# Patient Record
Sex: Female | Born: 1960 | Race: White | Hispanic: No | Smoking: Former smoker
Health system: Southern US, Community
[De-identification: ages and names within clinical notes are randomized; demographics above are authoritative.]

## PROBLEM LIST (undated history)

## (undated) DIAGNOSIS — J449 Chronic obstructive pulmonary disease, unspecified: Secondary | ICD-10-CM

## (undated) DIAGNOSIS — K219 Gastro-esophageal reflux disease without esophagitis: Secondary | ICD-10-CM

## (undated) DIAGNOSIS — M810 Age-related osteoporosis without current pathological fracture: Secondary | ICD-10-CM

## (undated) DIAGNOSIS — M1990 Unspecified osteoarthritis, unspecified site: Secondary | ICD-10-CM

## (undated) DIAGNOSIS — G894 Chronic pain syndrome: Secondary | ICD-10-CM

## (undated) DIAGNOSIS — J302 Other seasonal allergic rhinitis: Secondary | ICD-10-CM

## (undated) DIAGNOSIS — K5090 Crohn's disease, unspecified, without complications: Secondary | ICD-10-CM

## (undated) DIAGNOSIS — D649 Anemia, unspecified: Secondary | ICD-10-CM

## (undated) DIAGNOSIS — K859 Acute pancreatitis without necrosis or infection, unspecified: Secondary | ICD-10-CM

## (undated) DIAGNOSIS — F419 Anxiety disorder, unspecified: Secondary | ICD-10-CM

## (undated) DIAGNOSIS — K625 Hemorrhage of anus and rectum: Secondary | ICD-10-CM

## (undated) DIAGNOSIS — R197 Diarrhea, unspecified: Secondary | ICD-10-CM

## (undated) DIAGNOSIS — Z5189 Encounter for other specified aftercare: Secondary | ICD-10-CM

## (undated) DIAGNOSIS — R1030 Lower abdominal pain, unspecified: Secondary | ICD-10-CM

## (undated) DIAGNOSIS — R1110 Vomiting, unspecified: Secondary | ICD-10-CM

## (undated) DIAGNOSIS — R630 Anorexia: Secondary | ICD-10-CM

## (undated) HISTORY — DX: Anorexia: R63.0

## (undated) HISTORY — DX: Age-related osteoporosis without current pathological fracture: M81.0

## (undated) HISTORY — DX: Chronic pain syndrome: G89.4

## (undated) HISTORY — PX: ABDOMINAL HYSTERECTOMY: SHX81

## (undated) HISTORY — DX: Unspecified osteoarthritis, unspecified site: M19.90

## (undated) HISTORY — PX: SMALL INTESTINE SURGERY: SHX150

## (undated) HISTORY — PX: OTHER SURGICAL HISTORY: SHX169

## (undated) HISTORY — DX: Diarrhea, unspecified: R19.7

## (undated) HISTORY — PX: TUBAL LIGATION: SHX77

## (undated) HISTORY — PX: CHOLECYSTECTOMY: SHX55

## (undated) HISTORY — DX: Anemia, unspecified: D64.9

## (undated) HISTORY — DX: Hemorrhage of anus and rectum: K62.5

## (undated) HISTORY — DX: Encounter for other specified aftercare: Z51.89

## (undated) HISTORY — DX: Gastro-esophageal reflux disease without esophagitis: K21.9

## (undated) HISTORY — PX: HERNIA REPAIR: SHX51

## (undated) HISTORY — DX: Lower abdominal pain, unspecified: R10.30

## (undated) HISTORY — DX: Crohn's disease, unspecified, without complications: K50.90

## (undated) HISTORY — DX: Anxiety disorder, unspecified: F41.9

## (undated) HISTORY — DX: Vomiting, unspecified: R11.10

---

## 1993-04-19 HISTORY — PX: COLECTOMY: SHX59

## 2001-01-25 ENCOUNTER — Emergency Department (HOSPITAL_COMMUNITY): Admission: EM | Admit: 2001-01-25 | Discharge: 2001-01-26 | Payer: Self-pay | Admitting: *Deleted

## 2001-01-26 ENCOUNTER — Encounter: Payer: Self-pay | Admitting: *Deleted

## 2001-02-06 ENCOUNTER — Ambulatory Visit (HOSPITAL_BASED_OUTPATIENT_CLINIC_OR_DEPARTMENT_OTHER): Admission: RE | Admit: 2001-02-06 | Discharge: 2001-02-06 | Payer: Self-pay | Admitting: Otolaryngology

## 2001-04-07 ENCOUNTER — Encounter: Payer: Self-pay | Admitting: Emergency Medicine

## 2001-04-07 ENCOUNTER — Inpatient Hospital Stay (HOSPITAL_COMMUNITY): Admission: EM | Admit: 2001-04-07 | Discharge: 2001-04-14 | Payer: Self-pay | Admitting: Emergency Medicine

## 2001-04-09 ENCOUNTER — Encounter: Payer: Self-pay | Admitting: General Surgery

## 2001-04-10 ENCOUNTER — Encounter: Payer: Self-pay | Admitting: General Surgery

## 2001-04-14 ENCOUNTER — Encounter: Payer: Self-pay | Admitting: General Surgery

## 2001-11-23 ENCOUNTER — Inpatient Hospital Stay (HOSPITAL_COMMUNITY): Admission: AD | Admit: 2001-11-23 | Discharge: 2001-11-30 | Payer: Self-pay | Admitting: General Surgery

## 2001-11-24 ENCOUNTER — Encounter: Payer: Self-pay | Admitting: General Surgery

## 2001-11-27 ENCOUNTER — Encounter: Payer: Self-pay | Admitting: General Surgery

## 2002-08-21 ENCOUNTER — Encounter: Payer: Self-pay | Admitting: General Surgery

## 2002-08-21 ENCOUNTER — Encounter: Payer: Self-pay | Admitting: Emergency Medicine

## 2002-08-21 ENCOUNTER — Inpatient Hospital Stay (HOSPITAL_COMMUNITY): Admission: EM | Admit: 2002-08-21 | Discharge: 2002-08-27 | Payer: Self-pay | Admitting: Emergency Medicine

## 2002-10-02 ENCOUNTER — Inpatient Hospital Stay (HOSPITAL_COMMUNITY): Admission: EM | Admit: 2002-10-02 | Discharge: 2002-10-09 | Payer: Self-pay | Admitting: Emergency Medicine

## 2002-10-04 ENCOUNTER — Encounter: Payer: Self-pay | Admitting: General Surgery

## 2002-10-08 ENCOUNTER — Encounter: Payer: Self-pay | Admitting: General Surgery

## 2003-01-15 ENCOUNTER — Encounter: Payer: Self-pay | Admitting: General Surgery

## 2003-01-15 ENCOUNTER — Ambulatory Visit (HOSPITAL_COMMUNITY): Admission: RE | Admit: 2003-01-15 | Discharge: 2003-01-15 | Payer: Self-pay | Admitting: General Surgery

## 2004-03-15 ENCOUNTER — Emergency Department (HOSPITAL_COMMUNITY): Admission: EM | Admit: 2004-03-15 | Discharge: 2004-03-15 | Payer: Self-pay | Admitting: Emergency Medicine

## 2004-04-18 ENCOUNTER — Emergency Department (HOSPITAL_COMMUNITY): Admission: EM | Admit: 2004-04-18 | Discharge: 2004-04-18 | Payer: Self-pay | Admitting: Emergency Medicine

## 2004-05-13 ENCOUNTER — Emergency Department (HOSPITAL_COMMUNITY): Admission: EM | Admit: 2004-05-13 | Discharge: 2004-05-13 | Payer: Self-pay | Admitting: Emergency Medicine

## 2004-05-14 ENCOUNTER — Inpatient Hospital Stay (HOSPITAL_COMMUNITY): Admission: EM | Admit: 2004-05-14 | Discharge: 2004-05-21 | Payer: Self-pay | Admitting: Emergency Medicine

## 2004-05-19 ENCOUNTER — Ambulatory Visit: Payer: Self-pay | Admitting: *Deleted

## 2005-11-29 ENCOUNTER — Ambulatory Visit: Payer: Self-pay | Admitting: Family Medicine

## 2005-11-29 LAB — CONVERTED CEMR LAB
TSH: 1.869 microintl units/mL
WBC, blood: 8.3 10*3/uL

## 2005-12-08 ENCOUNTER — Observation Stay (HOSPITAL_COMMUNITY): Admission: RE | Admit: 2005-12-08 | Discharge: 2005-12-10 | Payer: Self-pay | Admitting: General Surgery

## 2005-12-13 ENCOUNTER — Ambulatory Visit: Payer: Self-pay | Admitting: Family Medicine

## 2005-12-14 ENCOUNTER — Ambulatory Visit (HOSPITAL_COMMUNITY): Admission: RE | Admit: 2005-12-14 | Discharge: 2005-12-14 | Payer: Self-pay | Admitting: Family Medicine

## 2005-12-15 ENCOUNTER — Ambulatory Visit (HOSPITAL_COMMUNITY): Admission: RE | Admit: 2005-12-15 | Discharge: 2005-12-15 | Payer: Self-pay | Admitting: Family Medicine

## 2005-12-16 ENCOUNTER — Encounter (HOSPITAL_COMMUNITY): Admission: RE | Admit: 2005-12-16 | Discharge: 2006-01-14 | Payer: Self-pay | Admitting: Family Medicine

## 2005-12-17 ENCOUNTER — Encounter (INDEPENDENT_AMBULATORY_CARE_PROVIDER_SITE_OTHER): Payer: Self-pay | Admitting: Family Medicine

## 2005-12-17 LAB — CONVERTED CEMR LAB: RBC count: 4.31 10*6/uL

## 2005-12-28 ENCOUNTER — Ambulatory Visit: Payer: Self-pay | Admitting: Family Medicine

## 2005-12-30 ENCOUNTER — Ambulatory Visit: Payer: Self-pay | Admitting: Internal Medicine

## 2006-01-03 ENCOUNTER — Ambulatory Visit (HOSPITAL_COMMUNITY): Admission: RE | Admit: 2006-01-03 | Discharge: 2006-01-03 | Payer: Self-pay | Admitting: Internal Medicine

## 2006-01-03 ENCOUNTER — Encounter (INDEPENDENT_AMBULATORY_CARE_PROVIDER_SITE_OTHER): Payer: Self-pay | Admitting: Family Medicine

## 2006-01-03 ENCOUNTER — Ambulatory Visit (HOSPITAL_COMMUNITY): Admission: RE | Admit: 2006-01-03 | Discharge: 2006-01-03 | Payer: Self-pay | Admitting: Family Medicine

## 2006-01-09 ENCOUNTER — Emergency Department (HOSPITAL_COMMUNITY): Admission: EM | Admit: 2006-01-09 | Discharge: 2006-01-09 | Payer: Self-pay | Admitting: Emergency Medicine

## 2006-01-13 ENCOUNTER — Ambulatory Visit: Payer: Self-pay | Admitting: Family Medicine

## 2006-01-17 HISTORY — PX: COLONOSCOPY: SHX174

## 2006-01-21 ENCOUNTER — Ambulatory Visit (HOSPITAL_COMMUNITY): Admission: RE | Admit: 2006-01-21 | Discharge: 2006-01-21 | Payer: Self-pay | Admitting: Internal Medicine

## 2006-01-21 ENCOUNTER — Encounter (INDEPENDENT_AMBULATORY_CARE_PROVIDER_SITE_OTHER): Payer: Self-pay | Admitting: Specialist

## 2006-01-21 ENCOUNTER — Ambulatory Visit: Payer: Self-pay | Admitting: Internal Medicine

## 2006-02-10 ENCOUNTER — Encounter (INDEPENDENT_AMBULATORY_CARE_PROVIDER_SITE_OTHER): Payer: Self-pay | Admitting: Family Medicine

## 2006-02-15 ENCOUNTER — Ambulatory Visit: Payer: Self-pay | Admitting: Internal Medicine

## 2006-02-20 ENCOUNTER — Emergency Department (HOSPITAL_COMMUNITY): Admission: EM | Admit: 2006-02-20 | Discharge: 2006-02-20 | Payer: Self-pay | Admitting: Emergency Medicine

## 2006-03-24 ENCOUNTER — Ambulatory Visit: Payer: Self-pay | Admitting: Internal Medicine

## 2006-03-24 ENCOUNTER — Encounter: Payer: Self-pay | Admitting: Family Medicine

## 2006-03-24 DIAGNOSIS — K449 Diaphragmatic hernia without obstruction or gangrene: Secondary | ICD-10-CM | POA: Insufficient documentation

## 2006-03-24 DIAGNOSIS — M949 Disorder of cartilage, unspecified: Secondary | ICD-10-CM

## 2006-03-24 DIAGNOSIS — M899 Disorder of bone, unspecified: Secondary | ICD-10-CM | POA: Insufficient documentation

## 2006-03-24 DIAGNOSIS — K219 Gastro-esophageal reflux disease without esophagitis: Secondary | ICD-10-CM | POA: Insufficient documentation

## 2006-03-24 DIAGNOSIS — Z8719 Personal history of other diseases of the digestive system: Secondary | ICD-10-CM | POA: Insufficient documentation

## 2006-03-24 DIAGNOSIS — G5600 Carpal tunnel syndrome, unspecified upper limb: Secondary | ICD-10-CM | POA: Insufficient documentation

## 2006-03-24 DIAGNOSIS — K5090 Crohn's disease, unspecified, without complications: Secondary | ICD-10-CM | POA: Insufficient documentation

## 2006-03-24 DIAGNOSIS — M545 Low back pain, unspecified: Secondary | ICD-10-CM | POA: Insufficient documentation

## 2006-03-24 DIAGNOSIS — K589 Irritable bowel syndrome without diarrhea: Secondary | ICD-10-CM | POA: Insufficient documentation

## 2006-03-24 DIAGNOSIS — M129 Arthropathy, unspecified: Secondary | ICD-10-CM | POA: Insufficient documentation

## 2006-03-24 DIAGNOSIS — F17200 Nicotine dependence, unspecified, uncomplicated: Secondary | ICD-10-CM | POA: Insufficient documentation

## 2006-03-31 ENCOUNTER — Encounter (INDEPENDENT_AMBULATORY_CARE_PROVIDER_SITE_OTHER): Payer: Self-pay | Admitting: Family Medicine

## 2006-05-04 ENCOUNTER — Ambulatory Visit: Payer: Self-pay | Admitting: Internal Medicine

## 2006-05-25 ENCOUNTER — Ambulatory Visit: Payer: Self-pay | Admitting: Internal Medicine

## 2006-08-25 ENCOUNTER — Encounter
Admission: RE | Admit: 2006-08-25 | Discharge: 2006-11-23 | Payer: Self-pay | Admitting: Physical Medicine & Rehabilitation

## 2006-08-25 ENCOUNTER — Ambulatory Visit: Payer: Self-pay | Admitting: Physical Medicine & Rehabilitation

## 2006-08-29 ENCOUNTER — Ambulatory Visit (HOSPITAL_COMMUNITY)
Admission: RE | Admit: 2006-08-29 | Discharge: 2006-08-29 | Payer: Self-pay | Admitting: Physical Medicine & Rehabilitation

## 2006-09-26 ENCOUNTER — Ambulatory Visit: Payer: Self-pay | Admitting: Physical Medicine & Rehabilitation

## 2006-11-02 ENCOUNTER — Encounter
Admission: RE | Admit: 2006-11-02 | Discharge: 2007-01-31 | Payer: Self-pay | Admitting: Physical Medicine & Rehabilitation

## 2006-11-03 ENCOUNTER — Ambulatory Visit: Payer: Self-pay | Admitting: Physical Medicine & Rehabilitation

## 2007-01-02 ENCOUNTER — Ambulatory Visit: Payer: Self-pay | Admitting: Physical Medicine & Rehabilitation

## 2007-02-02 ENCOUNTER — Ambulatory Visit: Payer: Self-pay | Admitting: Physical Medicine & Rehabilitation

## 2007-02-02 ENCOUNTER — Encounter
Admission: RE | Admit: 2007-02-02 | Discharge: 2007-05-03 | Payer: Self-pay | Admitting: Physical Medicine & Rehabilitation

## 2007-02-17 ENCOUNTER — Ambulatory Visit: Payer: Self-pay | Admitting: Physical Medicine & Rehabilitation

## 2007-04-11 ENCOUNTER — Ambulatory Visit: Payer: Self-pay | Admitting: Physical Medicine & Rehabilitation

## 2007-04-11 ENCOUNTER — Encounter
Admission: RE | Admit: 2007-04-11 | Discharge: 2007-06-29 | Payer: Self-pay | Admitting: Physical Medicine & Rehabilitation

## 2007-05-19 ENCOUNTER — Encounter
Admission: RE | Admit: 2007-05-19 | Discharge: 2007-05-25 | Payer: Self-pay | Admitting: Physical Medicine & Rehabilitation

## 2007-05-19 ENCOUNTER — Ambulatory Visit: Payer: Self-pay | Admitting: Physical Medicine & Rehabilitation

## 2007-05-24 ENCOUNTER — Ambulatory Visit: Payer: Self-pay | Admitting: Physical Medicine & Rehabilitation

## 2007-08-12 ENCOUNTER — Emergency Department (HOSPITAL_COMMUNITY): Admission: EM | Admit: 2007-08-12 | Discharge: 2007-08-12 | Payer: Self-pay | Admitting: Emergency Medicine

## 2008-05-08 ENCOUNTER — Ambulatory Visit (HOSPITAL_COMMUNITY): Admission: RE | Admit: 2008-05-08 | Discharge: 2008-05-08 | Payer: Self-pay | Admitting: Pulmonary Disease

## 2009-06-13 ENCOUNTER — Ambulatory Visit (HOSPITAL_COMMUNITY): Admission: RE | Admit: 2009-06-13 | Discharge: 2009-06-13 | Payer: Self-pay | Admitting: Internal Medicine

## 2010-01-08 LAB — BASIC METABOLIC PANEL: Creatinine: 0.7 mg/dL (ref 0.5–1.1)

## 2010-01-08 LAB — CBC AND DIFFERENTIAL
HCT: 38 % (ref 36–46)
Hemoglobin: 13.3 g/dL (ref 12.0–16.0)
Platelets: 202 10*3/uL (ref 150–399)
WBC: 5.5 10^3/mL

## 2010-05-09 ENCOUNTER — Encounter: Payer: Self-pay | Admitting: Pulmonary Disease

## 2010-05-09 ENCOUNTER — Encounter: Payer: Self-pay | Admitting: General Surgery

## 2010-05-10 ENCOUNTER — Encounter: Payer: Self-pay | Admitting: Pulmonary Disease

## 2010-05-19 NOTE — Letter (Signed)
Summary: Imaging Reports  Imaging Reports   Imported By: Wyatt Mage LPN 00/92/3300 76:22:63  _____________________________________________________________________  External Attachment:    Type:   Image     Comment:   External Document

## 2010-05-19 NOTE — Letter (Signed)
Summary: Misc  Misc   Imported By: Wyatt Mage LPN 44/46/1901 22:24:11  _____________________________________________________________________  External Attachment:    Type:   Image     Comment:   External Document

## 2010-05-19 NOTE — Letter (Signed)
Summary: Flowsheets  Flowsheets   Imported By: Wyatt Mage LPN 96/22/2979 89:21:19  _____________________________________________________________________  External Attachment:    Type:   Image     Comment:   External Document

## 2010-05-19 NOTE — Letter (Signed)
Summary: Medical History Form, Medication List  Medical History Form, Medication List   Imported By: Wyatt Mage LPN 57/84/6962 95:28:41  _____________________________________________________________________  External Attachment:    Type:   Image     Comment:   External Document

## 2010-05-19 NOTE — Letter (Signed)
Summary: Lab Reports  Lab Reports   Imported By: Wyatt Mage LPN 19/59/7471 85:50:15  _____________________________________________________________________  External Attachment:    Type:   Image     Comment:   External Document

## 2010-05-19 NOTE — Letter (Signed)
Summary: New Patient Screening Form  New Patient Screening Form   Imported By: Wyatt Mage LPN 85/63/1497 02:63:78  _____________________________________________________________________  External Attachment:    Type:   Image     Comment:   External Document

## 2010-05-19 NOTE — Letter (Signed)
Summary: Vital Signs  Vital Signs   Imported By: Wyatt Mage LPN 73/22/0254 27:06:23  _____________________________________________________________________  External Attachment:    Type:   Image     Comment:   External Document

## 2010-05-19 NOTE — Letter (Signed)
Summary: [facility] Office Visits  [facility] Office Visits   Imported By: Wyatt Mage LPN 91/05/8900 28:40:69  _____________________________________________________________________  External Attachment:    Type:   Image     Comment:   External Document

## 2010-09-01 NOTE — Assessment & Plan Note (Signed)
Ms. [name] returns today.  She was originally scheduled for right lumbar  radiofrequency procedure.  She underwent a left lumbar radiofrequency  procedure April 17, 2007.  She has had some relief of her left-sided  low back pain.  Her right-sided pain is really not so bad.   She rates her pain a 6/10 on average and with activity around 6/10.   INTERVAL HISTORY:  Has had dental infection.  She has had antibiotics  and has been off them for weeks.  Since she has been off the antibiotics  her dental pain has increased again.  She can walk 5-10 minutes at a  time, she can climb steps, she drives.   REVIEW OF SYSTEMS:  Positive for numbness, tingling, trouble walking,  depression, anxiety, nausea, vomiting, diarrhea.  She sees Dr. Luan Pulling,  Dr. Laural Golden for her medical issues.  She smokes 5-7 cigarettes per day.   EXAMINATION:  GENERAL:  No acute distress.  Mood and affect appropriate.  Gait is normal.  No evidence of toe drag or knee instability.  Her blood  pressure is 114/74, pulse 51, respirations 16, O2 sat 100% on room air.   IMPRESSION:  Improvement in lumbar axial pain.  She did not get quite as  much relief as she did with medial branch blocks and if she had a repeat  procedure would likely need to do two burns per level.   Certainly, she does not need a repeat procedure at this point but may  need repeat in another 6-12 months.  I do not think she needs a right-  sided procedure done today and, in fact, given her dental issues do not  think she should have an injection today.  She will follow up with Dr.  Naaman Plummer for her general pain management.      Charlett Blake, M.D.  Electronically Signed     AEK/MedQ  D:  05/25/2007 11:54:55  T:  05/26/2007 10:50:57  Job #:  [id number]

## 2010-09-01 NOTE — Procedures (Signed)
[name], [name]                 ACCOUNT NO.:  [id number]   MEDICAL RECORD NO.:  [id number]          PATIENT TYPE:  REC   LOCATION:  [facility]                          FACILITY:  [facility]   PHYSICIAN:  Charlett Blake, M.D.DATE OF BIRTH:  Oct 24, 1960   DATE OF PROCEDURE:  04/17/2007  DATE OF DISCHARGE:                               OPERATIVE REPORT   PROCEDURE:  Left L5 dorsal ramus injection and radiofrequency neurotomy,  left L4; medial branch block and radiofrequency neurotomy, left L3;  medial branch block and radiofrequency neurotomy under fluoroscopic  guidance.   INDICATIONS:  Lumbar facet mediated pain as demonstrated by positive  results with medial branch blocks at corresponding levels on two  different occasions, giving 50% or better relief of pain lasting days.  Her pain is only partially responsive to medication management including  narcotic analgesic medications and interferes with household duties as  well as walking and bending and prolonged sitting.   Informed consent was obtained after describing the risks and benefits of  the procedure to the patient.  These include bleeding, bruising,  infection, as well as neuritis.  She elects to proceed and has given  written consent.   The patient placed prone on fluoroscopy table.  Betadine prep, sterile  drape.  25-gauge 1-1/2-inch needle was used to anesthetize the skin and  subcu tissue with 1% lidocaine x2 mL at each site.  Then a 20-gauge 10-  cm RF needle with 10 mm curved active tip was inserted under  fluoroscopic guidance targeting left S1 SAP sacral ala junction.  Bone  contact made, confirmed with lateral imaging.  Sensory stem at 50 Hz  followed by motor stem at 2 Hz demonstrated appropriate needle location  followed by injection of a solution containing 1 mL of 4 mg/mL  dexamethasone and 2 mL of 1% MPF lidocaine followed by RF lesioning at  70 degrees x 70 seconds, and then the left L5 SAP transverse process  junction targeted, bone contact made, confirmed with lateral imaging.  Sensory stem at 50 Hz followed by motor stem at 2 Hz demonstrated  appropriate needle location followed by injection of 1 mL of the  dexamethasone lidocaine solution and RF lesioning 70 degrees x 70  seconds, and then the left L4 SAP transverse process junction targeted,  bone contact made, confirmed with lateral imaging.  Sensory stem at 50  Hz followed by motor stem at 2 Hz demonstrated proper needle location  followed by injection of 1 mL of the dexamethasone lidocaine solution  and RF lesioning at 70 degrees Celsius.   The patient tolerated procedure well.  Pre and post-injection vitals  stable.  Postprocedure instructions given.  Return in 1 month to do the  right side.      Charlett Blake, M.D.  Electronically Signed     AEK/MEDQ  D:  04/17/2007 11:10:08  T:  04/17/2007 11:50:11  Job:  [id number]   cc:   Meredith Staggers, M.D.  Fax: [phone]

## 2010-09-01 NOTE — Assessment & Plan Note (Signed)
The patient returns today.  She was to receive a medial branch block;  however, we did one on December 12, 2006, and she has had continued good  improvement.  She was initially at an 8 out of 10 pre-injection,  immediate post injection 5 out of 10, but now is only 2 out of 10. She  has been increasing her activity and quite pleased with the overall  results.   MEDICATIONS:  Oxy-Contin 40 mg b.i.d.  She has not reduced this at all.  She states that other parts of her body hurt and that is why she cannot  reduce her medication.   Her pain with activity is about a 5 out of 10, she can walk 10 to 15  minutes at a time.  She drives, she needs some help with household  duties.   REVIEW OF SYSTEMS:  Positive for numbness, depression, abdominal pain,  poor appetite, diarrhea, nausea, and night sweats.  She sees Dr. Doristine Counter as well as Dr. Luan Pulling for her other medical issues.   PHYSICAL EXAMINATION:  GENERAL:  In no acute distress, mood and affect  appropriate.  VITAL SIGNS:  Blood pressure is 132/65, pulse 71, respirations 18, O2  SAT 97% on room air.  PAIN AND REHAB EVALUATION:  Her back has tenderness to palpation only  with extension of her spine, with forward flexion she gets down to about  her ankles.  She has normal deep tendon reflexes and normal strength,  she has decreased internal and external rotation of the right hip, the  left side is normal, she has no evidence of knee effusion or crepitus.  Her gait shows no toe drag or knee instability.   IMPRESSION:  Lumbar facet arthropathy improved with medial branch  blocks.  She has had about three weeks of relief thus far and, as I  discussed with the patient, it is difficult to say how long it is going  to last her.  I asked her to follow with Dr. Naaman Plummer next month.  I have  refilled her Oxy-Contin 40 b.i.d. and encouraged her to increase her  activity such as doing more walking, which she only does about 10 to 15  minutes at a  time at the current time.  If she has recurrence of pain,  will repeat her medial branch blocks.      Charlett Blake, M.D.  Electronically Signed     AEK/MedQ  D:  01/02/2007 14:38:16  T:  01/02/2007 20:04:57  Job #:  [id number]

## 2010-09-01 NOTE — Assessment & Plan Note (Signed)
[name] returns today for RF.  She had a bilateral L5 dorsal ramus  ejection, bilateral L4 medial branch block, bilateral L3 medial branch  block February 20, 2007, but has continued improvement of pain.  She was  9/10 pre; post is 5/10, and she is still at a 4/10 pain, despite being  very active with her grandchildren this weekend.  She has had no new  medical problems in the interval time.   REVIEW OF SYSTEMS:  Positive for numbness, tingling of the left lower  extremity.  She has Lyrica that she takes for this per Dr. Ephriam Knuckles.  She  has depression, anxiety, but negative suicidal thoughts.   She states that she does not require any refills on her medicines.  She  is currently taking OxyContin 40 mg b.i.d., Lyrica 75 t.i.d.   EXAMINATION:  Lower extremity reflexes are normal.  Lower extremity  range of motion is normal hip, knees and ankles.  Strength is normal in  hip, knees and ankles.  Her back tenderness to palpation to lumbosacral  junction, but this is mild.  She has pain mainly with extension, and has  about 50% range.  Forward flexion is 100% range without pain.  Gait is  normal.   IMPRESSION:  Lumbar facet arthropathy.  She has had a prolonged effect  from the lumbar medial branch block, and no reason to repeat  radiofrequency today.  We will see how long this lasts her.  Typically,  even with a prolonged effect, we are talking about a couple months  duration.   I will see her back in about a month.  Tentatively schedule for  radiofrequency.      Charlett Blake, M.D.  Electronically Signed     AEK/MedQ  D:  03/20/2007 09:35:00  T:  03/20/2007 10:17:32  Job #:  [id number]

## 2010-09-01 NOTE — Assessment & Plan Note (Signed)
REASON FOR VISIT:  Originally was scheduled for medial branch blocks,  left L3-4, L4-5, however patient did not bring her driver today, she  wanted more information and to know whether or not this indeed would  help not only her back pain but pain going down into her hip and thigh  area. She rates her pain in these areas currently at a 10 out of 10,  however she has been out of her OxyContin for 2 days. No significant  withdrawal however. Her pain increases with activity such as walking,  bending, and prolonged sitting. Interferes with the enjoyment of life.  Her pain is worse morning, evening, and night, better during the  daytimes. Sleep is poor. Relief from meds is good.   REVIEW OF SYSTEMS:  Positive for night sweats, and bowel problems with  alternating diarrhea and constipation, occasional abdominal pain, sees  gastroenterologist.   SOCIAL HISTORY:  Single, smokes half pack per day.   PHYSICAL EXAMINATION:  Blood pressure 130/74, pulse 114, respirations  18, O2 sats 96% room air.   Thin female appearing somewhat older than stated age, anxious but  otherwise appropriate. Her back is tender mainly with extension of the  lumbar spine and this is mainly on the left side. Her hip range of  motion is within functional limits bilaterally. Her deep tendon reflexes  are normal. Her strength is normal in the lower extremities.   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   PLAN:  1. I discussed the procedure with the patient including the use of      spine model and went over her symptoms, as well as her exacerbating      factors. She would like to proceed with medial branch blocks but we      will have to schedule her back in approximately 1 week at the least      so she can get a driver.  2. She is out of her OxyContin. Her last prescription was October 03, 2006. I have written another 1 for OxyContin CR 40 mg b.i.d. as per      Dr. Charm Barges  previous.  3. I have given her valium 10 mg to take prior to the injection. I      have also gone over the risks and benefits of the procedure, as      well as avoiding nonsteroidals, and not scheduling if she has a      current infection.   I will see her back for in injection and she will continue to follow up  with Dr. Naaman Plummer in regards to her overall pain management.      Charlett Blake, M.D.  Electronically Signed     AEK/MedQ  D:  11/03/2006 13:54:40  T:  11/04/2006 09:58:03  Job #:  [id number]   cc:   Meredith Staggers, M.D.  Fax: [phone]

## 2010-09-01 NOTE — Assessment & Plan Note (Signed)
[name] is back regarding her pain. She states that overall her pain is  worse with pain diffusely throughout the ankles, knees, hips, elbows,  hands, particularly right shoulder. She reports problem with depression,  problems with sleep, etc.  She did find that the medial branch blocks  were very beneficial although pain relief only lasted a few weeks.  The  patient rates her pain as 6 to 8/10 described as sharp, burning, dull,  stabbing, constant, aching.  The pain interferes with general activity,  relations with others and enjoyment of life on a mild to moderate level.  The pain generally worsens with activity and generally she notes it  throughout the day.  She does use OxyContin 40 mg every 12 hours for her  baseline pain control.   REVIEW OF SYSTEMS:  Does have trouble walking, bowel control problems,  abdominal pain, nausea, diarrhea, night sweats, depression.  Other  pertinent positives are above.   SOCIAL HISTORY:  The patient is single.  Still smoking 1 pack of  cigarettes every 3 days. She lives alone.   PHYSICAL EXAMINATION:  VITAL SIGNS:  Blood pressure 98/61.  Pulse 76.  Respiratory rate 18.  She is sating 99 percent on room air.  GENERAL:  The patient is pleasant, alert and oriented x3.  Affect is  bright and generally appropriate.  MUSCULOSKELETAL:  She has multiple tender areas along the ankles, knees,  hips, low back, chest, right shoulder, bilateral elbows, and wrist.  Neck is non-tender today.  Reflexes are 2+. Strength is generally intact  except for some pain inhibition.  She is able to flex at the low back to  approximately 60 to 70 degrees. Extension causes pain at 5 degrees with  positive facet maneuvers noted.  Mood was fair.  Cognition was  appropriate.   ASSESSMENT:  1. Lumbar facet arthropathy, improved after medial branch block.  2. I feel that this patient may in fact have fibromyalgia syndrome.      Her pain in multiple areas is out of proportion to her  clinical      appearance and history.   PLAN:  1. Will begin a trial of Lyrica 75 mg t.i.d. titrating up to that over      2 weeks time.  2. Will send the patient back for radio frequency ablation per Dr.      Letta Pate, as I think she will highly benefit from these.  3. Refilled her OxyContin for baseline pain control.  4. I will see her back pending injections per Dr. Maryland Pink.      Meredith Staggers, M.D.  Electronically Signed     ZTS/MedQ  D:  02/03/2007 16:26:51  T:  02/05/2007 14:01:15  Job #:  [id number]   cc:   Percell Miller L. Luan Pulling, M.D.  Fax: [phone]

## 2010-09-01 NOTE — Procedures (Signed)
[name], [name]                 ACCOUNT NO.:  [id number]   MEDICAL RECORD NO.:  [id number]          PATIENT TYPE:  REC   LOCATION:  [facility]                          FACILITY:  [facility]   PHYSICIAN:  Charlett Blake, M.D.DATE OF BIRTH:  [birth year]   DATE OF PROCEDURE:  DATE OF DISCHARGE:                               OPERATIVE REPORT   PROCEDURE:  Medial branch blocks L5, dorsal ramus injection L4, medial  branch blocks L3, medial branch block bilateral.   INDICATIONS:  Lumbar pain.  She indicates bilateral pain.   The pain is only partially responsive to medication management including  OxyContin.   The pain is also interfering with certain household duties.   Informed consent was obtained after describing the risks and benefits of  the procedure to the patient. These include bleeding, bruising,  infection, loss of bowel or bladder function, temporary or permanent  paralysis.  She elects to proceed and has given written consent. The  patient placed supine on the fluoroscopy table, Betadine prep, sterile  drape, 25-gauge inch and a half needle was used to anesthetize the skin  and subcu tissue. 1% lidocaine x3 mL and a 22 gauge 3.5 inch spinal  needle was inserted under fluoroscopic guidance first starting at left  S1 SAP to the sacral ala junction bone contact made confirmed with  lateral imaging.  Omnipaque 180 x 0.5 mL demonstrated no intravascular  uptake and 0.5 mL of dexamethasone lidocaine solution injected.   Left L5 SAP transverse process junction targeted bone contact made  confirmed with lateral imaging.  Omnipaque 180 x 0.5 mL demonstrated no  intravascular take and 0.5 mL of dexamethasone lidocaine solution was  injected then left L4 SAP transverse process junction targeted, bone  contact made and confirmed with lateral imaging. Omnipaque 180 x 0.5 mL  demonstrated no intravascular uptake and 0.5 mL of dexamethasone  lidocaine solution injected.  This same procedure  was repeated on the  right side with same injectate, same technique, same equipment.  The  patient tolerated the procedure well.  Preinjection pain level 8/10,  post injection pain level  5/10.  Will track pain over time and looking  for at least 50% reduction and would need to repeat medial branch blocks  in 3-4 weeks with positive response.      Charlett Blake, M.D.  Electronically Signed     AEK/MEDQ  D:  12/12/2006 10:08:49  T:  12/12/2006 15:47:31  Job:  [id number]

## 2010-09-01 NOTE — Assessment & Plan Note (Signed)
[name] is back regarding her back pain and left hip pain.  I had x-rays of  her back and hip done which reveal no acute abnormalities in the back or  hip.  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  There was left iliac lucency which has not changed or  possibly improved since her last study done.  We tried a few measures  including Levsin for her abdominal spasm which seems to have helped.  She was also given Lidoderm patches for her low back which were  beneficial as well.  She then did not get to therapy due to some family  complications and stressors which had consumed her time.  Her most  prominent pain today is her low back and left hip to a certain extent as  well as the left leg.  She rates her pain on average at a 7 to 8/10.  She describes it as aching and constant.  It seems to be worse when she  stands and walks and sometimes bending, too.  The pain interferes with  general activity, relationship with others, and enjoyment of life on a  moderate level.   REVIEW OF SYSTEMS:  Notable for intermittent depression and abdominal  symptoms as mentioned above. Full review is in the written Health and  History section on the chart.   SOCIAL HISTORY:  The patient lives alone and smokes 1/2 pack of  cigarettes per day.   PHYSICAL EXAMINATION:  VITAL SIGNS:  Blood pressure 99/51, pulse 62,  respiratory rate17.  She is saturating 96% on room air.  GENERAL:  The patient is pleasant, alert and oriented x3.  Affect is  bright and appropriate. Gait was generally stable.  Low back is painful  on lumbar facets, particularly at L3-L4, L4-L5 with the back maneuvers  positive at those levels.  Flexion caused some discomfort but not nearly  as much today.  She could bend to 75-80 degrees before pain set in.  Extension was achieved to approximately 10 degrees before significant  pain.  Legs were 4+-5/5 strength, normal reflexes, and intact sensation.  Cognitively, the  patient is intact.  HEART:  Regular rate and rhythm.  LUNGS:  Clear.  ABDOMEN:  Soft, nontender.   ASSESSMENT:  1. Chronic Crohn's disease with abdominal pain.  2. Lumbar spondylosis and facet arthropathy with predominantly left-      sided symptoms on exam.   PLAN:  1. We will set patient up with Dr. Letta Pate for left-sided L3-L4, L4-      L5 medial branch blocks.  2. Use of OxyContin 40 mg q. 12 h for baseline pain control.  3. Would to like to send patient to outpatient physical therapy once      again once the injections are completed to improve range of motion      and cord muscle strength in the lumbar spine.  4. Obtain injection report from Dr. Unice Bailey office.  5. Consider rheumatologic screen at some point.  6. Levsin 0.125 mg q. 6 h p.r.n. as well as dicyclomine.  7. I will see the patient back pending injections with Dr. Letta Pate.      Meredith Staggers, M.D.  Electronically Signed     ZTS/MedQ  D:  10/03/2006 14:46:15  T:  10/03/2006 20:36:58  Job #:  [id number]   cc:   Percell Miller L. Luan Pulling, M.D.  Fax: [phone]

## 2010-09-01 NOTE — Procedures (Signed)
[name], [name]                 ACCOUNT NO.:  [id number]   MEDICAL RECORD NO.:  [id number]           PATIENT TYPE:   LOCATION:                                 FACILITY:   PHYSICIAN:  Charlett Blake, M.D.DATE OF BIRTH:  02-21-1961   DATE OF PROCEDURE:  02/20/2007  DATE OF DISCHARGE:                               OPERATIVE REPORT   PROCEDURE:  Bilateral L5 dorsal ramus injection, bilateral L4 medial  branch block, bilateral L3 medial branch block under fluoroscopic  guideance.   INDICATION:  Lumbar pain rated as an 8-9/10, interfering with sleep,  interfering with ambulation.  She has had previous greater than 80%  relief with lumbar medial branch blocks done in August.  She is here for  repeat.   Informed consent was obtained after describing risks and benefits of the  procedure to the patient; these include bleeding, bruising, infection,  temporary or permanent paralysis.  She elects to proceed and has given  written consent.  The patient placed prone on fluoroscopy table.  Betadine prep, sterile drape, 25-gauge, 1-1/2-inch needle was used to  incise skin and subcu tissue.  Then 1% lidocaine 2 mL and a 22-gauge, 3-  1/2-inch spinal needle was inserted under fluoroscopic guidance first  starting at the left S1-2 sacral ala junction.  Bone contact made  confirmed by lateral imaging.  Omnipaque 180 x 0.5 mL demonstrated no  intravascular uptake and 0.5 mL of a solution containing 1 mL of 4 mg/mL  Depo-Medrol and 2 mL of 2% MPF lidocaine were injected.   Then the left L5-SAP transverse process junction targeted, bone contact  made confirmed with lateral imaging.  Omnipaque 180 x 0.5 mL  demonstrated no intravascular uptake and 0.5 mL of Depo-Medrol lidocaine  solution injected in the left L4 SAP transverse junction, targeted bone  contact made confirmed with lateral imaging.  Omnipaque 180 x 0.5 mL  demonstrated no intravascular uptake and 0.5 mL of Depo-Medrol lidocaine  solution was injected.   The same procedure was repeated on the right side with the same  equipment injecting technique, at corresponding levels.  The patient  tolerated the procedure well.  Pre-and-post injection vitals stable.  Pre-injection 9/10 and post-injection 5/10.  Will continue to monitor  pain levels at greater than 50% reduction.  Will schedule for RF first  on the right side, then on the left.      Charlett Blake, M.D.  Electronically Signed     AEK/MEDQ  D:  02/20/2007 11:21:31  T:  02/21/2007 08:31:17  Job:  [id number]   cc:   Percell Miller L. Luan Pulling, M.D.  Fax: [phone]

## 2010-09-04 NOTE — H&P (Signed)
NAMENAISHA, WISDOM                           ACCOUNT NO.:  [id number]   MEDICAL RECORD NO.:  [id number]                   PATIENT TYPE:  INP   LOCATION:  A306                                 FACILITY:  [facility]   PHYSICIAN:  Vernon Prey. Tamala Julian, M.D.                DATE OF BIRTH:  08-12-1960   DATE OF ADMISSION:  10/02/2002  DATE OF DISCHARGE:                                HISTORY & PHYSICAL   [name] admitted for treatment of severe abdominal pain with  evidence of pancreatitis.  The patient gives a history of the onset of vague  upper abdominal pain with bloating and fullness on the evening of May 12.  The pain became more intense the following day which was Sunday.  She  developed nausea with episodic vomiting.  This became even more severe and  started radiating through to her back on the 15th.  The patient states that  because of the severity of her pain and nausea that she sought attention in  the emergency room.  When seen in the emergency room the patient had  evidence of an acute abdomen with diffuse rigidity of her upper abdomen.  Vital signs were stable except for mild orthostatic changes.  Labs reveal  elevated amylase and lipase.  Patient is admitted with acute pancreatitis.  She was recently admitted to the hospital on May 4 - May 10.  At that time  she had evidence of pancreatitis with amylase increasing to 494 and lipase  of 368.  Her levels returned to normal over three days and she was  discharged home in satisfactory condition on the morning of the sixth  hospital day.  The only abnormal findings during that hospitalization was a  triglyceride of 404.  Her total cholesterol was 161 with an HCL of 45.  The  LDL could not be calculated because of the high triglyceride.  CT of the  abdomen done on that admission only showed evidence of fluid surrounding the  body and tail of the pancreas with edematous changes in the region of the  body and tail.  This is  compatible with acute pancreatitis.  Common bile  ducts were interpreted as normal without evidence of intrahepatic  dilatation.  The liver, spleen, kidneys and adrenal glands were normal.  The  patient did have some CT evidence of thickening of the terminal ileum  compatible with her known history of Crohn's disease.   PAST HISTORY:  1. The patient has a history of Crohn's disease dating back to 3.  2. She has diffuse multifocal joint inflammation related to her Crohn's     disease with acute arthritis.  3. There is a history of anemia of chronic disease.  4. Chronic anxiety with depression.   SURGERY:  1. Cholecystectomy.  2. Right colectomy with terminal ileal resection.  3. Sigmoid colectomy.  4. Distal transverse colectomy.  5. Partial colectomy with small bowel resection.  6. Bilateral tubal ligation.  7. Total abdominal hysterectomy.  8. Repair of anovaginal fistula in 1995 and 2001.   MEDICATIONS:  1. Avinza 30 mg daily.  2. Xanax 0.5 mg b.i.d.  3. Protonix 40 mg daily.  4. Prednisone 10 mg t.i.d.  5. Premarin 1.25 mg daily.  6. Multivitamin one daily.  7. Aspirin 81 mg before niacin.  8. Niacin 1 gram daily.   DIET:  She is on a low-fat, low-cholesterol diet.   SOCIAL HISTORY:  Reveals that she is disabled, married.  She smokes 1-2  packs of cigarettes per day.  She does not drink or use drugs.   FAMILY HISTORY:  Positive for atherosclerotic heart disease.   PHYSICAL EXAMINATION:  GENERAL:  She is pale, fairly-young female with  enlargement of her cheeks with the appearance of a moon facies.  She is not  in acute distress at this time.  VITAL SIGNS:  Blood pressure is 120/70, pulse 60, respirations 20,  temperature 98 degrees.  HEENT:  Unremarkable.  NECK:  Supple.  No tenderness or bruit.  No adenopathy or thyromegaly.  CHEST:  Clear to auscultation.  HEART:  Regular rate and rhythm without murmur, gallop or rub.  ABDOMEN:  Distended with moderate  epigastric, left upper quadrant and right  lower quadrant tenderness.  She has significant guarding in the epigastrium  and upper quadrant.  EXTREMITIES:  She has no acute joint inflammation.  There is no evidence of  peripheral edema.  There is no cyanosis or clubbing.  NEUROLOGIC:  No motor or sensory deficit.  Patent stance are intact.  Romberg is negative.   IMPRESSION:  1. Acute recurrent pancreatitis.  2. Hyperlipidemia.  3. Crohn's disease.  4. Chronic anemia.  5. Osteoarthritis.  6. Depression with anxiety.  7. Chronic anovaginal fistula by history.   PLAN:  The patient will be admitted.  We will repeat her lipid profile.  I  will add Lopid 600 mg twice daily and maintain her on clear liquids until  her amylase and lipase are down and then a low-fat diet.  Upper abdominal  ultrasound will be done with special attention to the common bile duct and  the pancreas.  She will receive analgesics and antiemetics on a p.r.n.  basis.                                                Vernon Prey. Tamala Julian, M.D.    LCS/MEDQ  D:  10/03/2002  T:  10/03/2002  Job:  [id number]

## 2010-09-04 NOTE — Consult Note (Signed)
[name], [name]                 ACCOUNT NO.:  [id number]   MEDICAL RECORD NO.:  [id number]           PATIENT TYPE:   LOCATION:  A303                          FACILITY:  [facility]   PHYSICIAN:  Casilda Carls, M.D.         DATE OF BIRTH:   DATE OF CONSULTATION:  05/20/2004  DATE OF DISCHARGE:                                   CONSULTATION   INDICATIONS FOR CONSULTATION:  Low TSH.   REQUESTING PHYSICIAN:  Barrie Folk. Hill, MD   HISTORY:  The patient is a 50 year old female who has been admitted because  of her flare up of Crohn's disease.  The the patient is complaining also  about heat intolerance, being tired, having loose stool and difficulty to  sleep at times.  She denies any history of hyper-or-hypothyroidism disease  in the past.  However, the patient admits that her heart rate has been  running low lately.  The patient has had blood work in the hospital which  showed her T3 uptake was 37.5 with free T4 of 0.79 and a TSH of 0.6.   PAST MEDICAL HISTORY:  1.  Crohn's disease.  2.  Pancreatitis.  3.  Gastroesophageal reflux disease.  4.  Depression and anxiety.  5.  Osteoarthritis.  6.  A history of anovaginal fistula.   SOCIAL HISTORY:  The patient is married.  She does not drink.  She does not  take any drugs.  She smokes about 2 pack of cigarettes daily for several  years.   MEDICATIONS:  1.  Prednisone 10 mg twice daily.  2.  Imodium 2.4 every 2-3 hours as needed.  3.  Valium 10 mg 3 times daily.  4.  OxyContin 20 mg twice daily.  5.  Phenergan 25 mg q.4h. as needed.  6.  Prevacid 30 mg daily.   PHYSICAL EXAMINATION:  GENERAL:  The patient does not appear to be in acute  distress.  She is not jaundiced.  VITAL SIGNS:  Her temperature is 97.8, heart rate 72, respiratory rate 16,  blood pressure 128/65.  HEENT:  Pupils equal, round, reactive to light.  EOMI.  She does not have  any exophthalmos, __________ or stare.  NECK:  Thyroid is not significantly enlarged.  No  nodules no bruits.  LUNGS:  Clear.  No wheezing no rhonchi.  HEART:  No murmurs, no gallops.  ABDOMEN:  Soft, no tenderness.  Active bowel sounds present.  Negative for  liver or spleen enlargement.  EXTREMITIES:  Negative for peripheral edema, clubbing, or cyanosis.  Reflexes are symmetrical and normal.  Peripheral pulses are present.  NEUROLOGIC:  She does not have any neurological deficit.   ASSESSMENT AND PLAN:  The patient is coming in with symptoms of  hyperthyroidism with also a low TSH; however, her free T3 is low.  Her  combination of symptoms could be related to a recent thyroiditis or  interfering of thyroid antibodies with a TSH assay.  Her symptoms could also  be possibly related to menopausal symptoms.  However, I am going to schedule  her for  a thyroid uptake and scan which could be done as an inpatient or  outpatient and the patient should have a followup with me as an outpatient.       ___________________________________________     Hillard Danker  D:  05/20/2004  T:  05/20/2004  Job:  [id number]

## 2010-09-04 NOTE — Discharge Summary (Signed)
[name], HELMES NO.:  [id number]   MEDICAL RECORD NO.:  [id number]          PATIENT TYPE:  OBV   LOCATION:  Y865                          FACILITY:  [facility]   PHYSICIAN:  Jamesetta So, M.D.  DATE OF BIRTH:  April 25, 1960   DATE OF ADMISSION:  12/08/2005  DATE OF DISCHARGE:  08/24/2007LH                                 DISCHARGE SUMMARY   HOSPITAL COURSE:  The patient is a 50 year old white female with Crohn's  disease who has had multiple abdominal surgeries in the past and presents  with an incisional hernia.  She underwent incisional herniorrhaphy with MTF  graft mesh on 12/08/2005.  She tolerated the procedure well.  Postoperative  course was remarkable for incisional pain.  She was also brought to hospital  for high dose steroid therapy.  Her diet was advanced without difficulty.  She is being discharged home on postoperative day #2 in good improving  condition.   DISCHARGE INSTRUCTIONS:  The patient is to follow up with Dr. Aviva Signs  on 12/21/2005.   DISCHARGE MEDICATIONS:  1. Dilaudid 2-4 mg p.o. q.4 h p.r.n. pain, dispense 40, no refills.  2. She is to resume all her medications as previously prescribed.   PRINCIPAL DIAGNOSIS:  1. Incisional hernia.  2. Crohn's disease.  3. Narcotic dependence.   PRINCIPAL PROCEDURE:  Incisional herniorrhaphy with mesh on 12/08/2005.      Jamesetta So, M.D.  Electronically Signed     MAJ/MEDQ  D:  12/10/2005  T:  12/10/2005  Job:  [id number]   cc:   Jac Canavan, M.D.

## 2010-09-04 NOTE — Op Note (Signed)
[name], [name]                 ACCOUNT NO.:  [id number]   MEDICAL RECORD NO.:  [id number]          PATIENT TYPE:  AMB   LOCATION:  DAY                           FACILITY:  [facility]   PHYSICIAN:  Hildred Laser, M.D.    DATE OF BIRTH:  September 19, 1960   DATE OF PROCEDURE:  01/21/2006  DATE OF DISCHARGE:  01/21/2006                                 OPERATIVE REPORT   PROCEDURE:  Colonoscopy with ileoscopy.   INDICATION:  [name] is a 50 year old Caucasian female with history of Crohn  disease who has undergone at least four bowel surgeries but was still having  abdominal pain, diarrhea and remains on prednisone.  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  She is undergoing colonoscopy to find out if she has colonic disease and  also she is undergoing diagnostic colonoscopy along with biopsies from small  bowel and also from colon if indicated.  Procedure risks were reviewed.  The  patient's informed consent was obtained.   MEDICATIONS FOR CONSCIOUS SEDATION:  Demerol 50 mg IV, Versed 4 mg IV.   FINDINGS:  Procedure performed in endoscopy suite.  The patient's vital  signs and O2 sat were monitored during the procedure and remained stable.  The patient was placed in left lateral recumbent position.  Rectal  examination performed.  She notice some discomfort on digital exam but no  stricture was noted.  Pediatric Olympus videoscope was placed in the rectum  and advanced under vision in the sigmoid colon.  Preparation was  satisfactory although she had lot of liquid stool which was suctioned out  the scope.  The ileocolonic anastomosis was located in the area of the  splenic flexure.  Scope was passed into the neoterminal ileum with multiple  ulcers and erythema through intervening mucosa but there was no stricture.  The small bowel was examined for least 15 cm.  Pictures taken followed by  biopsy for routine histology.  Colonic mucosa was  examined for the second  time and it was normal.  Rectal mucosa similarly was normal.  Scope was  retroflexed to examine anorectal junction and single rectangular ulcer was  noted just above the dentate line which was not biopsied.  Endoscope was  then withdrawn.  The patient tolerated the procedure well.   FINAL DIAGNOSES:  1. Active small bowel Crohn disease with multiple ulcers, but no evidence      of stricture.  2. Colonic disease is limited to rectal disease with single ulcer.   It is interesting to note that she is on prednisone 30 mg a day and not in  remission.   RECOMMENDATIONS:  I will start on 6MP at 50 mg daily.  She will have CBC in  2 weeks.  If CBC is normal, will increase 6MP dose to 75 mg daily and start  tapering her prednisone in about 4 weeks.   Will also arrange for a PPD as I believe she would also benefit from  Remicade infusion.      Hildred Laser, M.D.  Electronically  Signed     NR/MEDQ  D:  01/21/2006  T:  01/23/2006  Job:  [id number]   cc:   Dr. Hoy Finlay, [place]

## 2010-09-04 NOTE — H&P (Signed)
[name], [name]                           ACCOUNT NO.:  [id number]   MEDICAL RECORD NO.:  [id number]                   PATIENT TYPE:  INP   LOCATION:  A303                                 FACILITY:  [facility]   PHYSICIAN:  Vernon Prey. Tamala Julian, M.D.                DATE OF BIRTH:  [birth year]   DATE OF ADMISSION:  08/21/2002  DATE OF DISCHARGE:                                HISTORY & PHYSICAL   HISTORY OF PRESENT ILLNESS:  [name] with a history of Crohn's  disease presents with a one week history of epigastric pain radiating  through to the left upper quadrant and later extending to the flank and  back.  She had persistent nausea but no vomiting.  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  Her  appetite has been poor.  She states that the pain is different from the pain  that she has had in her right lower quadrant and suprapubic area in the  past.  Patient was seen in the emergency room and was felt to have a tender  abdomen without evidence of ileus.  Amylase and lipase were elevated with  the lipase being 368 and amylase 494.  The patient is felt to have primary  pancreatitis and she is admitted for treatment and observation.   PAST MEDICAL HISTORY:  1. She has Crohn's disease dating back to 1993 and has had multiple     operations for this.  2. Other medical problems include rectovaginal fistula.  3. Diffuse multifocal joint inflammations probably related to arthritis and     her Crohn's disease.  4. She has an anemia of chronic disease.  5. Chronic depression with anxiety.   PAST SURGICAL HISTORY:  1. Cholecystectomy.  2. Right colectomy with terminal ileal resection.  3. Sigmoid colectomy.  4. Distal transverse colectomy.  5. Partial colectomy with small bowel resection.  6. She has also had a tubal ligation.  7. Total abdominal  hysterectomy.  8. Anovaginal fistula repair in 1995 and 2001.   PRESENT MEDICATIONS:  1. Premarin 1.25 mg daily.  2. Prednisone 10 mg t.i.d.  3. Lortab 10/500 q.i.d.  4. Xanax 0.5 mg p.r.n.   SOCIAL HISTORY:  Reveals that she is disabled.  Married.  She smokes 1-2  packs of cigarettes per day.  She does not drink or use drugs.   FAMILY HISTORY:  Positive for atherosclerotic heart disease.   PHYSICAL EXAMINATION:  GENERAL:  She is a very pale, thin, young female with  slight moon facies.  VITAL SIGNS:  Blood pressure 110/70, pulse 80, respirations 20, temperature  97 degrees.  HEENT:  Unremarkable.  NECK:  Supple.  No JVD, bruit, adenopathy or thyromegaly.  CHEST:  Clear to auscultation anteriorly and posteriorly.  No rales, rubs,  rhonchi or wheezes.  HEART:  Regular, rate and rhythm without murmur, gallop or rub.  ABDOMEN:  Mildly distended.  Diffusely tender.  More prominent tenderness in  the epigastrium and the left upper quadrant but moderate to mild tenderness  in the left lower quadrant and suprapubic area.  She has hyperactive bowel  sounds.  EXTREMITIES:  There is wasting of her arms and legs.  She has good  peripheral pulses.  There is mild tenderness of her knees and ankles but  there is no inflammation of these joints.  She has no cyanosis, clubbing or  edema.  NEUROLOGIC:  Cranial nerves are intact.  Deep tendon reflexes intact.  There  is no motor, sensory, or ___________ .  Gait and stance are normal.  Romberg  is negative.   IMPRESSION:  1. Initial onset of acute pancreatitis, etiology to be determined.  2. History of Crohn's disease status post multiple intestinal resections     with short-gut syndrome.  3. Chronic anemia.  4. Depression with anxiety.  5. Chronic anovaginal fistula.  6. Osteoarthritis.   PLAN:  The patient will be admitted.  She will be treated with IV fluids.  Her diet, will be maintained on liquids.  She will receive analgesics.  Will   follow her enzymes closely.  If symptoms should worsen will repeat CT scan.                                                 Vernon Prey. Tamala Julian, M.D.    LCS/MEDQ  D:  08/21/2002  T:  08/21/2002  Job:  [id number]

## 2010-09-04 NOTE — Op Note (Signed)
   [name], [name]                           ACCOUNT NO.:  [id number]   MEDICAL RECORD NO.:  [id number]                   PATIENT TYPE:  INP   LOCATION:  A327                                 FACILITY:  [facility]   PHYSICIAN:  Vernon Prey. Tamala Julian, M.D.                DATE OF BIRTH:  06-11-1960   DATE OF PROCEDURE:  11/28/2001  DATE OF DISCHARGE:  11/30/2001                                 OPERATIVE REPORT   PREOPERATIVE DIAGNOSIS:  Chronic cholecystitis.   POSTOPERATIVE DIAGNOSIS:  Acute and chronic cholecystitis.   PROCEDURE:  Laparoscopic cholecystectomy.   SURGEON:  Vernon Prey. Tamala Julian, M.D.   DESCRIPTION OF PROCEDURE:  Under general anesthesia the patient's abdomen  was prepped and draped in a sterile field.  A limited midline supraumbilical  incision was made and extended through the fascia.  Using blunt dissection  the bowel and omentum were separated from the anterior abdominal wall under  direct vision.  The blunt port as placed and the abdomen was insufflated.  A  laparoscope was placed.  The gallbladder was visualized.   Standard incisions were made in the right upper quadrant and a 10-mm port  and two 5-mm ports were placed under videoscopic guidance.  The gallbladder  was grasped with the grasping forceps and the cystic duce was dissected.  It  was clipped with 5 clips and divided.  There were 2 cystic artery branches.  They were clipped with 3 clips and divided.  The gallbladder was then  separated from the infrahepatic space without difficulty.  It was retrieved  intact.  There was no bleeding or bile leak from the bed.   Irrigation above the local __________ was carried out with irrigating  solution.  CO2 was allowed to escape from the abdomen and the ports were  removed.  The patient tolerated the procedure well.  The larger incisions  were closed with #0 Dexon.  Skin was closed with the staples.  Patient  tolerated the procedure well.  Dressings were placed.  She was  awakened from  anesthesia  transferred to a bed and taken to the postanesthetic care unit.                                               Vernon Prey. Tamala Julian, M.D.    LCS/MEDQ  D:  01/03/2002  T:  01/03/2002  Job:  [id number]

## 2010-09-04 NOTE — Discharge Summary (Signed)
[name], [name]                           ACCOUNT NO.:  [id number]   MEDICAL RECORD NO.:  [id number]                   PATIENT TYPE:  INP   LOCATION:  A327                                 FACILITY:  [facility]   PHYSICIAN:  Vernon Prey. Tamala Julian, M.D.                DATE OF BIRTH:  October 03, 1960   DATE OF ADMISSION:  11/23/2001  DATE OF DISCHARGE:  11/30/2001                                 DISCHARGE SUMMARY   DISCHARGE DIAGNOSES:  1. Crohn's disease of residual small bowel with acute exacerbation, treated     and improved.  2. Chronic cholecystitis with adenomyosis.  3. Chronic anemia.  4. Diffuse arthritis.  5. Anovaginal fistula.  6. Depression.   SPECIAL PROCEDURES:  1. Central venous catheter insertion on November 27, 2001.  2. Laparoscopic cholecystectomy on November 28, 2001.   DISPOSITION:  The patient is discharged home in stable and satisfactory  condition.  She is scheduled to be followed up in the office on December 07, 2001.   DISCHARGE MEDICATIONS:  1. Imodium 4 mg q.6h.  2. Xanax 1 mg q.8h.  3. OxyContin 20 mg q.12h.  4. Prednisone 20 mg t.i.d. for two weeks, 20 mg b.i.d. for two weeks, 10 mg     b.i.d.  5. Flagyl 500 mg t.i.d.   SUMMARY:  The patient is a 50 year old female who presented to the office  with a history of a progressive weakness and 10-pound weight loss.  She has  pain with all meals.  She had diarrhea up to six times per day.  She had  upper and lower abdominal pain.  She has a history of Crohn's disease with  multiple exacerbations in the past.  The patient appeared in acute distress  and initially refused to be admitted, but later returned because of more  severe pain.  History reveals that she has the diagnosis of Crohn's disease  dating back to November 1993 at which time she underwent right colectomy  with terminal ileum resection and sigmoid colectomy.  She did well until  1996 when she had recurrent disease and had resection of the distal  transverse colon and had an intracolonic anastomosis.  She had another  partial colectomy and small bowel resection for worsening of the disease in  2000.  She has had known adenomyomatosis of the gallbladder and had symptoms  suggestive of biliary colic.  The patient had refused to have HIDA scan  done.  Other history is given in the admission note.   PHYSICAL EXAMINATION:  GENERAL:  She was a chronically-ill-appearing female  in moderate distress.  VITAL SIGNS:  Blood pressure 100/63, pulse 56, respirations 16, weight 98  pounds.  ABDOMEN:  Revealed moderate diffuse epigastric and mid abdominal tenderness  with moderate tenderness in the hypogastrium and the right lower quadrant  area.   HOSPITAL COURSE:  She was admitted, started on IV fluids,  IV analgesics.  She was received IV Solu-Medrol and antibiotics.  CT of the abdomen was  ordered.  CT of the abdomen showed a 15 cm segment of the small bowel  leading up to the anastomosis with surrounding inflammatory tissues,  stranding consistent with Crohn's disease.  There was no evidence of abscess  formation and no evidence of fistula formation.  HIDA scan was finally done  after much coaxing.  This showed an ejection fraction of 22%.  The patient  remained symptomatic and she was counseled for the need for cholecystectomy.  Cholecystectomy was finally done on August 12.  This showed chronic  cholecystitis with thickening of the gallbladder.  The patient felt much  better immediately after surgery.  She was able to eat without any  difficulty.  All of her nausea and vomiting resolved.  She remained stable  and was discharged home on August 14 in much improved condition.                                               Vernon Prey. Tamala Julian, M.D.    LCS/MEDQ  D:  01/03/2002  T:  01/03/2002  Job:  [id number]

## 2010-09-04 NOTE — H&P (Signed)
[name], [name]                 ACCOUNT NO.:  [id number]   MEDICAL RECORD NO.:  [id number]          PATIENT TYPE:  INP   LOCATION:  A303                          FACILITY:  [facility]   PHYSICIAN:  Vernon Prey. Tamala Julian, M.D.   DATE OF BIRTH:  [birth year]   DATE OF ADMISSION:  05/14/2004  DATE OF DISCHARGE:  LH                                HISTORY & PHYSICAL   HISTORY OF PRESENT ILLNESS:  [name] admitted with recurrent  abdominal pain, nausea and vomiting, and diarrhea.  The patient has known  Crohn's disease.  She states that she was doing well until she developed the  acute onset of severe abdominal pain on May 12, 2004.  The pain became  increasingly severe.  She tried to work but seemed to be getting worse.  She  was seen in the emergency room, where she was noted to have stable vital  signs without fever, normal white count, normal hemoglobin and hematocrit,  and normal electrolytes with normal amylase and urine.  She was treated with  Phenergan and Dilaudid with some improvement.  The nausea was relieved and  her pain got better.  She had a CT of the abdomen done, which showed some  thickening of the residual terminal ileum but no abscess formation.  No  changes in the omentum.  She was discharged on Tylox and Phenergan.  After  arriving home, she states her pain became worse.  She was still afebrile but  she had mild leukocytosis with a white count of 16,000.  Since her pain was  fully controlled, it was felt that the patient needed to be admitted.  Though BUN and creatinine were normal, her hemoglobin had increased to 18.  It was felt that she probably was having a flare of her Crohn's disease but  we also need to rule out C. difficile colitis, intraabdominal fistula, or  other type of inflammatory process.   PAST HISTORY:  1.  The patient had a history of Crohn's disease dating back to 21.  2.  She has a history of multifocal joint inflammation related to her   Crohn's disease.  3.  She has a history of anemia in the past but this is presently stable.  4.  She also has chronic depression with a major anxiety component.   SURGERY:  1.  Cholecystectomy.  2.  Right colectomy with terminal ileum resection.  3.  Sigmoid colectomy.  4.  Distal transverse colectomy.  5.  Repeat partial colectomy with small bowel resection.  6.  Tubal ligation.  7.  Total abdominal hysterectomy.  8.  Repair of anal vaginal fistula in 1995 and 2001.   SOCIAL HISTORY:  She is married and disabled.  She smokes up to two packs of  cigarettes/day.  She does not drink alcohol and there is no history of drug  use.   MEDICATIONS:  1.  Prednisone 10 mg b.i.d.  2.  Imodium 2-4 mg q.2-3 hours p.r.n.  3.  Valium 10 mg t.i.d.  4.  OxyContin 20 mg b.i.d.  5.  Phenergan  25 mg q.4h. p.r.n.  6.  Prevacid 30 mg daily.   PHYSICAL EXAMINATION:  GENERAL:  She is a small framed female in no acute  distress.  She is lying quietly in bed.  VITAL SIGNS:  Blood pressure 130/70, pulse 70, respirations 18.  Weight 120  pounds.  Temperature 97.6.  HEENT:  Unremarkable.  She has redness of her forehead, her cheeks, and her  chin.  She has some puffiness to her face related to chronic steroid  therapy.  Oropharynx unremarkable.  NECK:  Supple.  No JVD, bruit, adenopathy, or thyromegaly.  CHEST:  Clear to auscultation.  No rales, rubs, rhonchi, or wheezes.  HEART:  Regular rate and rhythm without murmurs, rubs, or gallops.  ABDOMEN:  Soft.  Mildly distended.  Moderate tenderness to palpation in both  lower quadrants, more prominent in the right lower quadrant and the left  upper quadrant.  EXTREMITIES:  No cyanosis, clubbing, or edema.  No joint warmth.  Good range  of motion of all joints.  NEUROLOGIC:  No focal, motor, sensory, or cerebellar deficits.   IMPRESSION:  1.  Crohn's disease with acute exacerbation.  2.  History of pancreatitis.  3.  Gastroesophageal reflux disease.   4.  Depression with anxiety.  5.  Osteoarthritis.  6.  Prior history of anal vaginal fistula.   PLAN:  The patient is admitted and is started on IV Solu-Medrol.  She has  also been started on Unasyn.  Cultures will be checked.  A stool for C.  difficile will be checked.  She will receive Bentyl and analgesics and  Phenergan as tolerated.      LCS/MEDQ  D:  05/17/2004  T:  05/17/2004  Job:  [id number]

## 2010-09-04 NOTE — Discharge Summary (Signed)
[name], [name]                           ACCOUNT NO.:  [id number]   MEDICAL RECORD NO.:  [id number]                   PATIENT TYPE:  INP   LOCATION:  A306                                 FACILITY:  [facility]   PHYSICIAN:  Vernon Prey. Tamala Julian, M.D.                DATE OF BIRTH:  1960/10/28   DATE OF ADMISSION:  10/02/2002  DATE OF DISCHARGE:  10/09/2002                                 DISCHARGE SUMMARY   DISCHARGE DIAGNOSES:  1. Acute pancreatitis.  2. Mild hyperlipidemia.  3. Crohn's disease.  4. Urinary tract infection.  5. Chronic anemia.  6. Osteoarthritis.  7. Depression with anxiety.  8. Chronic anovaginal fistula.   DISPOSITION:  The patient is discharged home in stable and improved  condition.   DISCHARGE MEDICATIONS:  1. Xanax 0.5 mg b.i.d.  2. Protonix 40 mg daily.  3. Prednisone 10 mg t.i.d.  4. Premarin 0.125 mg daily.  5. Multivitamin 1 daily.  6. Lopid 600 mg b.i.d.  7. Pancrease 3 capsules with meals and 2 with snacks.  8. OxyContin 10 mg b.i.d.   DISCHARGE INSTRUCTIONS:  The patient is scheduled to be seen in the office  in 2 weeks and she will see Dr. Gala Romney in follow up also.   PAST HISTORY:  [name] admitted for treatment of severe  abdominal pain.  She had evidence of pancreatitis.  She had been previously  admitted to the hospital from May 4 through May 10 with evidence of acute  pancreatitis.  Past history is positive for Crohn's disease, multifocal  joint inflammation felt to be related to acute arthritis associated with  Crohn's disease.  She has a history of anemia and depression.   She has had multiple operations in the past including cholecystectomy, right  colectomy with terminal ileal resection, sigmoid colectomy, distal  transverse colectomy, partial colectomy with small-bowel resection,  bilateral tubal ligation, total abdominal hysterectomy and repair of  anovaginal fistula in 1995 and in 2001.   PHYSICAL EXAMINATION:   GENERAL:  On examination she is a young female, moon  facies due to steroid therapy.  VITAL SIGNS:  Blood pressure 120/70, pulse 60, respirations 20, temperature  98 degrees.  ABDOMEN:  Distended with moderate epigastric, left upper quadrant, and right  lower quadrant tenderness with significant guarding in the epigastrium and  the left upper quadrant.   HOSPITAL COURSE:  Initial lab value revealed amylase of 311, lipase 309.  Prior to discharge amylase was 94, lipase was 100.  Triglycerides were 312  on initial evaluation.  They were reduced to 167 prior to discharge. She had  a subclinical UTI due to Klebsiella pneumoniae.  This was treated.  She was  seen in consultation by Dr. Gala Romney who felt that she would benefit from an  MRCP.  After her pancreatitis was stable she was transferred to [facility] for aeration.  MRCP did not show any significant evidence of distal  common bile duct obstruction.  The patient's pain was well-controlled on  analgesics by the 22nd.  She was tolerating a diet at that time. She  remained afebrile. She was, therefore, discharged home and she will have  follow up in the office and with Dr. Gala Romney.                                               Vernon Prey. Tamala Julian, M.D.    LCS/MEDQ  D:  10/23/2002  T:  10/23/2002  Job:  [id number]

## 2010-09-04 NOTE — Discharge Summary (Signed)
NAMEROJEAN, IGE                           ACCOUNT NO.:  [id number]   MEDICAL RECORD NO.:  [id number]                   PATIENT TYPE:  INP   LOCATION:  A303                                 FACILITY:  [facility]   PHYSICIAN:  Vernon Prey. Tamala Julian, M.D.                DATE OF BIRTH:  Feb 07, 1961   DATE OF ADMISSION:  08/21/2002  DATE OF DISCHARGE:  08/27/2002                                 DISCHARGE SUMMARY   DISCHARGE DIAGNOSES:  1. Acute pancreatitis.  2. Crohn's disease.  3. Chronic anemia.  4. Depression with anxiety.  5. Osteoarthritis.  6. Chronic anal vaginal fistula.   DISPOSITION:  The patient is discharged home in stable and satisfactory  condition.   DISCHARGE MEDICATIONS:  1. Niacin 1 gram daily.  2. Aspirin 30 mg before niacin.  3. Multivitamin one daily.  4. Premarin 1.25 mg daily.  5. Prednisone 10 mg t.i.d.  6. Protonix 40 mg daily.  7. Xanax 0.5 mg b.i.d.  8. __________ 30 mg daily.   The patient is scheduled to be seen in the office in one and a half to two  weeks post discharge.   SUMMARY:  [name] with a history of Crohn's disease who  presents with a one-week history of epigastric pain, radiating through to  the left upper quadrant and extending to the flank and back.  She has nausea  but no vomiting.  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  She states that her  diarrhea was more profuse than usual.  Appetite was poor.  The patient was  seen in the emergency room and was noted to have an amylase and lipase that  were significantly elevated.  It was felt that she had primary pancreatitis  and she was admitted.  Other history is given in the admission note.  The  patient was admitted, treated with IV fluids.  She was maintained on liquids  and was given IV analgesics.  Her white count remained normal.  Her amylase  and lipase gradually improved.  After her high parameter  __________ improved  her general state gradually improved.  Diet was modified until she could  tolerate it without having nausea or vomiting.  By the 9th she was feeling much better.  She denied nausea or vomiting or  diarrhea.  She was afebrile.  Abdominal exam was benign except for mild  right lower quadrant tenderness which is a chronic finding.  She was  discharged home in satisfactory condition.                                                Vernon Prey. Tamala Julian, M.D.    LCS/MEDQ  D:  11/03/2002  T:  11/04/2002  Job:  [id number]

## 2010-09-04 NOTE — Op Note (Signed)
[name], [name]                 ACCOUNT NO.:  [id number]   MEDICAL RECORD NO.:  [id number]          PATIENT TYPE:  AMB   LOCATION:  DAY                           FACILITY:  [facility]   PHYSICIAN:  Jamesetta So, M.D.  DATE OF BIRTH:  [birth year]   DATE OF PROCEDURE:  12/08/2005  DATE OF DISCHARGE:                                 OPERATIVE REPORT   PREOPERATIVE DIAGNOSIS:  Incisional hernia.   POSTOPERATIVE DIAGNOSIS:  Incisional hernia.   PROCEDURE:  Incisional herniorrhaphy with mesh.   SURGEON:  Dr. Aviva Signs.   ANESTHESIA:  General endotracheal.   INDICATIONS:  The patient is a 51 year old white female who suffers from  Crohn disease and is on chronic steroid therapy who presents with an  incisional hernia along the lower aspect of her abdomen.  She has had  multiple abdominal surgeries in the past.  Risks and benefits of the  procedure including bleeding, infection, recurrence of the hernia were fully  explained to the patient, who gave informed consent.   PROCEDURE NOTE:  The patient was placed in the supine position.  After  induction of general endotracheal anesthesia, the abdomen was prepped and  draped in the usual sterile technique with Betadine.  Surgical site  confirmation was performed.   The previous infraumbilical lower midline surgical scar was excised.  The  peritoneal cavity was entered into without difficulty.  She did have a  weakened abdominal wall with an incisional hernia along the lower 2/3 of the  incision.  Old sutures were removed.  The bowel was not adhesed to the  underside of the abdominal wall.  MTF graft was then secured to the fascial  edges using 0 Prolene interrupted sutures.  The subcutaneous layer was  reapproximated using a 2-0 Vicryl interrupted suture.  Skin was closed using  staples.  0.5% Sensorcaine was instilled in the surrounding wound.  Betadine  ointment dry sterile dressing were applied.   All tape and needle counts correct  at the end of the procedure.  The patient  was extubated in the operating room went back to recovery room awake in  stable condition.   COMPLICATIONS:  None.   SPECIMEN:  None.   BLOOD LOSS:  Minimal.      Jamesetta So, M.D.  Electronically Signed     MAJ/MEDQ  D:  12/08/2005  T:  12/08/2005  Job:  [id number]

## 2010-09-04 NOTE — H&P (Signed)
[facility]  Patient:    [name], [name] Visit Number: [id number] MRN: [id number]          Service Type: MED Location: 3A W388 [id number] Attending Physician:  Saunders Revel Dictated by:   Iona Beard, M.D. 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  The patient is admitted by Dr. Mohammed Kindle for evaluation of stomach pain, nausea, vomiting, fever, diaphoretic spell, and general malaise x 3 days.  Stomach pain is described as severe, diffuse pressure.  The pain is constant.  She vomited x 1 on April 06, 2001 (vomitus ______ ).  History is also positive for a temperature of 102 degrees Fahrenheit on April 06, 2001, and diaphoretic spell.  She denies diarrhea, melena, abdominal distention, constipation, rash, and joint pain.  Medical history is positive for Crohns disease.  Medical history is negative for diabetes, hypertension, tuberculosis, cancer, cystic fibrosis, asthma, and seizure disorder.  MEDICATIONS: 1. Azathioprine 50 mg p.o. every day. 2. Prevacid 30 mg p.o. every day. 3. Estrogen 1 tablet p.o. every day. 4. Lortab 10 mg p.o. every day. 5. Over-the-counter multivitamin.  ALLERGIES:  The patient is not allergic to any known medication.  HABITS:  Positive for cigarette smoking (one pack per day) since age 23. Former use of ethanol since age 58.  The patient denies use of street drugs.  SEXUALLY TRANSMITTED DISEASE HISTORY:  Negative for gonorrhea, syphilis, herpes, and HIV infection.  PAST MEDICAL HISTORY: 1. Crohns disease with perforation into the sigmoid colon and pelvic abscess,    treated on March 12, 1992, with diagnostic laparoscopy, sigmoid    colectomy, and right colectomy with terminal ileal resection by Dr. Mohammed Kindle at [facility]. 2.  Recurrent Crohns disease with near-complete obstruction of enterocolonic    anastomosis with perforation of terminal ileum into retroperitoneum on the    right, treated with exploratory laparotomy with extensive adhesiolysis,    resection of distal transverse colon with enterocolonic anastomosis on    July 20, 1994, by Dr. Mohammed Kindle at [facility]. 3. Partial bowel obstruction, by Dr. Everette Rank, in April 1994. 4. Recurrent rectovaginal fistula, by Dr. Mallory Shirk, at [facility] in August 2001, with repair. 5. Bilateral tubal ligation. 6. Tonsillectomy at age 71 at [facility]. 7. Hysterectomy, by Dr. Mallory Shirk.  REVIEW OF SYSTEMS:  Positive for episodic right shoulder pain, episodic left knee pain, episodic left ankle pain, occasional blood in stool, recurrent episodic abdominal pain, and episodic bloating.  Review of systems is negative for significant weight loss, bleeding gums, epistaxis, shortness of breath, wheezing, hemoptysis, chest pain, syncope, dizziness, dysuria, gross hematuria, and edema of legs.  FAMILY HISTORY:  Mother living, age 44, in good health.  Father deceased, age 110, secondary to myocardial infarction.  One brother living, age 47, in good health.  One brother deceased, age 59, secondary to motor vehicle accident. One sister living, age 51, in good health.  One son living, age 33, in good health.  One daughter living, age 89, in good health.  PHYSICAL EXAMINATION:  VITAL SIGNS:  In the emergency room temperature was 97.6, pulse 84, respirations 18, blood  pressure 90/55.  GENERAL:  Middle-aged, small-framed, medium height, alert white female who appeared not to feel well but in no apparent respiratory distress.  SKIN:  Warm and dry.  HEENT:  Head:  Normocephalic.  Ears:  Normal auricles.  External canals patent.  Tympanic membranes pearly gray.  Eyes:  Lids negative for ptosis. Sclerae white.  Pupils round, equal, and reactive  to light.  Extraocular movements intact.  Nose:  Negative for discharge.  Mouth:  Positive for missing teeth.  Remaining dentition good.  No bleeding gums.  Posterior pharynx benign.  NECK:  Negative for lymphadenopathy or thyromegaly.  LUNGS:  Clear.  HEART:  Audible S1, S2 without murmur.  Regular rate and rhythm.  BREASTS:  No skin changes.  No nodules on palpation.  Nipples erect, without discharge.  ABDOMEN:  No distention.  Positive bowel sounds.  Positive for old healed mid hypogastric surgical scar.  Soft, positive, with diffuse tenderness (greatest tenderness in midepigastrium and right lower quadrant).  No palpable mass or organomegaly.  PELVIC:  Deferred.  RECTAL:  No external lesions.  Digital exam positive for stool in the rectal vault.  Stool slightly positive.  Digital exam revealed no rectal vault masses.  EXTREMITIES:  No tibial edema.  No joint swelling, no joint redness, no joint hotness.  NEUROLOGIC:  Alert and oriented to person, place, and time.  Cranial nerves II-XII appeared intact.  LABORATORY DATA:  Serum lipase 190, serum amylase 168.  White count 5.1, hemoglobin 12.7, hematocrit 35.4, platelets 275,000.  Sodium 138, potassium 3.4, chloride 105, CO2 31, glucose 90, BUN 6, creatinine 0.6, calcium 8.5, total protein 6.7, albumin 2.8, SGOT 13, SGPT 9, alkaline phosphatase 108, total bilirubin 0.5.  IMPRESSION:  Recurrent abdominal pain secondary to exacerbation of Crohns disease.  PLAN:  As ordered, IV fluids, clear liquid diet, CT of abdomen, IV Pepcid, IV antibiotics.  Repeat CBC, erythrocyte sedimentation rate, MET-7 in 24 hours, potassium IV.  Monitor Is and Os.Dictated by:   Iona Beard, M.D.  Attending Physician:  Saunders Revel DD:  04/07/01 TD:  04/08/01 Job: [id number]

## 2010-09-04 NOTE — H&P (Signed)
[name], MANDER NO.:  [id number]   MEDICAL RECORD NO.:  [id number]           PATIENT TYPE:   LOCATION:                                 FACILITY:   PHYSICIAN:  Jamesetta So, M.D.  DATE OF BIRTH:  May 05, 1960   DATE OF ADMISSION:  DATE OF DISCHARGE:  LH                                HISTORY & PHYSICAL   CHIEF COMPLAINT:  Incisional hernia.   HISTORY OF PRESENT ILLNESS:  Patient is a 50 year old white female who is  referred for evaluation and treatment of incisional hernia.  It has been  present for approximately two years but is causing increasing pain and  discomfort.  No nausea or vomiting have been noted.  She has had multiple  abdominal surgeries in the past for Crohn's disease.   PAST MEDICAL HISTORY:  As noted above.   PAST SURGICAL HISTORY:  Bowel surgery for Crohn's disease, cholecystectomy,  herniorrhaphy.   CURRENT MEDICATIONS:  1. Prednisone 10 mg p.o. t.i.d.  2. Prevacid 20 mg p.o. q. day.  3. Levsin 1 tablet p.o. q. day.  4. Loperamide 2 mg p.o. q.4 h. p.r.n.  5. Oxycodone 10 mg p.o. q.8 h. p.r.n. pain.   ALLERGIES:  NO KNOWN DRUG ALLERGIES.   REVIEW OF SYSTEMS:  Patient smokes a half a pack of cigarettes a day.  She  denies any significant alcohol use.   PHYSICAL EXAMINATION:  GENERAL:  Patient is a well-developed, well-nourished  white female in no acute distress.  LUNGS:  Clear to auscultation with equal breath sounds bilaterally.  HEART:  Regular rate and rhythm without S3, S4 or murmurs.  ABDOMEN:  Soft and nondistended.  She is tender along the inferior aspect of  a midline incision below the umbilicus, and reducible hernia is noted.  No  hepatosplenomegaly or masses are noted.   IMPRESSION:  Incisional hernia.   PLAN:  The patient is scheduled for an incisional herniorrhaphy with mesh on  December 08, 2005.  The risks and benefits of the procedure including  bleeding, infection and recurrence of a hernia were fully  explained to the  patient, I gave informed consent.      Jamesetta So, M.D.  Electronically Signed     MAJ/MEDQ  D:  12/02/2005  T:  12/02/2005  Job:  [id number]   cc:   Jamesetta So, M.D.  Fax: [phone]   Roslynn Amble, M.D.

## 2010-09-04 NOTE — Assessment & Plan Note (Signed)
[facility]                           PRIMARY CARE OFFICE NOTE   JEANEEN, CALA                        MRN:          [id number]  DATE:05/04/2006                            DOB:          Sep 07, 1960    CHIEF COMPLAINT:  New patient to practice.   HISTORY OF PRESENT ILLNESS:  The patient is a 50 year old white female  here to establish primary care.  She was formerly followed by Dr. Tamala Julian  in [place], [place].  She has a complex medical history  including chronic Crohn's disease with rectovaginal fistulas.  She  states that she was initially diagnosed with Crohn's in 1993.  She has  had several abdominal surgeries, and has been on various regimens.  Currently on Pentasa and mercaptopurine.  She has not been on Remicade  in the past.   Due to her multiple abdominal surgeries, she has had chronic pain issues  in her abdomen and takes oxycodone 20 mg twice a day.  She has not seen  a pain management specialist in the past.  She describes also chronic  arthralgias in her shoulders, elbows and knees and also her left hip.  She requests referral to a rheumatologist.   She also has had an episode of pancreatitis, unclear etiology.  She is  not a drinker.  There is no report of hypertriglyceridemia.   CURRENT MEDICATIONS:  1. Prednisone 10 mg once a day.  2. Oxycodone 20 mg b.i.d.  3. Pentasa 500 mg two q.i.d.  4. Prevacid 30 mg once a day.  5. Multivitamin one a day.  6. Hyoscyamine 0.375 mg one a day.  7. Mercaptopurine 50 mg once a day.   ALLERGIES TO MEDICATIONS:  None known.   SOCIAL HISTORY:  She is separated.  She became disabled from [employer] work  when she developed Crohn's disease.  She has 2 children grown, ages 70  and 65.  She currently is living a roommate.   FAMILY HISTORY:  Mother is alive at age 24 with a history of alcohol  use, arthritis and mental illness.  Father deceased at age 45 secondary  to a myocardial  infarction.  No family history of inflammatory bowel  disease.  Grandmother is noted to have breast cancer.   HABITS:  She does not drink.  She has been smoking for the last 20  years.  Currently, she smokes approximately one-half pack per day.   REVIEW OF SYSTEMS:  No recent fevers or chills.  Occasional sores in the  mouth.  Denies any chest pain.  Some mild dyspnea on exertion.  Has  chronic abdominal discomfort as noted above.  No dysuria, frequency or  urgency.  She has had DEXA scanning in the past with history of chronic  steroid use.  Results unavailable, but she was told by her previous  physician that she does have osteoporosis.   PHYSICAL EXAMINATION:  VITAL SIGNS:  Height if 5 feet, 3 inches.  Weight  is 113 pounds.  Temperature is 98, pulse is 90, blood pressure is 102/71  in the left arm in  the seated position.  GENERAL:  The patient is a pleasant, thin 50 year old white female in no  apparent distress who appears somewhat older than her stated age.  HEENT:  Normocephalic and atraumatic.  Pupils equal and reactive to  light bilaterally.  Extraocular muscles were intact.  The patient was  anicteric.  Conjunctivae within normal limits.  External auditory canals  and tympanic membranes were clear bilaterally.  Hearing was grossly  normal.  Oropharyngeal exam was unremarkable.  No evidence of stomatitis  or ulcers.  NECK:  Supple.  No adenopathy, carotid bruit or thyromegaly.  CHEST:  Normal respiratory effort.  Chest was clear to auscultation  bilaterally.  No rhonchi, rales or wheezing.  CARDIOVASCULAR:  Regular rate and rhythm.  No significant murmurs, rubs  or gallops appreciated.  ABDOMEN:  Soft.  The patient has a lower abdomen scar.  The patient had  vague diffuse tenderness.  No organomegaly appreciated.  MUSCULOSKELETAL:  No clubbing, cyanosis, or edema.  SKIN:  Warm and dry.  NEUROLOGIC:  Cranial nerves II-XII grossly intact.  She was non-focal.    IMPRESSION/RECOMMENDATIONS:  1. Inflammatory bowel disease/Crohn's disease with history of      rectovaginal fistula status post laparotomy x2.  2. History of pancreatitis of unclear etiology.  3. Tobacco abuse.  4. History of cholecystectomy.  5. History of hysterectomy.  6. 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  7. 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  8. Health maintenance.   RECOMMENDATIONS:  We discussed the policy of new patients who are  currently on narcotics.  I will try to refer the patient to pain  management so we can co-manage her pain medication needs.  If the  patient is unable to establish with a pain management specialist, she  may need to establish with a new primary care physician.  She was given  a refill for her oxycodone in the ER today, 20 mg b.i.d.  This was  previously filled by her gastroenterologist.   She is to discuss with her gastroenterologist whether it is wise at this  time to discontinue smoking, as this may cause a flare in her  inflammatory bowel disease symptoms.   We will try to obtain records from her previous physician regarding DEXA  scan; however, this may need to be repeated.  She will likely need  bisphosphonate therapy.   FOLLOWUP:  Followup is in approximately 6-8 weeks time.     Sandy Salaam. Shawna Orleans, DO  Electronically Signed    RDY/MedQ  DD: 05/04/2006  DT: 05/04/2006  Job #: [id number]

## 2010-09-04 NOTE — Op Note (Signed)
[facility]  Patient:    [name], [name] Visit Number: [id number] MRN: [id number]          Service Type: MED Location: 3A N887 [id number] Attending Physician:  Delorise Jackson Dictated by:   Irving Shows, M.D. Admit Date:  04/07/2001 Discharge Date: 04/14/2001                             Operative Report  PREOPERATIVE DIAGNOSIS:  Crohns disease.  POSTOPERATIVE DIAGNOSES: 1. Mild gastritis. 2. Recurrent Crohns ileitis.  PROCEDURE:  Esophagogastroduodenoscopy and total colonoscopy with biopsies.  SURGEON:  Irving Shows, M.D.  DESCRIPTION OF PROCEDURE:  The patient was taken to the endoscopy suite.  She was monitored as per protocol with IV conscious sedation.  She was given Versed 5 mg IV, Demerol 100 mg IV.  Oropharynx was anesthetized with cetacaine spray.  She was turned in the left lateral decubitus position.  Endoscope was introduced into the retropharynx uneventfully.  The esophagus was normal throughout its length.  The esophagogastric junction was unremarkable.  The stomach revealed normal fundus and body with mild inflammation in the prepyloric antrum.  Pylorus was unremarkable.  The duodenal bulb and the first and second portions of the duodenum were normal.  Retroflex view revealed no abnormality of the fundus.  There was no significant hiatal hernia.  The scope was slowly withdrawn.  The patient tolerated the procedure well.  She was then turned and repositioned to undergo total colonoscopy.  The total colonoscopy report is already signed and in the chart. Dictated by:   Irving Shows, M.D. Attending Physician:  Delorise Jackson DD:  05/20/01 TD:  05/21/01 Job: [id number]
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
## 2010-09-04 NOTE — H&P (Signed)
[name]                           ACCOUNT NO.:  [id number]   MEDICAL RECORD NO.:  [id number]                   PATIENT TYPE:  INP   LOCATION:  A327                                 FACILITY:  [facility]   PHYSICIAN:  Vernon Prey. Tamala Julian, M.D.                DATE OF BIRTH:  12/22/1960   DATE OF ADMISSION:  11/23/2001  DATE OF DISCHARGE:                                HISTORY & PHYSICAL   HISTORY OF PRESENT ILLNESS:  [name] who presented to the  office with a history of progressive weakness, 10-pound weight loss.  She  has pain with all meals.  She has diarrhea up to six times per day.  She has  upper and mid abdominal pain.  She denies bleeding with the bowel movements.  She has known Crohn disease and has had multiple exacerbations in the past.  Her last exacerbation was in December of 2002.  The patient appeared to be  in acute distress and was advised to be admitted.  Initially, she refused  but later returned because the pain became more severe.  Significant history  reveals that she had a diagnosis of Crohn disease dating back to November of  1993 and underwent right colectomy with terminal ileum resection and sigmoid  colectomy at that time.  She did well until 1996 when she had recurrent  disease and had resection of the distal transverse colon and the  enterocolonic anastomosis.  Shortly thereafter during the same year she had  hysterectomy with repair of a bladder fistula by Dr. Glo Herring.  She did well  until 2000 when she underwent partial colectomy and small-bowel resection  for worsening disease.  She has developed an anovaginal fistula which has  been repaired but has recurred at this time.  The patient also has a known  adenomyomatosis of the gallbladder and has symptoms suggestive of biliary  colic.  We tried to get a HIDA scan done but she was uncooperative because  she had significant biliary colic during the procedure.   PAST MEDICAL HISTORY:   Unremarkable except as noted above.  She has had a  tubal ligation and tonsillectomy as other surgeries.  She smokes 1 to 1-1/2  pack per day.  She does not drink.  She has diffuse joint disease which is  felt to be related to her Crohn disease.   FAMILY HISTORY:  Positive for atherosclerotic heart disease.   MEDICATIONS:  1. Lortab 10/500 q.i.d.  2. Prednisone 10 mg two q.i.d.  3. Imodium 4 mg q.i.d.  4. Xanax 1 mg t.i.d.   PHYSICAL EXAMINATION:  GENERAL:  She is a very frail, chronically ill-  appearing female in moderate distress.  VITAL SIGNS:  Blood pressure 100/63, pulse 76, respirations 16, weight 98  pounds.  HEENT:  Unremarkable.  There are no changes of her fundi.  NECK:  Supple without JVD or bruit.  CHEST:  Clear to auscultation.  No rales, rubs, rhonchi, or wheezes.  HEART:  Regular rate and rhythm without murmur, gallop, or rub.  ABDOMEN:  Moderate diffuse epigastric and mid abdominal tenderness.  Hyperactive bowel sounds.  Moderate tenderness in the hypogastrium and  especially in her lower quadrant.  EXTREMITIES:  Good range of motion of her joints without any tenderness on  exam today.  No swelling or erythema.  She does not have any peripheral  edema.  NEUROLOGICAL:  No focal motor, sensory or cerebellar deficit.   IMPRESSION:  1. Crohn disease with acute exacerbation.  2. 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  3. Chronic anemia due to chronic disease.  4. 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  5. Anovaginal fistula.  6. Depression.   PLAN:  The patient was admitted, started on IV analgesics and IV fluids.  She will receive IV Solu-Medrol and antibiotics.  We will order a CT scan of  the abdomen with contrast.  She will also have a HIDA scan to evaluate  gallbladder function.  We will also get a bone scan during this  hospitalization.                                                 Vernon Prey. Tamala Julian, M.D.    LCS/MEDQ  D:  11/23/2001  T:  11/24/2001  Job:  [id number]

## 2010-09-04 NOTE — Consult Note (Signed)
NAME:  Michelle, Saunders                           ACCOUNT NO.:  [id number]   MEDICAL RECORD NO.:  [id number]                   PATIENT TYPE:  INP   LOCATION:  A306                                 FACILITY:  [facility]   PHYSICIAN:  R. Garfield Cornea, M.D.              DATE OF BIRTH:  04-Oct-1960   DATE OF CONSULTATION:  DATE OF DISCHARGE:                                   CONSULTATION   REASON FOR CONSULTATION:  Pancreatitis, abnormal ultrasound.   HISTORY OF PRESENT ILLNESS:  [name] is a pleasant 50 year old  female with well-documented small bowel Crohn's disease admitted to the  hospital with recurrent pancreatitis.  This lady was hospitalized with  epigastric/right upper quadrant abdominal pain back in May.  Amylase and  lipase were elevated.  CT scan demonstrated inflammatory changes in the area  of the pancreas with peripancreatic fluid collection.  She improved with  symptomatic treatment.  She says she really never got completely well.  She  has had some vague epigastric/right upper quadrant abdominal pain.  She  developed rather severe epigastric/right upper quadrant abdominal pain  shooting into my back three days prior to this admission.   Of note, her LFTs have been repeatedly normal this admission.  Ultrasound of  the abdomen this admission demonstrated an 8-mm bile duct that was somewhat  dilated.  There was some echogenicity around the area of the distal common  bile duct.  A small calculus could not be excluded.   Ms. [name] is status post cholecystectomy last year by Dr. Tamala Julian for  reported biliary dyskinesia.   Ms. [name] has remained afebrile.  Her pain has been controlled.  A lipid  profile was performed.  Triglycerides level was 312.  She does not consume  alcohol.  There is no family history of pancreatic cancer or pancreatitis.  White count was 11.8 on admission.   She has been started on Lopid 600 mg orally twice a day.   This lady has a long history of  small bowel Crohn's disease, has had  multiple surgeries.  She was intolerant to Pentasa  (reported exacerbation  of diarrhea).  She has been maintained on prednisone for a few years.  Per  her report, she has been on 30 milligrams daily recently.   PAST MEDICAL HISTORY:  1. Crohn's disease diagnosed in 1993.  2. Status post right colectomy.  3. Terminal ileum resection.  4. Sigmoid colectomy.  5. Distal transverse colectomy.  6. Small bowel resection.  7. __________ of intervaginal fistula.  8. Anxiety.  9. Neurosis.  10.      Depression.   MEDICATIONS:  On admission, prednisone, Lortab, Protonix, Xanax.   ALLERGIES:  No known drug allergies.   FAMILY HISTORY:  No history of chronic GI or liver illness.   SOCIAL HISTORY:  The patient is separated, has 2 children.  She smokes one  pack of  cigarettes per day and rarely consumes alcohol.   REVIEW OF SYSTEMS:  Some decreased appetite.  GI symptoms as above.  Weight  has not changed.   PHYSICAL EXAMINATION:  GENERAL:  A pleasant 50 year old lady resting  comfortably.  VITAL SIGNS:  Temperature 97.9, pulse 60, respiratory rate 16, blood  pressure 143/79, weight 113.5.  SKIN:  Warm and dry.  There is no jaundice.  No condition to signify chronic  liver disease.  HEENT:  No scleral icterus.  Conjunctivae pink.  Oral cavity:  No lesions.  CHEST:  Lungs are clear to auscultation.  CARDIAC:  Regular rate and rhythm, without murmur, gallop or rub.  ABDOMEN:  Nondistended, positive bowel sounds, soft.  She does have some  right upper quadrant tenderness to palpation.  No appreciable mass or  organomegaly.  EXTREMITIES:  No edema.   LABORATORY EVALUATION:  White count 11.8, H&H 14.0 and 40.1.  Sodium 137,  potassium 4.0, chloride 108, CO2 26, glucose 100, BUN 6, creatinine 0.7,  calcium 8.6, total protein 6.3, albumin 3.2, AST 17, ALT 12, ALP 98, total  bilirubin 0.3.  Amylase was 311 on admission and lipase 309.  On October 05, 2002,  amylase 79, lipase 64.  This morning, amylase back up to 157, lipase  somewhat elevated at 111 as well.  Lipid profile reveals a triglycerides  level of 312 on admission, down to 121 on October 04, 2002.  Urinalysis is  positive for nitrite, bacteria many.   She is on IV Levaquin.   IMPRESSION:  [name] is a 50 year old lady admitted to the hospital  with a second bout of pancreatitis in as many months.  Ultrasound now shows  a bile duct diameter of 8 mm.  CT last month did not demonstrate any biliary  dilation (this is an apples and oranges comparison).  There is a  questionable echogenic foci in the area of the common bile duct but not  compelling for common duct stone.  The findings of the ultrasound are not at  all compelling for biliary obstruction, stone, etc., particularly in view of  repeatedly low-normal liver functions.   At this time, the etiology of her pancreatitis is uncertain.  Her  triglycerides were a little elevated at 312 but would not invoke  hyperlipidemia as a cause of pancreatitis.  I would like to see lipid levels  in the thousands before making a connection there.   At this time, an occult common duct stone does, however, remain in the  differential as does sphincter of Oddi dysfunction.  Congenital abnormality  of the pancreas, although less likely, also remains in the differential.   Not mentioned above, she certainly does not consume any alcohol, and I doubt  alcohol is playing a role.   It is also unlikely that Crohn's disease is involving her proximal small  bowel (based on recent CT), but this would also be in the differential as  well.   Morphine may be adding to sphincter of Oddi pressures and maybe need to  consider another analgesic.    RECOMMENDATIONS:  1. Proceed with an MRI/ MRCP to further evaluate the pancreatic biliary tree    for occult stones, etc. in lieu of ERCP at this point in time.  2. Switch out morphine to another  analgesic, such as Nubain.  3. Pancreatic enzyme supplements with meals and with snacks.  4. If the MRCP is unrevealing for CBD calculus, then would give  consideration to biliary manometry in the setting of an ERCP at a     tertiary referral center.   For further recommendations to follow.   I would like to thank Leane Para C. Tamala Julian for allowing me to see this nice lady.  I will be discussing the case further with him in the near future.                                               Bridgette Habermann, M.D.    RMR/MEDQ  D:  10/06/2002  T:  10/06/2002  Job:  [id number]

## 2010-09-04 NOTE — Procedures (Signed)
NAMEAVELINE, DAUS NO.:  [id number]   MEDICAL RECORD NO.:  [id number]          PATIENT TYPE:  INP   LOCATION:  A303                          FACILITY:  [facility]   PHYSICIAN:  Scarlett Presto, M.D.   DATE OF BIRTH:  12/02/1960   DATE OF PROCEDURE:  05/19/2004  DATE OF DISCHARGE:                                  ECHOCARDIOGRAM   TAPE NUMBER:  [id number].   TAPE COUNT:  [id number].   HISTORY:  No previous echos.  This is a 50 year old woman with chest pain.   DESCRIPTION OF PROCEDURE:  The technical quality of the study is adequate.  M-mode tracing the aorta is 25 mm.   The left atrium is 25 mm.   The septum is 10 mm.   The posterior wall is 10 mm.   The left ventricular diastolic dimension is 41 mm.   The left ventricular systolic dimension is 27 mm.   2-D AND DOPPLER IMAGING:  The left ventricle is normal size with normal  systolic function.  Estimated ejection fraction is 65-70%.  There are no  obvious wall motion abnormalities.   The right ventricle is top normal in size with normal systolic function.   Both atria appear to be normal size.   The aortic valve is morphologically unremarkable with no stenosis or  regurgitation.   The mitral valve appears to be morphologically unremarkable with trace to  mild regurgitation.  No stenosis is seen.   The tricuspid valve has mild regurgitation.   The pulmonic valve was not well seen.   There is a prominent anterior fat pad versus a small anterior pericardial  effusion with no significant tamponade.   The ascending aorta was not well seen.   The inferior vena cava appears to be normal size.      JH/MEDQ  D:  05/19/2004  T:  05/19/2004  Job:  [id number]

## 2010-09-04 NOTE — Discharge Summary (Signed)
[name], [name]                 ACCOUNT NO.:  [id number]   MEDICAL RECORD NO.:  [id number]          Saunders TYPE:  INP   LOCATION:  A303                          FACILITY:  [facility]   PHYSICIAN:  Vernon Prey. Tamala Julian, M.D.   DATE OF BIRTH:  [birth year]   DATE OF ADMISSION:  05/14/2004  DATE OF DISCHARGE:  02/02/2006LH                                 DISCHARGE SUMMARY   DISCHARGE DIAGNOSES:  1.  Crohn's disease with acute exacerbation.  2.  History of pancreatitis.  3.  Gastroesophageal reflux disease.  4.  Depression with anxiety.  5.  Osteoarthritis.  6.  Sinus bradycardia with chest pain.  7.  History of chronic tobacco use.   DISPOSITION:  [name] is discharged home in stable improved condition.   DISCHARGE MEDICATIONS:  1.  Prevacid 30 mg daily.  2.  Phenergan 25 mg q.4-6h. p.r.n. nausea.  3.  OxyContin 20 mg b.i.d.  4.  Valium 10 mg t.i.d.  5.  Prednisone 10 mg t.i.d. x1 week then 10 mg b.i.d.  6.  Levaquin 750 mg daily.  7.  Diflucan 100 mg daily.  8.  Lomotil 2.5 mg two tabs q.i.d. p.r.n.   FOLLOWUP:  [name] will be seen in our office in two weeks post-  discharge, and will see Dr. Melvern Banker for cardiology evaluation on June 02, 2004.  She also will see Dr. Rosario Jacks on June 04, 2004.   SUMMARY:  [name] admitted with recurrent abdominal pain,  nausea, vomiting, and diarrhea.  [name] has known Crohn's disease.  She  was doing very well until she developed Michelle acute onset of severe abdominal  pain on May 12, 2004.  Michelle pain became more severe.  She was seen in Michelle  emergency room where she was noted to have a normal evaluation.  She was  treated with Phenergan and Dilaudid which gave her some improvement.  CT  scan of Michelle abdomen showed thickening of residual terminal ileum, but no  abscess formation.  [name] was discharged home on Tylox and Phenergan.  After arriving home, her pain became worse.  She returned to Michelle emergency  room  where it was felt that she needed to be admitted.  Her most likely  reason for Michelle severe discomfort was flare-up of her Crohn's disease.  Michelle  past history shows Crohn's' disease in 1993.  She also has multi-focal joint  inflammation related to her Crohn's' disease.  History of anemia and chronic  depression.  On examination, she was afebrile, abdomen revealed moderate  distention with tenderness to palpation both lower quadrants, more prominent  in Michelle right lower quadrant and Michelle left upper quadrant.  She was admitted,  started on IV Solu-Medrol, she was also started on Unasyn.  Routine stool  cultures were sent, and stool for C. difficile was checked.  [name]  gradually improved on this treatment.  Her abdominal symptoms resolved over  Michelle next four days.  C. difficile titer was negative.  Routine stool  cultures were negative.   During this hospitalization, Michelle  Saunders developed chest pain on May 18, 2004, and she was noted to have a pulse of 36.  She had chest tightness  during this time.  She gave a history of having some episodes in Michelle past.  Because of this, cardiology consult was obtained.  [name] was seen by  Dr. Melvern Banker.  It was his impression that [name] would need to have a  full cardiac workup to rule out coronary ischemia.  Also during this  admission, it was noted that her TSH was 0.06.  She was seen in consultation  by Dr. Rosario Jacks and he will follow her up also as an outpatient.  By May 21, 2004, [name] was improved, her abdominal pain was significantly  improved, heart rate was in Michelle 50 to 60 range, she was not having chest  pain, her labs were normal.  It was felt that she could be discharged at  that time and have completion of her cardiac and her thyroid problems as an  outpatient.  She was discharged in satisfactory condition.      LCS/MEDQ  D:  06/21/2004  T:  06/21/2004  Job:  [id number]

## 2010-09-04 NOTE — Consult Note (Signed)
[name], [name]                 ACCOUNT NO.:  [id number]   MEDICAL RECORD NO.:  [id number]          PATIENT TYPE:  AMB   LOCATION:  DAY                           FACILITY:  [facility]   PHYSICIAN:  Hildred Laser, M.D.    DATE OF BIRTH:  01-10-1961   DATE OF CONSULTATION:  12/30/2005  DATE OF DISCHARGE:                                   CONSULTATION   REASON FOR CONSULTATION:  For evaluation and management of her Crohn's  disease.  Possible colonoscopy.   HISTORY OF PRESENT ILLNESS:  [name] is a 50 year old Caucasian female who is  referred through courtesy of Dr. Weston Settle for a for GI evaluation.  The patient is well known to me although she has not been seen for Crohn's  disease since March 1998.  Her IBD was essentially attended by Dr. Irving Shows until he left town.   [name]'s Crohn's disease was diagnosed in November 05, 1991 when she presented  with abdominal pain and was found to have a mass in her abdomen.  She had  diagnostic laparoscopy by Dr. Irving Shows in November 1993.  She was  initially felt to have diverticulitis but did not respond to antibiotic  therapy.  She had a diagnostic laparoscopy/laparotomy by Dr. Irving Shows  with right hemicolectomy as well as a sigmoid colectomy.  She had ileal  disease with perforation extending into the sigmoid colon and a pelvic  abscess.  She was in the hospital for several days.  She believes she has  had one more surgery possibly in 2000 with removal of small-bowel segments.  She has been on sulfasalazine and mesalamine in the past but presently on  prednisone.   She states she does not feel well.  She states she has been under a lot of  stress for the last 2 months.  She has chronic diarrhea.  She does not even  recall the last time she had formed stool.  On her best day she has two to  three bowel movements per day and on her worst days she has as many as 12  stools with diarrhea both day and night.  She says her appetite is  fair but  not normal.  She denies nausea or vomiting.  She complains of pain in her  upper and midabdomen which is experienced every day and at times intense.  She denies fever, chills, melena, rectal bleeding, or weight loss.  A review  of her weight record reveals that back in 1994 she was 104 pounds, and she  at one point weighed as much as 109 pounds.  She did have a flexible  sigmoidoscopy by me in March 1995 which revealed anorectal Crohn's disease.  At that time she was treated with prednisone, Flagyl, and Pentasa.   She is on oxycodone 20 mg t.i.d., Levsin SL one t.i.d., prednisone 30 mg  q.a.m., loperamide 2 to 12 mg per day in divided dose, Prevacid 30 mg daily  p.r.n., promethazine 25 mg b.i.d. p.r.n., Valium 10 mg b.i.d. p.r.n. MVI  q.d. and Geltabs 2 tablets b.i.d.  PAST MEDICAL HISTORY:  1. History of Crohn's disease since 1993, as above.  2. She has had chronic GERD for at least 20 years.  3. She has a small sliding hiatal hernia.  4. She had bone density study by Dr. Irving Shows 7 or 8 years ago which      was normal, but she had one recently and was diagnosed with      osteoporosis.  5. She has a stress disorder.  6. Chronic musculoskeletal and joint pain involving the left half of her      body.   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 Glo Herring.  While I do not have pathology  results, the fistula would appear to be due to her Crohn's disease.   She had cholecystectomy in 2003.  One month ago she had an incisional  herniorrhaphy by Dr. Arnoldo Morale.   ALLERGIES:  NONE KNOWN.   FAMILY HISTORY:  Mother has dementia at age 38 and is cared for at [facility].  Father died of massive MI at age 18.  She has a sister with diabetes  mellitus, and brother's health status is unknown.   SOCIAL HISTORY:  She is separated.  She has two  children ages 26 and 1.  She states she is expecting, and she is looking forward to having grand  babies soon.  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  She had been smoking cigarettes for  30 years, presently half a pack a day, and Dr. Jonna Munro was trying to work  with her so she can successfully quit cigarette smoking.  She drinks alcohol  occasionally.   PHYSICAL EXAMINATION:  GENERAL:  Pleasant, thin Caucasian female who is in  no acute distress.  VITAL SIGNS:  She weighs 104 pounds.  She is 5 feet 3 inches tall.  Pulse 84  per minute, blood pressure 132/80, temperature is 98.  HEENT:  Conjunctivae is pink.  Sclerae is nonicteric.  Oral pharyngeal  mucosa is normal.  NECK:  No neck masses or thyromegaly noted.  CARDIAC:  Cardiac exam with regular rhythm.  Normal S1 and S3.  No murmur or  gallop noted.  LUNGS:  Clear to auscultation.  ABDOMEN:  Symmetrical.  She has a well-healed lower midline scar.  Bowel  sounds are normal.  Palpation reveals soft abdomen with mild tenderness at  both iliac fossae as well as epigastric area.  No organomegaly or masses  noted.  RECTAL:  Examination deferred.  EXTREMITIES:  She does not have clubbing or peripheral edema.   Labs from 11/29/2005, WBC 8.3, H&H is 14.0 and 40.6, platelet count is 229,  kdif is normal.  Comprehensive chemistry panel is normal.  Amylase 39,  lipase less than 10.  TSH 1.86.   She had a bone scan on December 16, 2005 which revealed minimal trace or  localization at the sternomanubrial junction which was felt to be  nonspecific.   ASSESSMENT:  Keliyah is a 50 year old Caucasian female with history of small  bowel and anorectal Crohn's disease which was initially diagnosed in late  1993 who has chronic diarrhea and upper and lower abdominal pain.  She has  not been on any maintenance therapy for awhile, although she is presently on prednisone.  Given that she  has  osteoporosis, she needs to get off  prednisone as soon as feasible, but she needs to be on some maintenance  therapy.  Her CBC is normal.  Her comprehensive chemistry panel is normal.  It remains to be seen whether or not her Crohn's disease is active.  Since  she has history of anorectal Crohn's disease, I agree with Dr. Jonna Munro that  she will need to undergo colonoscopy in the near future both for diagnostic  and surveillance purposes, as she has had the disease for more than 10  years.   RECOMMENDATIONS:  She will have a sed rate, CRP, upper GI with small-bowel  follow-through as soon as possible.  She should take Prevacid every day  rather than p.r.n. since she is having epigastric pain.  Next, discontinue  Levsin start Levbid 1 tablet every morning.  She is advised to take  loperamide 2 to 4 mg every morning for few days and see if she can cut down  on the frequency of bowel movements.  She needs to start calcium with  vitamin D at least 1.2 grams daily.  Further recommendations will be coming  from Dr. Jonna Munro.   Colonoscopy will be scheduled once upper GI small-bowel follow-through has  been completed.   She will be begun on mesalamine and possibly 6-MP if her disease is active.   I have also encouraged the patient to do her best to try to quit cigarette  smoking, as smoking has a worsening effect on Crohn's disease activity.   We would like to thank Dr. Jonna Munro for the opportunity to participate in  care of this nice lady.      Hildred Laser, M.D.  Electronically Signed     NR/MEDQ  D:  12/30/2005  T:  12/31/2005  Job:  [id number]   cc:   Weston Settle, M.D.

## 2010-10-01 ENCOUNTER — Ambulatory Visit (INDEPENDENT_AMBULATORY_CARE_PROVIDER_SITE_OTHER): Payer: Medicare Other | Admitting: Internal Medicine

## 2010-10-01 DIAGNOSIS — R1030 Lower abdominal pain, unspecified: Secondary | ICD-10-CM

## 2010-10-01 DIAGNOSIS — K5090 Crohn's disease, unspecified, without complications: Secondary | ICD-10-CM

## 2010-10-01 DIAGNOSIS — R630 Anorexia: Secondary | ICD-10-CM

## 2010-10-01 DIAGNOSIS — R197 Diarrhea, unspecified: Secondary | ICD-10-CM

## 2010-10-01 DIAGNOSIS — R1110 Vomiting, unspecified: Secondary | ICD-10-CM

## 2010-10-01 DIAGNOSIS — K5000 Crohn's disease of small intestine without complications: Secondary | ICD-10-CM

## 2010-10-01 DIAGNOSIS — K625 Hemorrhage of anus and rectum: Secondary | ICD-10-CM

## 2010-10-01 HISTORY — DX: Hemorrhage of anus and rectum: K62.5

## 2010-10-01 HISTORY — DX: Lower abdominal pain, unspecified: R10.30

## 2010-10-01 HISTORY — DX: Crohn's disease, unspecified, without complications: K50.90

## 2010-10-01 HISTORY — DX: Vomiting, unspecified: R11.10

## 2010-10-01 HISTORY — DX: Diarrhea, unspecified: R19.7

## 2010-10-01 HISTORY — DX: Anorexia: R63.0

## 2010-11-12 ENCOUNTER — Encounter (INDEPENDENT_AMBULATORY_CARE_PROVIDER_SITE_OTHER): Payer: Self-pay

## 2010-12-29 ENCOUNTER — Ambulatory Visit (INDEPENDENT_AMBULATORY_CARE_PROVIDER_SITE_OTHER): Payer: Medicare Other | Admitting: Internal Medicine

## 2010-12-29 ENCOUNTER — Encounter (INDEPENDENT_AMBULATORY_CARE_PROVIDER_SITE_OTHER): Payer: Self-pay | Admitting: Internal Medicine

## 2010-12-29 VITALS — BP 112/72 | HR 80 | Temp 97.8°F | Ht 62.0 in | Wt 114.4 lb

## 2010-12-29 DIAGNOSIS — K5090 Crohn's disease, unspecified, without complications: Secondary | ICD-10-CM

## 2010-12-29 MED ORDER — CIPROFLOXACIN HCL 500 MG PO TABS: 500.0000 mg | ORAL_TABLET | Freq: Two times a day (BID) | ORAL | Status: AC

## 2010-12-29 NOTE — Progress Notes (Signed)
Subjective:     Patient ID: [name], female   DOB: 02/06/1961, 50 y.o.   MRN: [id number]  HPI  [name] presents today for f/u of her small bowel Crohn's.  She was last seen in June of this year for rectal bleeding, lower abdominal pain, diarrhea, nausea, and anorexia.  She had had symptoms for 3-5 weeks.   She was started on Entocort 36m daily x 2 weeks, and then taper. She has been on 6-MP since October of 2007 when she underwent a colonoscopy which revealed ileal and rectal disease.  She tells me today she is not having any abdominal pain. She does  c/o urinary tract infection. Frequent urination. Symptoms x 1 days. Taken Cipro before.  No abdominal pain.  She does c/o arthritis.  Appetite is fair. No weight loss. BMs x 1 a day usually. Stools are solid. Hx of small bowel Crohn's.   Her last colonoscopy was 2 yrs ago;  No melena or bright red rectal bleeding.  No NAIDS.  No chills or fever. 09/24/2010 H and H 14.5 and 41.2, MCV 101.7, Platelet ct 167, CRP 0.1.  Review of Systems see hpi Current Outpatient Prescriptions  Medication Sig Dispense Refill  . ALPRAZolam (XANAX) 0.5 MG tablet Take 0.5 mg by mouth at bedtime as needed.        . Ascorbic Acid (VITAMIN C) 1000 MG tablet Take 1,000 mg by mouth daily.        . calcium-vitamin D 250-100 MG-UNIT per tablet Take 1 tablet by mouth.        . gelatin 650 MG capsule Take by mouth daily.        . mercaptopurine (PURINETHOL) 50 MG tablet Take 50 mg by mouth daily. Give on an empty stomach 1 hour before or 2 hours after meals. Caution: Chemotherapy.       . multivitamin (THERAGRAN) per tablet Take 1 tablet by mouth daily.        .Marland Kitchenomeprazole (PRILOSEC) 20 MG capsule Take 20 mg by mouth daily.        .Marland KitchenoxyCODONE (OXYCONTIN) 40 MG 12 hr tablet Take 40 mg by mouth.        . ciprofloxacin (CIPRO) 500 MG tablet Take 1 tablet (500 mg total) by mouth 2 (two) times daily.  14 tablet  0   Past Surgical History  Procedure Date  . Colonoscopy 01/2006   October 2007 revealinga ileal ulcers proximal to anastomosis without stricture and a single rectal ulcer.   Past Medical History  Diagnosis Date  . Rectal bleeding 10/01/2010  . Lower abdominal pain 10/01/2010  . Diarrhea 10/01/2010  . Anorexia 10/01/2010  . Emesis 10/01/2010  . Crohn's 10/01/2010  . GERD (gastroesophageal reflux disease)   . Chronic pain syndrome   . Arthritis         Objective:   Physical Exam Alert and oriented. Skin warm and dry. Oral mucosa is moist. Natural teeth in good condition. Sclera anicteric, conjunctivae is pink. Thyroid not enlarged. No cervical lymphadenopathy. Lungs clear. Heart regular rate and rhythm.  Abdomen is soft. Bowel sounds are positive. No hepatomegaly. No abdominal masses felt. No tenderness. Midline scar noted from previous abdominal surgeries.  No edema to lower extremities. Patient is alert and oriented.      Assessment:    Crohn's disease. She appears to be in remission. No abdominal pain. No diarrhea, melena or bright red rectal bleeding.  UTI: Will empirically treat her with Cipro.  Plan:       Office visit in 3 months. Will get a CBC and sed rate on her today. Rx for Cipro 528m BID x 7 days for probably UTI.

## 2010-12-30 LAB — CBC WITH DIFFERENTIAL/PLATELET
Basophils Absolute: 0 10*3/uL (ref 0.0–0.1)
Basophils Relative: 0 % (ref 0–1)
Eosinophils Absolute: 0.1 10*3/uL (ref 0.0–0.7)
Eosinophils Relative: 1 % (ref 0–5)
HCT: 42 % (ref 36.0–46.0)
Hemoglobin: 14.8 g/dL (ref 12.0–15.0)
Lymphocytes Relative: 15 % (ref 12–46)
Lymphs Abs: 1.3 10*3/uL (ref 0.7–4.0)
MCH: 35.2 pg — ABNORMAL HIGH (ref 26.0–34.0)
MCHC: 35.2 g/dL (ref 30.0–36.0)
MCV: 99.8 fL (ref 78.0–100.0)
Monocytes Absolute: 0.6 10*3/uL (ref 0.1–1.0)
Monocytes Relative: 7 % (ref 3–12)
Neutro Abs: 6.3 10*3/uL (ref 1.7–7.7)
Neutrophils Relative %: 77 % (ref 43–77)
Platelets: 216 10*3/uL (ref 150–400)
RBC: 4.21 MIL/uL (ref 3.87–5.11)
RDW: 14 % (ref 11.5–15.5)
WBC: 8.2 10*3/uL (ref 4.0–10.5)

## 2010-12-30 LAB — C-REACTIVE PROTEIN: CRP: 0.11 mg/dL (ref <0.60)

## 2011-03-31 ENCOUNTER — Encounter (INDEPENDENT_AMBULATORY_CARE_PROVIDER_SITE_OTHER): Payer: Self-pay | Admitting: *Deleted

## 2011-04-06 ENCOUNTER — Other Ambulatory Visit (INDEPENDENT_AMBULATORY_CARE_PROVIDER_SITE_OTHER): Payer: Self-pay | Admitting: Internal Medicine

## 2011-04-08 ENCOUNTER — Ambulatory Visit (INDEPENDENT_AMBULATORY_CARE_PROVIDER_SITE_OTHER): Payer: Medicare Other | Admitting: Internal Medicine

## 2011-04-22 ENCOUNTER — Ambulatory Visit (INDEPENDENT_AMBULATORY_CARE_PROVIDER_SITE_OTHER): Payer: Medicare Other | Admitting: Internal Medicine

## 2011-04-24 ENCOUNTER — Other Ambulatory Visit (INDEPENDENT_AMBULATORY_CARE_PROVIDER_SITE_OTHER): Payer: Self-pay | Admitting: Internal Medicine

## 2011-05-10 ENCOUNTER — Other Ambulatory Visit (INDEPENDENT_AMBULATORY_CARE_PROVIDER_SITE_OTHER): Payer: Self-pay | Admitting: Internal Medicine

## 2011-08-02 ENCOUNTER — Ambulatory Visit (INDEPENDENT_AMBULATORY_CARE_PROVIDER_SITE_OTHER): Payer: Medicare Other | Admitting: Internal Medicine

## 2011-08-02 ENCOUNTER — Encounter (INDEPENDENT_AMBULATORY_CARE_PROVIDER_SITE_OTHER): Payer: Self-pay | Admitting: Internal Medicine

## 2011-08-02 VITALS — BP 110/70 | HR 74 | Temp 97.4°F | Resp 18 | Ht 63.0 in | Wt 121.7 lb

## 2011-08-02 DIAGNOSIS — M8580 Other specified disorders of bone density and structure, unspecified site: Secondary | ICD-10-CM

## 2011-08-02 DIAGNOSIS — M949 Disorder of cartilage, unspecified: Secondary | ICD-10-CM

## 2011-08-02 DIAGNOSIS — M899 Disorder of bone, unspecified: Secondary | ICD-10-CM

## 2011-08-02 DIAGNOSIS — K5090 Crohn's disease, unspecified, without complications: Secondary | ICD-10-CM

## 2011-08-02 MED ORDER — CALCIUM CITRATE-VITAMIN D 250-100 MG-UNIT PO TABS: 1.0000 | ORAL_TABLET | Freq: Two times a day (BID) | ORAL | Status: DC

## 2011-08-02 NOTE — Progress Notes (Signed)
Presenting complaint; Followup for small bowel Crohn disease  Subjective: [name] is 51 year old Caucasian female, patient of Dr. Luan Pulling, who is here for scheduled visit.  She was last seen on 2011-01-15.  She states she is doing well.  Several weeks ago, she became constipated and decided to drop dose of 6-MP to 50 mg daily.  Other than that, she has not had any problems.  She states she has a very good appetite.  She denies abdominal pain, melena, or rectal bleeding.  She says the heartburn is well controlled with therapy.  She has an appointment with Dr. Luan Pulling later this week.  She states her pain has been well controlled with current dose of OxyContin, which she gets from Dr. Luan Pulling.   The patient's last colonoscopy was in October 2007  Current Medications: Current Outpatient Prescriptions  Medication Sig Dispense Refill  . ALPRAZolam (XANAX) 0.5 MG tablet Take 0.5 mg by mouth at bedtime as needed.        . Ascorbic Acid (VITAMIN C) 1000 MG tablet Take 1,000 mg by mouth daily.        . calcium-vitamin D 250-100 MG-UNIT per tablet Take 1 tablet by mouth.        . gelatin 650 MG capsule Take by mouth daily.        . mercaptopurine (PURINETHOL) 50 MG tablet TAKE 1 1/2 TABLET BY MOUTH EVERY DAY  45 tablet  5  . multivitamin (THERAGRAN) per tablet Take 1 tablet by mouth daily.        Marland Kitchen omeprazole (PRILOSEC) 20 MG capsule TAKE ONE CAPSULE BY MOUTH ONCE A DAY  30 capsule  5  . oxyCODONE (OXYCONTIN) 40 MG 12 hr tablet Take 40 mg by mouth.           Objective: Blood pressure 110/70, pulse 74, temperature 97.4 F (36.3 C), temperature source Oral, resp. rate 18, height 5' 3"  (1.6 m), weight 121 lb 11.2 oz (55.203 kg).  HEENT:  Conjunctivae pink.  Sclerae nonicteric.  Oropharyngeal mucosa is normal.  She has dentures in place.  NECK:  No neck masses or thyromegaly noted.  CARDIAC:  Regular rhythm.  Normal S1 and S2.  No murmur or gallop noted.  LUNGS:  Clear to auscultation.  ABDOMEN:   Symmetrical.  Bowel sounds are normal.  On palpation, soft and nontender without organomegaly or masses.  EXTREMITIES:  No peripheral edema or clubbing noted.  Labs/studies Results: Last CBC was on 01-15-2011 with H and H of 14.8 and 42, WBC of 8.2, and platelet count was 216 K.  She is long over due for CBC  Assessment: 1. Small bowel Crohn disease.  She remains in remission.  She needs to be on 75 mg daily.  Should she get constipated, she can try Colace and/or MiraLax in addition to high-fiber diet.  If these therapies are not effective, she will call us. 2. History of osteopenia.  Need to check her vitamin D level. 3.     Chronic gastroesophageal reflux disease symptoms,                well controlled with therapy  Plan:  She will go to the lab for CBC with diff and vitamin D2 levels.   The patient advised to stay on 6-MP 75 mg p.o. daily.   We will contact the patient's results of blood work and plan to see her back in 6 months.

## 2011-08-02 NOTE — Patient Instructions (Signed)
Physician will contact you with results of blood work.

## 2011-08-09 ENCOUNTER — Other Ambulatory Visit (INDEPENDENT_AMBULATORY_CARE_PROVIDER_SITE_OTHER): Payer: Self-pay | Admitting: Internal Medicine

## 2011-08-21 LAB — CBC WITH DIFFERENTIAL/PLATELET
Basophils Absolute: 0 K/uL (ref 0.0–0.1)
Basophils Relative: 0 % (ref 0–1)
Eosinophils Absolute: 0.1 K/uL (ref 0.0–0.7)
Eosinophils Relative: 1 % (ref 0–5)
HCT: 41 % (ref 36.0–46.0)
Hemoglobin: 14 g/dL (ref 12.0–15.0)
Lymphocytes Relative: 24 % (ref 12–46)
Lymphs Abs: 1.2 K/uL (ref 0.7–4.0)
MCH: 34.7 pg — ABNORMAL HIGH (ref 26.0–34.0)
MCHC: 34.1 g/dL (ref 30.0–36.0)
MCV: 101.5 fL — ABNORMAL HIGH (ref 78.0–100.0)
Monocytes Absolute: 0.2 K/uL (ref 0.1–1.0)
Monocytes Relative: 3 % (ref 3–12)
Neutro Abs: 3.7 K/uL (ref 1.7–7.7)
Neutrophils Relative %: 71 % (ref 43–77)
Platelets: 217 K/uL (ref 150–400)
RBC: 4.04 MIL/uL (ref 3.87–5.11)
RDW: 13.8 % (ref 11.5–15.5)
WBC: 5.2 K/uL (ref 4.0–10.5)

## 2011-08-21 LAB — VITAMIN D 25 HYDROXY (VIT D DEFICIENCY, FRACTURES): Vit D, 25-Hydroxy: 25 ng/mL — ABNORMAL LOW (ref 30–89)

## 2011-09-01 ENCOUNTER — Telehealth (INDEPENDENT_AMBULATORY_CARE_PROVIDER_SITE_OTHER): Payer: Self-pay | Admitting: *Deleted

## 2011-09-01 DIAGNOSIS — K5090 Crohn's disease, unspecified, without complications: Secondary | ICD-10-CM

## 2011-09-01 DIAGNOSIS — E559 Vitamin D deficiency, unspecified: Secondary | ICD-10-CM

## 2011-09-01 NOTE — Telephone Encounter (Signed)
Patient to take 5000 units of Vitamin D  1 a day for 4 weeks , patient states that she started 08/28/11. Lab is noted for 09/27/11.

## 2011-09-01 NOTE — Telephone Encounter (Signed)
Per Dr. Laural Golden the patient will need to have her CBC/D in 3 months

## 2011-09-17 ENCOUNTER — Other Ambulatory Visit (INDEPENDENT_AMBULATORY_CARE_PROVIDER_SITE_OTHER): Payer: Self-pay | Admitting: *Deleted

## 2011-09-17 ENCOUNTER — Encounter (INDEPENDENT_AMBULATORY_CARE_PROVIDER_SITE_OTHER): Payer: Self-pay | Admitting: *Deleted

## 2011-09-17 DIAGNOSIS — K5090 Crohn's disease, unspecified, without complications: Secondary | ICD-10-CM

## 2011-09-17 DIAGNOSIS — E559 Vitamin D deficiency, unspecified: Secondary | ICD-10-CM

## 2011-10-22 ENCOUNTER — Telehealth (INDEPENDENT_AMBULATORY_CARE_PROVIDER_SITE_OTHER): Payer: Self-pay | Admitting: *Deleted

## 2011-10-22 DIAGNOSIS — R5383 Other fatigue: Secondary | ICD-10-CM

## 2011-10-22 DIAGNOSIS — K5010 Crohn's disease of large intestine without complications: Secondary | ICD-10-CM

## 2011-10-22 DIAGNOSIS — M8580 Other specified disorders of bone density and structure, unspecified site: Secondary | ICD-10-CM

## 2011-10-22 NOTE — Telephone Encounter (Signed)
These are labs that were to be done earlier. Patient calls this morning and states that she feels dizzy and was going to have lab work done.

## 2011-10-25 ENCOUNTER — Other Ambulatory Visit (INDEPENDENT_AMBULATORY_CARE_PROVIDER_SITE_OTHER): Payer: Self-pay | Admitting: Internal Medicine

## 2011-10-25 LAB — CBC WITH DIFFERENTIAL/PLATELET
Basophils Absolute: 0 10*3/uL (ref 0.0–0.1)
Basophils Relative: 0 % (ref 0–1)
Eosinophils Absolute: 0 10*3/uL (ref 0.0–0.7)
Eosinophils Relative: 1 % (ref 0–5)
HCT: 39.8 % (ref 36.0–46.0)
Hemoglobin: 13.8 g/dL (ref 12.0–15.0)
Lymphocytes Relative: 22 % (ref 12–46)
Lymphs Abs: 1 10*3/uL (ref 0.7–4.0)
MCH: 34.9 pg — ABNORMAL HIGH (ref 26.0–34.0)
MCHC: 34.7 g/dL (ref 30.0–36.0)
MCV: 100.8 fL — ABNORMAL HIGH (ref 78.0–100.0)
Monocytes Absolute: 0.4 10*3/uL (ref 0.1–1.0)
Monocytes Relative: 8 % (ref 3–12)
Neutro Abs: 3.2 10*3/uL (ref 1.7–7.7)
Neutrophils Relative %: 69 % (ref 43–77)
Platelets: 171 10*3/uL (ref 150–400)
RBC: 3.95 MIL/uL (ref 3.87–5.11)
RDW: 13.9 % (ref 11.5–15.5)
WBC: 4.6 10*3/uL (ref 4.0–10.5)

## 2011-10-26 ENCOUNTER — Telehealth (INDEPENDENT_AMBULATORY_CARE_PROVIDER_SITE_OTHER): Payer: Self-pay | Admitting: *Deleted

## 2011-10-26 DIAGNOSIS — K219 Gastro-esophageal reflux disease without esophagitis: Secondary | ICD-10-CM

## 2011-10-26 DIAGNOSIS — K5090 Crohn's disease, unspecified, without complications: Secondary | ICD-10-CM

## 2011-10-26 LAB — VITAMIN D 25 HYDROXY (VIT D DEFICIENCY, FRACTURES): Vit D, 25-Hydroxy: 41 ng/mL (ref 30–89)

## 2011-10-26 NOTE — Telephone Encounter (Signed)
Per Dr.Rehman the patient will need to have CBC/D in 4 months,lab noted for November 2013

## 2011-11-04 ENCOUNTER — Other Ambulatory Visit (INDEPENDENT_AMBULATORY_CARE_PROVIDER_SITE_OTHER): Payer: Self-pay | Admitting: Internal Medicine

## 2012-02-16 ENCOUNTER — Encounter (INDEPENDENT_AMBULATORY_CARE_PROVIDER_SITE_OTHER): Payer: Self-pay | Admitting: *Deleted

## 2012-02-16 ENCOUNTER — Telehealth (INDEPENDENT_AMBULATORY_CARE_PROVIDER_SITE_OTHER): Payer: Self-pay | Admitting: *Deleted

## 2012-02-16 DIAGNOSIS — K219 Gastro-esophageal reflux disease without esophagitis: Secondary | ICD-10-CM

## 2012-02-16 DIAGNOSIS — K5090 Crohn's disease, unspecified, without complications: Secondary | ICD-10-CM

## 2012-02-16 NOTE — Telephone Encounter (Signed)
Lab work due

## 2012-02-25 ENCOUNTER — Other Ambulatory Visit (INDEPENDENT_AMBULATORY_CARE_PROVIDER_SITE_OTHER): Payer: Self-pay | Admitting: Internal Medicine

## 2012-08-03 ENCOUNTER — Encounter (INDEPENDENT_AMBULATORY_CARE_PROVIDER_SITE_OTHER): Payer: Self-pay | Admitting: *Deleted

## 2012-08-14 ENCOUNTER — Ambulatory Visit (INDEPENDENT_AMBULATORY_CARE_PROVIDER_SITE_OTHER): Payer: Medicare Other | Admitting: Internal Medicine

## 2012-08-21 ENCOUNTER — Ambulatory Visit (INDEPENDENT_AMBULATORY_CARE_PROVIDER_SITE_OTHER): Payer: Medicare Other | Admitting: Internal Medicine

## 2012-11-01 ENCOUNTER — Other Ambulatory Visit (INDEPENDENT_AMBULATORY_CARE_PROVIDER_SITE_OTHER): Payer: Self-pay | Admitting: Internal Medicine

## 2012-11-07 ENCOUNTER — Ambulatory Visit (INDEPENDENT_AMBULATORY_CARE_PROVIDER_SITE_OTHER): Payer: Medicare Other | Admitting: Internal Medicine

## 2012-11-07 ENCOUNTER — Encounter (INDEPENDENT_AMBULATORY_CARE_PROVIDER_SITE_OTHER): Payer: Self-pay | Admitting: Internal Medicine

## 2012-11-07 VITALS — BP 104/68 | HR 70 | Temp 98.2°F | Resp 18 | Ht 62.0 in | Wt 114.4 lb

## 2012-11-07 DIAGNOSIS — K5090 Crohn's disease, unspecified, without complications: Secondary | ICD-10-CM

## 2012-11-07 DIAGNOSIS — R161 Splenomegaly, not elsewhere classified: Secondary | ICD-10-CM | POA: Insufficient documentation

## 2012-11-07 DIAGNOSIS — K219 Gastro-esophageal reflux disease without esophagitis: Secondary | ICD-10-CM

## 2012-11-07 MED ORDER — PANTOPRAZOLE SODIUM 40 MG PO TBEC: 40.0000 mg | DELAYED_RELEASE_TABLET | Freq: Every day | ORAL | Status: DC

## 2012-11-07 NOTE — Patient Instructions (Signed)
Physician will contact you with results of blood work and CT when completed.

## 2012-11-07 NOTE — Progress Notes (Signed)
Presenting complaint;  Followup for small bowel Crohn's disease.  Subjective:  Patient is 52 year old Caucasian female with history of small bowel Crohn disease who is here for scheduled visit. She was last seen in April 2013.  She does not feel well. She returned from trip from [place] on 10/12/2012 and has not felt well since then. She has been dizzy at times has an imbalance. She has not experienced headache fever or chills. She has been nauseated and noted drop in her appetite. She vomited yesterday morning but none since then. She denies fever or chills. She has lost 7 pounds since her last visit. She states when she returned from [place] for a couple of days she felt like she had a flu but the symptoms resolved spontaneously. She also complains of daily heartburn. She has doubled up on her omeprazole but is not working as well as it did in the past. She has 1-2 formed stools daily. She denies melena or rectal bleeding. She has occasional fleeting lower abdominal pain.  Current Medications: Current Outpatient Prescriptions  Medication Sig Dispense Refill  . ALPRAZolam (XANAX) 0.5 MG tablet Take 0.5 mg by mouth at bedtime as needed.        . calcium-vitamin D 250-100 MG-UNIT per tablet Take 1 tablet by mouth 2 (two) times daily.  60 tablet  11  . gelatin 650 MG capsule Take by mouth daily.        . mercaptopurine (PURINETHOL) 50 MG tablet TAKE 1 1/2 TABLET BY MOUTH EVERY DAY  45 tablet  5  . multivitamin (THERAGRAN) per tablet Take 1 tablet by mouth daily.        Marland Kitchen omeprazole (PRILOSEC) 20 MG capsule TAKE 1 CAPSULE BY MOUTH EVERY DAY  30 capsule  11  . oxyCODONE (OXYCONTIN) 40 MG 12 hr tablet Take 40 mg by mouth.        . promethazine (PHENERGAN) 25 MG tablet Take 25 mg by mouth as needed for nausea.       No current facility-administered medications for this visit.     Objective: Blood pressure 104/68, pulse 70, temperature 98.2 F (36.8 C), temperature source Oral, resp. rate 18,  height 5' 2"  (1.575 m), weight 114 lb 6.4 oz (51.891 kg). Patient is alert and in no acute distress. Conjunctiva is pink. Sclera is nonicteric Pupils are equal and reactive to light. EOMI no nystagmus noted. Both eardrums appear normal. Oropharyngeal mucosa is normal. No neck masses or thyromegaly noted. Cardiac exam with regular rhythm normal S1 and S2. No murmur or gallop noted. Lungs are clear to auscultation. Abdomen is symmetrical. It is soft and nontender. Spleen is palpable. Liver edge is indistinct below RCM but does not appear to be enlarged  No LE edema or clubbing noted.  Labs/studies Results: Last CBC from 10/25/2011. WBC 4.6 H&H 13.8 and 39.8 Platelet count 171K. MCV 100.8 Differential normal. Letter was sent to the patient in October 2013 for CBC but she did not respond.    Assessment:  #1. Crohn's disease primarily involving ileum. She appears to be in remission. Patient is long overdue for CBC which was last done one year ago.  #2. Splenomegaly. This is a new finding. Enlarged spleen was not noted on her exam of May 2013 and CT of 2007 also did not reveal splenomegaly. Patient has been on 6-MP chronically and therefore one has to worry about hematologic side effects. #3. GERD. Symptoms not well controlled with current therapy. #4. Recent onset of dizziness and  imbalance falling air travel to [place]. Possible inner ear disease. If she does not feel better in the next few days she needs to follow with Dr. Luan Pulling. #5. Cigarette smoking. She is trying to quit and now down to 3-4 cigarettes per day.  Plan: Patient encouraged to continue her effort to quit cigarette smoking. Discontinue omeprazole. Begin pantoprazole 40 mg by mouth every morning. Abdominal pelvic CT with contrast. Patient will go to the lab for CBC with differential, sedimentation rate and comprehensive chemistry panel. Further recommendations to follow.

## 2012-11-08 LAB — CBC WITH DIFFERENTIAL/PLATELET
Basophils Absolute: 0 10*3/uL (ref 0.0–0.1)
Basophils Relative: 1 % (ref 0–1)
Eosinophils Absolute: 0.1 10*3/uL (ref 0.0–0.7)
Eosinophils Relative: 1 % (ref 0–5)
HCT: 38.7 % (ref 36.0–46.0)
Hemoglobin: 13.3 g/dL (ref 12.0–15.0)
Lymphocytes Relative: 43 % (ref 12–46)
Lymphs Abs: 1.8 10*3/uL (ref 0.7–4.0)
MCH: 35.7 pg — ABNORMAL HIGH (ref 26.0–34.0)
MCHC: 34.4 g/dL (ref 30.0–36.0)
MCV: 103.8 fL — ABNORMAL HIGH (ref 78.0–100.0)
Monocytes Absolute: 0.4 10*3/uL (ref 0.1–1.0)
Monocytes Relative: 10 % (ref 3–12)
Neutro Abs: 1.9 10*3/uL (ref 1.7–7.7)
Neutrophils Relative %: 45 % (ref 43–77)
Platelets: 172 10*3/uL (ref 150–400)
RBC: 3.73 MIL/uL — ABNORMAL LOW (ref 3.87–5.11)
RDW: 15 % (ref 11.5–15.5)
WBC: 4.1 10*3/uL (ref 4.0–10.5)

## 2012-11-08 LAB — COMPREHENSIVE METABOLIC PANEL
ALT: 47 U/L — ABNORMAL HIGH (ref 0–35)
AST: 44 U/L — ABNORMAL HIGH (ref 0–37)
Albumin: 4.4 g/dL (ref 3.5–5.2)
Alkaline Phosphatase: 108 U/L (ref 39–117)
BUN: 8 mg/dL (ref 6–23)
CO2: 31 mEq/L (ref 19–32)
Calcium: 9.8 mg/dL (ref 8.4–10.5)
Chloride: 104 mEq/L (ref 96–112)
Creat: 0.78 mg/dL (ref 0.50–1.10)
Glucose, Bld: 74 mg/dL (ref 70–99)
Potassium: 5 mEq/L (ref 3.5–5.3)
Sodium: 144 mEq/L (ref 135–145)
Total Bilirubin: 0.3 mg/dL (ref 0.3–1.2)
Total Protein: 7.5 g/dL (ref 6.0–8.3)

## 2012-11-08 LAB — SEDIMENTATION RATE: Sed Rate: 28 mm/hr — ABNORMAL HIGH (ref 0–22)

## 2012-11-09 ENCOUNTER — Ambulatory Visit (HOSPITAL_COMMUNITY)
Admission: RE | Admit: 2012-11-09 | Discharge: 2012-11-09 | Disposition: A | Discharge status: Home / Self Care | Payer: PRIVATE HEALTH INSURANCE | Source: Ambulatory Visit | Attending: Internal Medicine | Admitting: Internal Medicine

## 2012-11-09 ENCOUNTER — Other Ambulatory Visit (HOSPITAL_COMMUNITY): Payer: Self-pay | Admitting: Pulmonary Disease

## 2012-11-09 DIAGNOSIS — K5090 Crohn's disease, unspecified, without complications: Secondary | ICD-10-CM | POA: Insufficient documentation

## 2012-11-09 DIAGNOSIS — Z139 Encounter for screening, unspecified: Secondary | ICD-10-CM

## 2012-11-09 DIAGNOSIS — K838 Other specified diseases of biliary tract: Secondary | ICD-10-CM | POA: Diagnosis not present

## 2012-11-09 DIAGNOSIS — K625 Hemorrhage of anus and rectum: Secondary | ICD-10-CM | POA: Insufficient documentation

## 2012-11-09 DIAGNOSIS — R161 Splenomegaly, not elsewhere classified: Secondary | ICD-10-CM

## 2012-11-09 MED ORDER — IOHEXOL 300 MG/ML  SOLN
100.0000 mL | Freq: Once | INTRAMUSCULAR | Status: AC | PRN
  Administered 2012-11-09: 100 mL via INTRAVENOUS

## 2012-11-10 ENCOUNTER — Encounter (INDEPENDENT_AMBULATORY_CARE_PROVIDER_SITE_OTHER): Payer: Self-pay | Admitting: *Deleted

## 2012-11-10 NOTE — Progress Notes (Signed)
This encounter was created in error - please disregard.

## 2012-11-13 ENCOUNTER — Telehealth (INDEPENDENT_AMBULATORY_CARE_PROVIDER_SITE_OTHER): Payer: Self-pay | Admitting: *Deleted

## 2012-11-13 DIAGNOSIS — K50918 Crohn's disease, unspecified, with other complication: Secondary | ICD-10-CM

## 2012-11-13 NOTE — Telephone Encounter (Signed)
Per Dr.Rehman the patient will need to have labs drawn, Hepatic Profile this week. CBC in 3 months.

## 2012-11-16 ENCOUNTER — Ambulatory Visit (HOSPITAL_COMMUNITY)
Admission: RE | Admit: 2012-11-16 | Discharge: 2012-11-16 | Disposition: A | Discharge status: Home / Self Care | Payer: PRIVATE HEALTH INSURANCE | Source: Ambulatory Visit | Attending: Pulmonary Disease | Admitting: Pulmonary Disease

## 2012-11-16 DIAGNOSIS — Z139 Encounter for screening, unspecified: Secondary | ICD-10-CM

## 2012-11-16 DIAGNOSIS — Z1231 Encounter for screening mammogram for malignant neoplasm of breast: Secondary | ICD-10-CM | POA: Insufficient documentation

## 2012-11-23 LAB — HEPATIC FUNCTION PANEL
ALT: 27 U/L (ref 0–35)
AST: 35 U/L (ref 0–37)
Albumin: 4.2 g/dL (ref 3.5–5.2)
Alkaline Phosphatase: 99 U/L (ref 39–117)
Bilirubin, Direct: 0.1 mg/dL (ref 0.0–0.3)
Indirect Bilirubin: 0.4 mg/dL (ref 0.0–0.9)
Total Bilirubin: 0.5 mg/dL (ref 0.3–1.2)
Total Protein: 7 g/dL (ref 6.0–8.3)

## 2012-11-28 ENCOUNTER — Telehealth (INDEPENDENT_AMBULATORY_CARE_PROVIDER_SITE_OTHER): Payer: Self-pay | Admitting: *Deleted

## 2012-11-28 DIAGNOSIS — K838 Other specified diseases of biliary tract: Secondary | ICD-10-CM

## 2012-11-28 DIAGNOSIS — R42 Dizziness and giddiness: Secondary | ICD-10-CM

## 2012-11-28 DIAGNOSIS — R110 Nausea: Secondary | ICD-10-CM

## 2012-11-28 NOTE — Telephone Encounter (Signed)
Apt has been scheduled for 01/23/13 with Dr. Laural Golden.

## 2012-11-28 NOTE — Telephone Encounter (Signed)
Per Dr.Rehman the patient needs to have another lab test-Hepatic Profile Patient will be called and made aware.

## 2012-11-28 NOTE — Telephone Encounter (Signed)
[name] is nauseated and dizzy. She said she was to call Dr. Laural Golden when this happened. I have scheduled her an 8 week f/u apt on 11/23/12 with Dr. Laural Golden. The return phone number is [phone].

## 2012-12-01 LAB — HEPATIC FUNCTION PANEL
ALT: 31 U/L (ref 0–35)
AST: 40 U/L — ABNORMAL HIGH (ref 0–37)
Albumin: 4.4 g/dL (ref 3.5–5.2)
Alkaline Phosphatase: 114 U/L (ref 39–117)
Bilirubin, Direct: 0.1 mg/dL (ref 0.0–0.3)
Indirect Bilirubin: 0.3 mg/dL (ref 0.0–0.9)
Total Bilirubin: 0.4 mg/dL (ref 0.3–1.2)
Total Protein: 6.9 g/dL (ref 6.0–8.3)

## 2012-12-15 ENCOUNTER — Other Ambulatory Visit (INDEPENDENT_AMBULATORY_CARE_PROVIDER_SITE_OTHER): Payer: Self-pay | Admitting: Internal Medicine

## 2013-01-17 ENCOUNTER — Encounter (INDEPENDENT_AMBULATORY_CARE_PROVIDER_SITE_OTHER): Payer: Self-pay | Admitting: *Deleted

## 2013-01-17 ENCOUNTER — Other Ambulatory Visit (INDEPENDENT_AMBULATORY_CARE_PROVIDER_SITE_OTHER): Payer: Self-pay | Admitting: *Deleted

## 2013-01-17 DIAGNOSIS — K50918 Crohn's disease, unspecified, with other complication: Secondary | ICD-10-CM

## 2013-01-23 ENCOUNTER — Ambulatory Visit (INDEPENDENT_AMBULATORY_CARE_PROVIDER_SITE_OTHER): Payer: PRIVATE HEALTH INSURANCE | Admitting: Internal Medicine

## 2013-01-24 ENCOUNTER — Telehealth (INDEPENDENT_AMBULATORY_CARE_PROVIDER_SITE_OTHER): Payer: Self-pay | Admitting: Internal Medicine

## 2013-01-24 NOTE — Telephone Encounter (Signed)
Will need LFTs and an [place] with her visit tomorrow.

## 2013-01-25 ENCOUNTER — Ambulatory Visit (INDEPENDENT_AMBULATORY_CARE_PROVIDER_SITE_OTHER): Payer: PRIVATE HEALTH INSURANCE | Admitting: Internal Medicine

## 2013-02-14 LAB — CBC
HCT: 42.7 % (ref 36.0–46.0)
Hemoglobin: 15.4 g/dL — ABNORMAL HIGH (ref 12.0–15.0)
MCH: 35.5 pg — ABNORMAL HIGH (ref 26.0–34.0)
MCHC: 36.1 g/dL — ABNORMAL HIGH (ref 30.0–36.0)
MCV: 98.4 fL (ref 78.0–100.0)
Platelets: 169 10*3/uL (ref 150–400)
RBC: 4.34 MIL/uL (ref 3.87–5.11)
RDW: 13.9 % (ref 11.5–15.5)
WBC: 4.3 10*3/uL (ref 4.0–10.5)

## 2013-02-15 ENCOUNTER — Telehealth (INDEPENDENT_AMBULATORY_CARE_PROVIDER_SITE_OTHER): Payer: Self-pay | Admitting: *Deleted

## 2013-02-15 ENCOUNTER — Other Ambulatory Visit (INDEPENDENT_AMBULATORY_CARE_PROVIDER_SITE_OTHER): Payer: Self-pay | Admitting: Internal Medicine

## 2013-02-15 DIAGNOSIS — K5090 Crohn's disease, unspecified, without complications: Secondary | ICD-10-CM

## 2013-02-15 MED ORDER — OMEPRAZOLE 20 MG PO CPDR: DELAYED_RELEASE_CAPSULE | ORAL | Status: DC

## 2013-02-15 NOTE — Progress Notes (Signed)
Section change from pantoprazole to omeprazole

## 2013-02-15 NOTE — Telephone Encounter (Signed)
Patient is requesting to go back to the Omeprazole. She states that the Pantoprazole is not working as well as that Omeprazole did.

## 2013-02-15 NOTE — Telephone Encounter (Signed)
Per Dr.Rehman the patient will need to have labs drawn in 3 months 

## 2013-02-16 NOTE — Telephone Encounter (Signed)
PPI changed to omeprazole

## 2013-03-08 ENCOUNTER — Encounter (INDEPENDENT_AMBULATORY_CARE_PROVIDER_SITE_OTHER): Payer: Self-pay | Admitting: *Deleted

## 2013-03-29 ENCOUNTER — Encounter (INDEPENDENT_AMBULATORY_CARE_PROVIDER_SITE_OTHER): Payer: Self-pay | Admitting: *Deleted

## 2013-03-29 ENCOUNTER — Other Ambulatory Visit (INDEPENDENT_AMBULATORY_CARE_PROVIDER_SITE_OTHER): Payer: Self-pay | Admitting: *Deleted

## 2013-03-29 DIAGNOSIS — K5090 Crohn's disease, unspecified, without complications: Secondary | ICD-10-CM

## 2013-05-28 ENCOUNTER — Ambulatory Visit (INDEPENDENT_AMBULATORY_CARE_PROVIDER_SITE_OTHER): Payer: PRIVATE HEALTH INSURANCE | Admitting: Internal Medicine

## 2013-05-31 ENCOUNTER — Ambulatory Visit (INDEPENDENT_AMBULATORY_CARE_PROVIDER_SITE_OTHER): Payer: PRIVATE HEALTH INSURANCE | Admitting: Internal Medicine

## 2013-08-16 ENCOUNTER — Ambulatory Visit (INDEPENDENT_AMBULATORY_CARE_PROVIDER_SITE_OTHER): Payer: PRIVATE HEALTH INSURANCE | Admitting: Internal Medicine

## 2013-08-16 ENCOUNTER — Encounter (INDEPENDENT_AMBULATORY_CARE_PROVIDER_SITE_OTHER): Payer: Self-pay | Admitting: Internal Medicine

## 2013-08-16 VITALS — BP 102/58 | HR 72 | Temp 98.5°F | Ht 63.0 in | Wt 116.0 lb

## 2013-08-16 DIAGNOSIS — N390 Urinary tract infection, site not specified: Secondary | ICD-10-CM | POA: Insufficient documentation

## 2013-08-16 DIAGNOSIS — K5010 Crohn's disease of large intestine without complications: Secondary | ICD-10-CM

## 2013-08-16 LAB — CBC WITH DIFFERENTIAL/PLATELET
Basophils Absolute: 0 10*3/uL (ref 0.0–0.1)
Basophils Relative: 0 % (ref 0–1)
Eosinophils Absolute: 0.1 10*3/uL (ref 0.0–0.7)
Eosinophils Relative: 2 % (ref 0–5)
HCT: 42.4 % (ref 36.0–46.0)
Hemoglobin: 15 g/dL (ref 12.0–15.0)
Lymphocytes Relative: 28 % (ref 12–46)
Lymphs Abs: 1.7 10*3/uL (ref 0.7–4.0)
MCH: 33.5 pg (ref 26.0–34.0)
MCHC: 35.4 g/dL (ref 30.0–36.0)
MCV: 94.6 fL (ref 78.0–100.0)
Monocytes Absolute: 0.6 10*3/uL (ref 0.1–1.0)
Monocytes Relative: 10 % (ref 3–12)
Neutro Abs: 3.5 10*3/uL (ref 1.7–7.7)
Neutrophils Relative %: 60 % (ref 43–77)
Platelets: 169 10*3/uL (ref 150–400)
RBC: 4.48 MIL/uL (ref 3.87–5.11)
RDW: 14.5 % (ref 11.5–15.5)
WBC: 5.9 10*3/uL (ref 4.0–10.5)

## 2013-08-16 MED ORDER — CIPROFLOXACIN HCL 500 MG PO TABS: 500.0000 mg | ORAL_TABLET | Freq: Two times a day (BID) | ORAL | Status: DC

## 2013-08-16 NOTE — Progress Notes (Signed)
Subjective:     Patient ID: [name], female   DOB: Aug 06, 1960, 53 y.o.   MRN: [id number]  HPI Here today for f/u of her Crohns. She says she is doing well.  Maintained on 6MP. Appetite is good.  She has not had a flare in several years. She has gained 2 pounds since her last visit.  She is having 3 a day. Stools are formed. No blood or mucous. Appetite is good.  No abdominal pain. Also having urinary freauency and left flank pain.   Review of Systems Past Medical History  Diagnosis Date  . Rectal bleeding 10/01/2010  . Lower abdominal pain 10/01/2010  . Diarrhea 10/01/2010  . Anorexia 10/01/2010  . Emesis 10/01/2010  . Crohn's 10/01/2010  . GERD (gastroesophageal reflux disease)   . Chronic pain syndrome   . Arthritis     Past Surgical History  Procedure Laterality Date  . Colonoscopy  01/2006    October 2007 revealinga ileal ulcers proximal to anastomosis without stricture and a single rectal ulcer.  .  partial hysterectomy  about 20 years ago  . Cholecystectomy      No Known Allergies  Current Outpatient Prescriptions on File Prior to Visit  Medication Sig Dispense Refill  . ALPRAZolam (XANAX) 0.5 MG tablet Take 0.5 mg by mouth at bedtime as needed.        . calcium-vitamin D 250-100 MG-UNIT per tablet Take 1 tablet by mouth 2 (two) times daily.  60 tablet  11  . gelatin 650 MG capsule Take by mouth daily.        . mercaptopurine (PURINETHOL) 50 MG tablet TAKE 1 AND 1/2 TABLET BY MOUTH EVERY DAY  45 tablet  5  . multivitamin (THERAGRAN) per tablet Take 1 tablet by mouth daily.        Marland Kitchen omeprazole (PRILOSEC) 20 MG capsule TAKE 1 CAPSULE BY MOUTH EVERY DAY  30 capsule  11  . oxyCODONE (OXYCONTIN) 40 MG 12 hr tablet Take 40 mg by mouth.        . pantoprazole (PROTONIX) 40 MG tablet Take 1 tablet (40 mg total) by mouth daily.  30 tablet  11   No current facility-administered medications on file prior to visit.        Objective:   Physical Exam  Filed Vitals:   08/16/13 1040  BP: 102/58  Pulse: 72  Temp: 98.5 F (36.9 C)  Height: 5' 3"  (1.6 m)  Weight: 116 lb (52.617 kg)   Alert and oriented. Skin warm and dry. Oral mucosa is moist.   . Sclera anicteric, conjunctivae is pink. Thyroid not enlarged. No cervical lymphadenopathy. Lungs clear. Heart regular rate and rhythm.  Abdomen is soft. Bowel sounds are positive. No hepatomegaly. No abdominal masses felt. No tenderness. Midline abdominal scar.  No edema to lower extremities.       Assessment:     Crohn's . She is in remission. Doing well. She has no complaints except for a UTI.     Plan:    Continue present medication. Rx for Cipro 548m BID x 3 days. OV in 6 months.

## 2013-08-16 NOTE — Patient Instructions (Signed)
Continue present medications. OV in 6 months.

## 2013-10-12 ENCOUNTER — Other Ambulatory Visit (INDEPENDENT_AMBULATORY_CARE_PROVIDER_SITE_OTHER): Payer: Self-pay | Admitting: Internal Medicine

## 2013-10-14 ENCOUNTER — Other Ambulatory Visit (INDEPENDENT_AMBULATORY_CARE_PROVIDER_SITE_OTHER): Payer: Self-pay | Admitting: Internal Medicine

## 2013-10-14 MED ORDER — MERCAPTOPURINE 50 MG PO TABS: ORAL_TABLET | ORAL | Status: DC

## 2013-10-17 HISTORY — PX: TOOTH EXTRACTION: SUR596

## 2013-10-22 ENCOUNTER — Other Ambulatory Visit (HOSPITAL_COMMUNITY): Payer: Self-pay | Admitting: Pulmonary Disease

## 2013-10-22 DIAGNOSIS — S060X0A Concussion without loss of consciousness, initial encounter: Secondary | ICD-10-CM

## 2013-10-25 ENCOUNTER — Ambulatory Visit (HOSPITAL_COMMUNITY): Payer: PRIVATE HEALTH INSURANCE

## 2013-11-01 ENCOUNTER — Encounter (INDEPENDENT_AMBULATORY_CARE_PROVIDER_SITE_OTHER): Payer: Self-pay | Admitting: Internal Medicine

## 2013-11-01 ENCOUNTER — Ambulatory Visit (INDEPENDENT_AMBULATORY_CARE_PROVIDER_SITE_OTHER): Payer: PRIVATE HEALTH INSURANCE | Admitting: Internal Medicine

## 2013-11-01 VITALS — BP 92/60 | HR 72 | Temp 98.0°F | Ht 63.0 in | Wt 112.1 lb

## 2013-11-01 DIAGNOSIS — K921 Melena: Secondary | ICD-10-CM
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
## 2013-11-01 NOTE — Patient Instructions (Signed)
CBC and CRP. Further recommendations to follow.

## 2013-11-02 LAB — CBC WITH DIFFERENTIAL/PLATELET
Basophils Absolute: 0.1 10*3/uL (ref 0.0–0.1)
Basophils Relative: 1 % (ref 0–1)
Eosinophils Absolute: 0.1 10*3/uL (ref 0.0–0.7)
Eosinophils Relative: 1 % (ref 0–5)
HCT: 37.2 % (ref 36.0–46.0)
Hemoglobin: 13.3 g/dL (ref 12.0–15.0)
Lymphocytes Relative: 31 % (ref 12–46)
Lymphs Abs: 1.7 10*3/uL (ref 0.7–4.0)
MCH: 34.5 pg — ABNORMAL HIGH (ref 26.0–34.0)
MCHC: 35.8 g/dL (ref 30.0–36.0)
MCV: 96.6 fL (ref 78.0–100.0)
Monocytes Absolute: 0.3 10*3/uL (ref 0.1–1.0)
Monocytes Relative: 6 % (ref 3–12)
Neutro Abs: 3.4 10*3/uL (ref 1.7–7.7)
Neutrophils Relative %: 61 % (ref 43–77)
Platelets: 185 10*3/uL (ref 150–400)
RBC: 3.85 MIL/uL — ABNORMAL LOW (ref 3.87–5.11)
RDW: 15.6 % — ABNORMAL HIGH (ref 11.5–15.5)
WBC: 5.5 10*3/uL (ref 4.0–10.5)

## 2013-11-02 LAB — C-REACTIVE PROTEIN: CRP: <0.5 mg/dL (ref <0.60)

## 2013-11-13 ENCOUNTER — Other Ambulatory Visit (HOSPITAL_COMMUNITY): Payer: Self-pay | Admitting: Pulmonary Disease

## 2013-11-13 DIAGNOSIS — Z1231 Encounter for screening mammogram for malignant neoplasm of breast: Secondary | ICD-10-CM

## 2013-11-15 ENCOUNTER — Telehealth (INDEPENDENT_AMBULATORY_CARE_PROVIDER_SITE_OTHER): Payer: Self-pay | Admitting: *Deleted

## 2013-11-15 NOTE — Telephone Encounter (Signed)
   Diagnosis:    Result(s)   Card 1:Negative: 11/11/13    Card 2: Positive: 11/12/13    Card 3: Positive: 11/13/13     Completed by: Thomas Hoff, LPN   HEMOCCULT SENSA DEVELOPER: LOT#:  [id number] EXPIRATION DATE: 2016-05   HEMOCCULT SENSA CARD:  MWN#:[id number]   EXPIRATION DATE: 04/16   CARD CONTROL RESULTS:  POSITIVE: Positive NEGATIVE: Negative    ADDITIONAL COMMENTS:

## 2013-11-15 NOTE — Telephone Encounter (Signed)
I discussed with Dr. Laural Golden. Needs EGD with propofol next week

## 2013-11-16 ENCOUNTER — Other Ambulatory Visit (INDEPENDENT_AMBULATORY_CARE_PROVIDER_SITE_OTHER): Payer: Self-pay | Admitting: *Deleted

## 2013-11-16 ENCOUNTER — Encounter (HOSPITAL_COMMUNITY): Payer: Self-pay | Admitting: Pharmacy Technician

## 2013-11-16 DIAGNOSIS — R195 Other fecal abnormalities: Secondary | ICD-10-CM

## 2013-11-16 NOTE — Telephone Encounter (Signed)
EGD w/ propofol sch'd 8/5 @ 730, pre-op 8/3 @ 145, patient aware

## 2013-11-19 ENCOUNTER — Encounter (HOSPITAL_COMMUNITY): Payer: Self-pay

## 2013-11-19 ENCOUNTER — Encounter (HOSPITAL_COMMUNITY)
Admission: RE | Admit: 2013-11-19 | Discharge: 2013-11-19 | Disposition: A | Discharge status: Home / Self Care | Payer: PRIVATE HEALTH INSURANCE | Source: Ambulatory Visit | Attending: Internal Medicine | Admitting: Internal Medicine

## 2013-11-19 DIAGNOSIS — R630 Anorexia: Secondary | ICD-10-CM | POA: Diagnosis not present

## 2013-11-19 DIAGNOSIS — Z8719 Personal history of other diseases of the digestive system: Secondary | ICD-10-CM | POA: Diagnosis not present

## 2013-11-19 DIAGNOSIS — Z79899 Other long term (current) drug therapy: Secondary | ICD-10-CM | POA: Diagnosis not present

## 2013-11-19 DIAGNOSIS — R1013 Epigastric pain: Secondary | ICD-10-CM | POA: Diagnosis present

## 2013-11-19 DIAGNOSIS — K921 Melena: Secondary | ICD-10-CM | POA: Diagnosis not present

## 2013-11-19 DIAGNOSIS — F17200 Nicotine dependence, unspecified, uncomplicated: Secondary | ICD-10-CM | POA: Diagnosis not present

## 2013-11-19 DIAGNOSIS — K449 Diaphragmatic hernia without obstruction or gangrene: Secondary | ICD-10-CM | POA: Diagnosis not present

## 2013-11-19 DIAGNOSIS — R634 Abnormal weight loss: Secondary | ICD-10-CM | POA: Diagnosis not present

## 2013-11-19 DIAGNOSIS — G894 Chronic pain syndrome: Secondary | ICD-10-CM | POA: Diagnosis not present

## 2013-11-19 LAB — BASIC METABOLIC PANEL
Anion gap: 14 (ref 5–15)
BUN: 9 mg/dL (ref 6–23)
CO2: 25 mEq/L (ref 19–32)
Calcium: 9.4 mg/dL (ref 8.4–10.5)
Chloride: 104 mEq/L (ref 96–112)
Creatinine, Ser: 0.75 mg/dL (ref 0.50–1.10)
GFR calc Af Amer: >90 mL/min (ref >90)
GFR calc non Af Amer: >90 mL/min (ref >90)
Glucose, Bld: 109 mg/dL — ABNORMAL HIGH (ref 70–99)
Potassium: 4.1 mEq/L (ref 3.7–5.3)
Sodium: 143 mEq/L (ref 137–147)

## 2013-11-19 LAB — HEMOGLOBIN AND HEMATOCRIT, BLOOD
HCT: 38.4 % (ref 36.0–46.0)
Hemoglobin: 14 g/dL (ref 12.0–15.0)

## 2013-11-19 NOTE — Patient Instructions (Signed)
[name]  11/19/2013   Your procedure is scheduled on:  Wednesday, 11/21/13  Report to [facility] [place]  AM.  Call this number if you have problems the morning of surgery: [phone]   Remember:   Do not eat food or drink liquids after midnight.   Take these medicines the morning of surgery with A SIP OF WATER: xanax, omeprazole, oxycodone if needed, mercaptopurine   Do not wear jewelry, make-up or nail polish.  Do not wear lotions, powders, or perfumes. You may wear deodorant.  Do not shave 48 hours prior to surgery. Men may shave face and neck.  Do not bring valuables to the hospital.  [facility] is not responsible                  for any belongings or valuables.               Contacts, dentures or bridgework may not be worn into surgery.  Leave suitcase in the car. After surgery it may be brought to your room.  For patients admitted to the hospital, discharge time is determined by your                treatment team.               Patients discharged the day of surgery will not be allowed to drive  home.  Name and phone number of your driver: family  Special Instructions: Please follow any instructions given to you at  Dr. Olevia Perches office.   Please read over the following fact sheets that you were given: Anesthesia Post-op Instructions and Care and Recovery After Surgery Esophagogastroduodenoscopy Care After Refer to this sheet in the next few weeks. These instructions provide you with information on caring for yourself after your procedure. Your caregiver may also give you more specific instructions. Your treatment has been planned according to current medical practices, but problems sometimes occur. Call your caregiver if you have any problems or questions after your procedure.  HOME CARE INSTRUCTIONS  Do not eat or drink anything until the numbing medicine (local anesthetic) has worn off and your gag reflex has returned. You will know that the local anesthetic has worn off  when you can swallow comfortably.  Do not drive for 12 hours after the procedure or as directed by your caregiver.  Only take medicines as directed by your caregiver. SEEK MEDICAL CARE IF:   You cannot stop coughing.  You are not urinating at all or less than usual. SEEK IMMEDIATE MEDICAL CARE IF:  You have difficulty swallowing.  You cannot eat or drink.  You have worsening throat or chest pain.  You have dizziness, lightheadedness, or you faint.  You have nausea or vomiting.  You have chills.  You have a fever.  You have severe abdominal pain.  You have black, tarry, or bloody stools. Document Released: 03/22/2012 Document Reviewed: 03/22/2012 [facility] Patient Information 2015 [place]. This information is not intended to replace advice given to you by your health care provider. Make sure you discuss any questions you have with your health care provider. Esophagogastroduodenoscopy Esophagogastroduodenoscopy (EGD) is a procedure to examine the lining of the esophagus, stomach, and first part of the small intestine (duodenum). A long, flexible, lighted tube with a camera attached (endoscope) is inserted down the throat to view these organs. This procedure is done to detect problems or abnormalities, such as inflammation, bleeding, ulcers, or growths, in order to treat them.  The procedure lasts about 5-20 minutes. It is usually an outpatient procedure, but it may need to be performed in emergency cases in the hospital. LET YOUR CAREGIVER KNOW ABOUT:   Allergies to food or medicine.  All medicines you are taking, including vitamins, herbs, eyedrops, and over-the-counter medicines and creams.  Use of steroids (by mouth or creams).  Previous problems you or members of your family have had with the use of anesthetics.  Any blood disorders you have.  Previous surgeries you have had.  Other health problems you have.  Possibility of pregnancy, if this applies. RISKS  AND COMPLICATIONS  Generally, EGD is a safe procedure. However, as with any procedure, complications can occur. Possible complications include:  Infection.  Bleeding.  Tearing (perforation) of the esophagus, stomach, or duodenum.  Difficulty breathing or not being able to breath.  Excessive sweating.  Spasms of the larynx.  Slowed heartbeat.  Low blood pressure. BEFORE THE PROCEDURE  Do not eat or drink anything for 6-8 hours before the procedure or as directed by your caregiver.  Ask your caregiver about changing or stopping your regular medicines.  If you wear dentures, be prepared to remove them before the procedure.  Arrange for someone to drive you home after the procedure. PROCEDURE   A vein will be accessed to give medicines and fluids. A medicine to relax you (sedative) and a pain reliever will be given through that access into the vein.  A numbing medicine (local anesthetic) may be sprayed on your throat for comfort and to stop you from gagging or coughing.  A mouth guard may be placed in your mouth to protect your teeth and to keep you from biting on the endoscope.  You will be asked to lie on your left side.  The endoscope is inserted down your throat and into the esophagus, stomach, and duodenum.  Air is put through the endoscope to allow your caregiver to view the lining of your esophagus clearly.  The esophagus, stomach, and duodenum is then examined. During the exam, your caregiver may:  Remove tissue to be examined under a microscope (biopsy) for inflammation, infection, or other medical problems.  Remove growths.  Remove objects (foreign bodies) that are stuck.  Treat any bleeding with medicines or other devices that stop tissues from bleeding (hot cautery, clipping devices).  Widen (dilate) or stretch narrowed areas of the esophagus and stomach.  The endoscope will then be withdrawn. AFTER THE PROCEDURE  You will be taken to a recovery area to  be monitored. You will be able to go home once you are stable and alert.  Do not eat or drink anything until the local anesthetic and numbing medicines have worn off. You may choke.  It is normal to feel bloated, have pain with swallowing, or have a sore throat for a short time. This will wear off.  Your caregiver should be able to discuss his or her findings with you. It will take longer to discuss the test results if any biopsies were taken. Document Released: 08/06/2004 Document Revised: 08/20/2013 Document Reviewed: 03/08/2012 [facility] Patient Information 2015 [place], [place]. This information is not intended to replace advice given to you by your health care provider. Make sure you discuss any questions you have with your health care provider.

## 2013-11-21 ENCOUNTER — Encounter (HOSPITAL_COMMUNITY): Payer: PRIVATE HEALTH INSURANCE | Admitting: Anesthesiology

## 2013-11-21 ENCOUNTER — Encounter (HOSPITAL_COMMUNITY)
Admission: RE | Disposition: A | Discharge status: Home / Self Care | Payer: Self-pay | Source: Ambulatory Visit | Attending: Internal Medicine

## 2013-11-21 ENCOUNTER — Encounter (HOSPITAL_COMMUNITY): Payer: Self-pay

## 2013-11-21 ENCOUNTER — Ambulatory Visit (HOSPITAL_COMMUNITY): Payer: PRIVATE HEALTH INSURANCE | Admitting: Anesthesiology

## 2013-11-21 ENCOUNTER — Ambulatory Visit (HOSPITAL_COMMUNITY)
Admission: RE | Admit: 2013-11-21 | Discharge: 2013-11-21 | Disposition: A | Discharge status: Home / Self Care | Payer: PRIVATE HEALTH INSURANCE | Source: Ambulatory Visit | Attending: Internal Medicine | Admitting: Internal Medicine

## 2013-11-21 DIAGNOSIS — K449 Diaphragmatic hernia without obstruction or gangrene: Secondary | ICD-10-CM | POA: Diagnosis not present

## 2013-11-21 DIAGNOSIS — Z8719 Personal history of other diseases of the digestive system: Secondary | ICD-10-CM | POA: Insufficient documentation

## 2013-11-21 DIAGNOSIS — K2970 Gastritis, unspecified, without bleeding: Secondary | ICD-10-CM

## 2013-11-21 DIAGNOSIS — R1013 Epigastric pain: Secondary | ICD-10-CM | POA: Insufficient documentation

## 2013-11-21 DIAGNOSIS — K2990 Gastroduodenitis, unspecified, without bleeding: Secondary | ICD-10-CM

## 2013-11-21 DIAGNOSIS — R195 Other fecal abnormalities: Secondary | ICD-10-CM

## 2013-11-21 DIAGNOSIS — R634 Abnormal weight loss: Secondary | ICD-10-CM

## 2013-11-21 DIAGNOSIS — Z79899 Other long term (current) drug therapy: Secondary | ICD-10-CM | POA: Insufficient documentation

## 2013-11-21 DIAGNOSIS — R630 Anorexia: Secondary | ICD-10-CM | POA: Insufficient documentation

## 2013-11-21 DIAGNOSIS — G894 Chronic pain syndrome: Secondary | ICD-10-CM | POA: Insufficient documentation

## 2013-11-21 DIAGNOSIS — K921 Melena: Secondary | ICD-10-CM | POA: Insufficient documentation

## 2013-11-21 DIAGNOSIS — F17200 Nicotine dependence, unspecified, uncomplicated: Secondary | ICD-10-CM | POA: Insufficient documentation

## 2013-11-21 HISTORY — PX: ESOPHAGOGASTRODUODENOSCOPY (EGD) WITH PROPOFOL: SHX5813

## 2013-11-21 SURGERY — ESOPHAGOGASTRODUODENOSCOPY (EGD) WITH PROPOFOL
Anesthesia: Monitor Anesthesia Care | Site: Esophagus

## 2013-11-21 MED ORDER — PROPOFOL 10 MG/ML IV BOLUS
INTRAVENOUS | Status: AC
  Filled 2013-11-21: qty 20

## 2013-11-21 MED ORDER — MIDAZOLAM HCL 2 MG/2ML IJ SOLN
INTRAMUSCULAR | Status: AC
  Filled 2013-11-21: qty 2

## 2013-11-21 MED ORDER — STERILE WATER FOR IRRIGATION IR SOLN
Status: DC | PRN
  Administered 2013-11-21: 08:00:00

## 2013-11-21 MED ORDER — FENTANYL CITRATE 0.05 MG/ML IJ SOLN: 25.0000 ug | INTRAMUSCULAR | Status: DC | PRN

## 2013-11-21 MED ORDER — PROPOFOL INFUSION 10 MG/ML OPTIME
INTRAVENOUS | Status: DC | PRN
  Administered 2013-11-21: 125 ug/kg/min via INTRAVENOUS

## 2013-11-21 MED ORDER — ONDANSETRON HCL 4 MG/2ML IJ SOLN
INTRAMUSCULAR | Status: AC
  Filled 2013-11-21: qty 2

## 2013-11-21 MED ORDER — ONDANSETRON HCL 4 MG/2ML IJ SOLN
4.0000 mg | Freq: Once | INTRAMUSCULAR | Status: AC
  Administered 2013-11-21: 4 mg via INTRAVENOUS

## 2013-11-21 MED ORDER — FENTANYL CITRATE 0.05 MG/ML IJ SOLN
25.0000 ug | INTRAMUSCULAR | Status: AC
  Administered 2013-11-21 (×2): 25 ug via INTRAVENOUS

## 2013-11-21 MED ORDER — FENTANYL CITRATE 0.05 MG/ML IJ SOLN
INTRAMUSCULAR | Status: AC
  Filled 2013-11-21: qty 2

## 2013-11-21 MED ORDER — BUTAMBEN-TETRACAINE-BENZOCAINE 2-2-14 % EX AERO
2.0000 | INHALATION_SPRAY | Freq: Once | CUTANEOUS | Status: AC
  Administered 2013-11-21: 2 via TOPICAL
  Filled 2013-11-21: qty 56

## 2013-11-21 MED ORDER — LACTATED RINGERS IV SOLN
INTRAVENOUS | Status: DC
  Administered 2013-11-21: 07:00:00 via INTRAVENOUS

## 2013-11-21 MED ORDER — LIDOCAINE HCL (PF) 1 % IJ SOLN
INTRAMUSCULAR | Status: AC
  Filled 2013-11-21: qty 5

## 2013-11-21 MED ORDER — MIDAZOLAM HCL 2 MG/2ML IJ SOLN
1.0000 mg | INTRAMUSCULAR | Status: DC | PRN
  Administered 2013-11-21: 2 mg via INTRAVENOUS

## 2013-11-21 MED ORDER — ONDANSETRON HCL 4 MG/2ML IJ SOLN: 4.0000 mg | Freq: Once | INTRAMUSCULAR | Status: DC | PRN

## 2013-11-21 SURGICAL SUPPLY — 21 items
BLOCK BITE 60FR ADLT L/F BLUE (MISCELLANEOUS) ×1 IMPLANT
ELECT REM PT RETURN 9FT ADLT (ELECTROSURGICAL)
ELECTRODE REM PT RTRN 9FT ADLT (ELECTROSURGICAL) IMPLANT
FLOOR PAD 36X40 (MISCELLANEOUS) ×2
FORCEP COLD BIOPSY (CUTTING FORCEPS) IMPLANT
FORCEPS BIOP RAD 4 LRG CAP 4 (CUTTING FORCEPS) IMPLANT
FORMALIN 10 PREFIL 20ML (MISCELLANEOUS) IMPLANT
KIT CLEAN ENDO COMPLIANCE (KITS) ×1 IMPLANT
MANIFOLD NEPTUNE II (INSTRUMENTS) ×2 IMPLANT
NDL SCLEROTHERAPY 25GX240 (NEEDLE) IMPLANT
NEEDLE SCLEROTHERAPY 25GX240 (NEEDLE) IMPLANT
PAD FLOOR 36X40 (MISCELLANEOUS) IMPLANT
PROBE APC STR FIRE (PROBE) IMPLANT
PROBE INJECTION GOLD (MISCELLANEOUS)
PROBE INJECTION GOLD 7FR (MISCELLANEOUS) IMPLANT
SNARE ROTATE MED OVAL 20MM (MISCELLANEOUS) IMPLANT
SNARE SHORT THROW 13M SML OVAL (MISCELLANEOUS) ×2 IMPLANT
SYR 50ML LL SCALE MARK (SYRINGE) ×1 IMPLANT
SYR INFLATION 60ML (SYRINGE) ×1 IMPLANT
TUBING IRRIGATION ENDOGATOR (MISCELLANEOUS) ×1 IMPLANT
WATER STERILE IRR 1000ML POUR (IV SOLUTION) ×1 IMPLANT

## 2013-11-21 NOTE — Op Note (Signed)
EGD PROCEDURE REPORT  PATIENT:  [name]  MR#:  [id number] Birthdate:  [birth year], 53 y.o., female Endoscopist:  Dr. Rogene Houston, MD Referred By:  Dr. Jasper Loser. Luan Pulling, MD  Procedure Date: 11/21/2013  Procedure:   EGD  Indications:  Patient is a 53 year old Caucasian female with history of small bowel Crohn's disease who presents with 2 week history of melena epigastric pain she also lost about 8 pounds. Patient states her pain medication was ordered and she took some BC powder. H&H was normal. Melena has cleared. Heartburn is well controlled with PPI.            Informed Consent:  The risks, benefits, alternatives & imponderables which include, but are not limited to, bleeding, infection, perforation, drug reaction and potential missed lesion have been reviewed.  The potential for biopsy, lesion removal, esophageal dilation, etc. have also been discussed.  Questions have been answered.  All parties agreeable.  Please see history & physical in medical record for more information.  Medications:  Cetacaine spray topically for oropharyngeal anesthesia Monitored anesthesia care. Please see anesthesia records for details.  Description of procedure:  The endoscope was introduced through the mouth and advanced to the second portion of the duodenum without difficulty or limitations. The mucosal surfaces were surveyed very carefully during advancement of the scope and upon withdrawal.  Findings:  Esophagus:  Mucosa of the esophagus was normal. GE junction was unremarkable. GEJ:  37 cm Hiatus:  39 cm Stomach:  Stomach was empty and distended very well with insufflation. Folds in the proximal stomach were normal. Examination of mucosa at gastric body and antrum was normal. Mucosa and pyloric channel was erythematous and friable. Bruising noted on passing the scope across it. Angularis fundus and cardia were unremarkable. Duodenum:  Normal bulbar and post bulbar mucosa.  Therapeutic/Diagnostic  Maneuvers Performed:  None  Complications:  None  Impression: Small sliding hiatal hernia but no changes of reflux esophagitis. Pyloric channel inflammation but no evidence of peptic ulcer disease or gastroduodenal Crohn's disease.  Recommendations:  H. pylori serology. Patient advised not to use OTC NSAIDs. If melena recurs will consider small bowel evaluation.  Jeremiah Tarpley U  11/21/2013  7:57 AM  CC: Dr. Alonza Bogus, MD & Dr. Rayne Du ref. provider found

## 2013-11-21 NOTE — Anesthesia Preprocedure Evaluation (Signed)
Anesthesia Evaluation  Patient identified by MRN, date of birth, ID band Patient awake    Reviewed: Allergy & Precautions, H&P , NPO status , Patient's Chart, lab work & pertinent test results  Airway Mallampati: II TM Distance: >3 FB Neck ROM: Full    Dental  (+) Partial Upper, Teeth Intact   Pulmonary Current Smoker,  breath sounds clear to auscultation- rhonchi        Cardiovascular negative cardio ROS  Rhythm:Regular Rate:Normal     Neuro/Psych PSYCHIATRIC DISORDERS (chronic pain syndrome)  Neuromuscular disease    GI/Hepatic GERD-  Medicated and Controlled,  Endo/Other    Renal/GU      Musculoskeletal   Abdominal   Peds  Hematology   Anesthesia Other Findings   Reproductive/Obstetrics                           Anesthesia Physical Anesthesia Plan  ASA: II  Anesthesia Plan: MAC   Post-op Pain Management:    Induction: Intravenous  Airway Management Planned: Simple Face Mask  Additional Equipment:   Intra-op Plan:   Post-operative Plan:   Informed Consent: I have reviewed the patients History and Physical, chart, labs and discussed the procedure including the risks, benefits and alternatives for the proposed anesthesia with the patient or authorized representative who has indicated his/her understanding and acceptance.     Plan Discussed with:   Anesthesia Plan Comments:         Anesthesia Quick Evaluation

## 2013-11-21 NOTE — Discharge Instructions (Signed)
Resume usual medications and diet. No aspirin or NSAIDs. No driving for 24 hours. Call office if stools turn black again.   Esophagogastroduodenoscopy Care After Refer to this sheet in the next few weeks. These instructions provide you with information on caring for yourself after your procedure. Your caregiver may also give you more specific instructions. Your treatment has been planned according to current medical practices, but problems sometimes occur. Call your caregiver if you have any problems or questions after your procedure.  HOME CARE INSTRUCTIONS  Do not eat or drink anything until the numbing medicine (local anesthetic) has worn off and your gag reflex has returned. You will know that the local anesthetic has worn off when you can swallow comfortably.  Do not drive for 12 hours after the procedure or as directed by your caregiver.  Only take medicines as directed by your caregiver. SEEK MEDICAL CARE IF:   You cannot stop coughing.  You are not urinating at all or less than usual. SEEK IMMEDIATE MEDICAL CARE IF:  You have difficulty swallowing.  You cannot eat or drink.  You have worsening throat or chest pain.  You have dizziness, lightheadedness, or you faint.  You have nausea or vomiting.  You have chills.  You have a fever.  You have severe abdominal pain.  You have black, tarry, or bloody stools. Document Released: 03/22/2012 Document Reviewed: 03/22/2012 [facility] Patient Information 2015 [place]. This information is not intended to replace advice given to you by your health care provider. Make sure you discuss any questions you have with your health care provider.

## 2013-11-21 NOTE — Transfer of Care (Signed)
Immediate Anesthesia Transfer of Care Note  Patient: [name]  Procedure(s) Performed: Procedure(s): ESOPHAGOGASTRODUODENOSCOPY (EGD) WITH PROPOFOL (N/A)  Patient Location: PACU  Anesthesia Type:MAC  Level of Consciousness: awake, alert  and oriented  Airway & Oxygen Therapy: Patient Spontanous Breathing  Post-op Assessment: Report given to PACU RN  Post vital signs: Reviewed and stable  Complications: No apparent anesthesia complications

## 2013-11-21 NOTE — Anesthesia Postprocedure Evaluation (Signed)
  Anesthesia Post-op Note  Patient: [name]  Procedure(s) Performed: Procedure(s): ESOPHAGOGASTRODUODENOSCOPY (EGD) WITH PROPOFOL (N/A)  Patient Location: PACU  Anesthesia Type:MAC  Level of Consciousness: awake, alert  and oriented  Airway and Oxygen Therapy: Patient Spontanous Breathing  Post-op Pain: none  Post-op Assessment: Post-op Vital signs reviewed, Patient's Cardiovascular Status Stable, Respiratory Function Stable, Patent Airway and No signs of Nausea or vomiting  Post-op Vital Signs: Reviewed and stable  Last Vitals:  Filed Vitals:   11/21/13 0725  BP: 139/89  Pulse:   Temp:   Resp: 16    Complications: No apparent anesthesia complications
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
## 2013-11-22 ENCOUNTER — Ambulatory Visit (HOSPITAL_COMMUNITY): Payer: PRIVATE HEALTH INSURANCE

## 2013-11-22 ENCOUNTER — Encounter (HOSPITAL_COMMUNITY): Payer: Self-pay | Admitting: Internal Medicine

## 2013-11-22 LAB — H. PYLORI ANTIBODY, IGG: H Pylori IgG: 0.93 {ISR} — ABNORMAL HIGH

## 2013-11-28 ENCOUNTER — Ambulatory Visit (INDEPENDENT_AMBULATORY_CARE_PROVIDER_SITE_OTHER): Payer: PRIVATE HEALTH INSURANCE | Admitting: Adult Health

## 2013-11-28 ENCOUNTER — Encounter: Payer: Self-pay | Admitting: Adult Health

## 2013-11-28 ENCOUNTER — Other Ambulatory Visit (INDEPENDENT_AMBULATORY_CARE_PROVIDER_SITE_OTHER): Payer: Self-pay | Admitting: Internal Medicine

## 2013-11-28 VITALS — BP 120/72 | HR 74 | Ht 61.5 in | Wt 113.0 lb

## 2013-11-28 DIAGNOSIS — Z1212 Encounter for screening for malignant neoplasm of rectum: Secondary | ICD-10-CM

## 2013-11-28 DIAGNOSIS — Z01419 Encounter for gynecological examination (general) (routine) without abnormal findings: Secondary | ICD-10-CM

## 2013-11-28 LAB — HEMOCCULT GUIAC POC 1CARD (OFFICE): Fecal Occult Blood, POC: NEGATIVE

## 2013-11-28 MED ORDER — BIS SUBCIT-METRONID-TETRACYC 140-125-125 MG PO CAPS: 3.0000 | ORAL_CAPSULE | Freq: Three times a day (TID) | ORAL | Status: DC

## 2013-11-28 NOTE — Progress Notes (Signed)
Patient ID: [name], female   DOB: Nov 26, 1960, 53 y.o.   MRN: [id number] History of Present Illness: [name] is a 53 year old white female, single in for a physical.   Current Medications, Allergies, Past Medical History, Past Surgical History, Family History and Social History were reviewed in [facility] record.     Review of Systems: Patient denies any daily headaches, blurred vision, shortness of breath, chest pain, abdominal pain, problems with urination, or intercourse. No joint pain or mood swings, has some hot flashes, but deals with them. She is not having sex, she has crohn's and has had some bleeding has had EGD and will be having colonoscopy soon with Dr Laural Golden.She has history of a fistula.    Physical Exam: General:  Well developed, well nourished, no acute distress Skin:  Warm and dry,tan Neck:  Midline trachea, normal thyroid Lungs; Clear to auscultation bilaterally Breast:  No dominant palpable mass, retraction, or nipple discharge Cardiovascular: Regular rate and rhythm Abdomen:  Soft, non tender, no hepatosplenomegaly Pelvic:  External genitalia is normal in appearance for age, no lesions.  The vagina is pale pink with fairly good moisture but loss of rugae,the cervix and uterus are absent. No  adnexal masses or tenderness noted. Rectal: Good sphincter tone, no polypsinternal hemorrhoids felt.  Hemoccult negative. Extremities:  No swelling or varicosities noted Psych:  No mood changes, alert and cooperative,seems happy   Impression: Yearly gyn exam no pap    Plan: Physical in 1 year Mammogram yearly  Labs with PCP Colonoscopy per Dr Laural Golden Use astroglide if has sex

## 2013-11-28 NOTE — Patient Instructions (Signed)
Physical in 1 year Mammogram yearly Colonoscopy with Dr Laural Golden Labs with Dr Laural Golden

## 2013-11-30 ENCOUNTER — Ambulatory Visit (HOSPITAL_COMMUNITY)
Admission: RE | Admit: 2013-11-30 | Discharge: 2013-11-30 | Disposition: A | Discharge status: Home / Self Care | Payer: PRIVATE HEALTH INSURANCE | Source: Ambulatory Visit | Attending: Pulmonary Disease | Admitting: Pulmonary Disease

## 2013-11-30 DIAGNOSIS — Z1231 Encounter for screening mammogram for malignant neoplasm of breast: Secondary | ICD-10-CM | POA: Insufficient documentation

## 2013-12-28 ENCOUNTER — Other Ambulatory Visit (HOSPITAL_COMMUNITY): Payer: Self-pay | Admitting: Pulmonary Disease

## 2013-12-28 DIAGNOSIS — R29890 Loss of height: Secondary | ICD-10-CM

## 2014-01-03 ENCOUNTER — Ambulatory Visit (HOSPITAL_COMMUNITY)
Admission: RE | Admit: 2014-01-03 | Discharge: 2014-01-03 | Disposition: A | Discharge status: Home / Self Care | Payer: PRIVATE HEALTH INSURANCE | Source: Ambulatory Visit | Attending: Pulmonary Disease | Admitting: Pulmonary Disease

## 2014-01-03 DIAGNOSIS — R29890 Loss of height: Secondary | ICD-10-CM | POA: Insufficient documentation

## 2014-01-31 ENCOUNTER — Other Ambulatory Visit (INDEPENDENT_AMBULATORY_CARE_PROVIDER_SITE_OTHER): Payer: Self-pay | Admitting: Internal Medicine

## 2014-02-18 ENCOUNTER — Encounter: Payer: Self-pay | Admitting: Adult Health

## 2014-05-04 ENCOUNTER — Other Ambulatory Visit (INDEPENDENT_AMBULATORY_CARE_PROVIDER_SITE_OTHER): Payer: Self-pay | Admitting: Internal Medicine

## 2014-05-06 ENCOUNTER — Telehealth (INDEPENDENT_AMBULATORY_CARE_PROVIDER_SITE_OTHER): Payer: Self-pay | Admitting: *Deleted

## 2014-05-06 DIAGNOSIS — K5090 Crohn's disease, unspecified, without complications: Secondary | ICD-10-CM

## 2014-05-06 NOTE — Telephone Encounter (Signed)
Per Dr.Rehman the patient will need to have labs drawn now. The medication that the patient is taking she will need to have a CBC every 3 months.

## 2014-05-20 DIAGNOSIS — K5090 Crohn's disease, unspecified, without complications: Secondary | ICD-10-CM | POA: Diagnosis not present

## 2014-05-21 LAB — CBC
HCT: 39 % (ref 36.0–46.0)
Hemoglobin: 13.5 g/dL (ref 12.0–15.0)
MCH: 34.2 pg — ABNORMAL HIGH (ref 26.0–34.0)
MCHC: 34.6 g/dL (ref 30.0–36.0)
MCV: 98.7 fL (ref 78.0–100.0)
MPV: 11.9 fL (ref 8.6–12.4)
Platelets: 159 10*3/uL (ref 150–400)
RBC: 3.95 MIL/uL (ref 3.87–5.11)
RDW: 14.3 % (ref 11.5–15.5)
WBC: 4 10*3/uL (ref 4.0–10.5)

## 2014-05-22 ENCOUNTER — Telehealth (INDEPENDENT_AMBULATORY_CARE_PROVIDER_SITE_OTHER): Payer: Self-pay | Admitting: *Deleted

## 2014-05-22 DIAGNOSIS — K5090 Crohn's disease, unspecified, without complications: Secondary | ICD-10-CM

## 2014-05-22 NOTE — Telephone Encounter (Signed)
Per Dr.Rehman the patient will need to have labs drawn in 4 months.

## 2014-05-23 DIAGNOSIS — M545 Low back pain: Secondary | ICD-10-CM | POA: Diagnosis not present

## 2014-05-23 DIAGNOSIS — F419 Anxiety disorder, unspecified: Secondary | ICD-10-CM | POA: Diagnosis not present

## 2014-05-23 DIAGNOSIS — J449 Chronic obstructive pulmonary disease, unspecified: Secondary | ICD-10-CM | POA: Diagnosis not present

## 2014-05-23 DIAGNOSIS — F17200 Nicotine dependence, unspecified, uncomplicated: Secondary | ICD-10-CM | POA: Diagnosis not present

## 2014-05-23 DIAGNOSIS — K5090 Crohn's disease, unspecified, without complications: Secondary | ICD-10-CM | POA: Diagnosis not present

## 2014-07-03 DIAGNOSIS — F419 Anxiety disorder, unspecified: Secondary | ICD-10-CM | POA: Diagnosis not present

## 2014-07-03 DIAGNOSIS — M545 Low back pain: Secondary | ICD-10-CM | POA: Diagnosis not present

## 2014-07-03 DIAGNOSIS — J449 Chronic obstructive pulmonary disease, unspecified: Secondary | ICD-10-CM | POA: Diagnosis not present

## 2014-07-03 DIAGNOSIS — K5090 Crohn's disease, unspecified, without complications: Secondary | ICD-10-CM | POA: Diagnosis not present

## 2014-07-27 ENCOUNTER — Other Ambulatory Visit (INDEPENDENT_AMBULATORY_CARE_PROVIDER_SITE_OTHER): Payer: Self-pay | Admitting: Internal Medicine

## 2014-09-11 ENCOUNTER — Encounter (INDEPENDENT_AMBULATORY_CARE_PROVIDER_SITE_OTHER): Payer: Self-pay | Admitting: *Deleted

## 2014-09-11 ENCOUNTER — Other Ambulatory Visit (INDEPENDENT_AMBULATORY_CARE_PROVIDER_SITE_OTHER): Payer: Self-pay | Admitting: *Deleted

## 2014-09-11 DIAGNOSIS — K5090 Crohn's disease, unspecified, without complications: Secondary | ICD-10-CM

## 2014-10-11 ENCOUNTER — Other Ambulatory Visit (INDEPENDENT_AMBULATORY_CARE_PROVIDER_SITE_OTHER): Payer: Self-pay | Admitting: *Deleted

## 2014-10-11 ENCOUNTER — Telehealth (INDEPENDENT_AMBULATORY_CARE_PROVIDER_SITE_OTHER): Payer: Self-pay | Admitting: *Deleted

## 2014-10-11 DIAGNOSIS — K5090 Crohn's disease, unspecified, without complications: Secondary | ICD-10-CM | POA: Diagnosis not present

## 2014-10-11 MED ORDER — MERCAPTOPURINE 50 MG PO TABS: 50.0000 mg | ORAL_TABLET | Freq: Every day | ORAL | Status: DC

## 2014-10-11 NOTE — Telephone Encounter (Signed)
Patient was taking 75 mg of the Mercaptopurine daily.  She states that she had shared with Terri , she thought that this constipated her very bad. She was decreased down to 50 mg and this has worked perfect. Patient would like to continue with the 50 mg daily. She plans to come and have her lab work done today,10/11/14. Per Dr.Rehman we leave patient on the 50 mg daily ,then. [facility] was made aware.

## 2014-10-11 NOTE — Telephone Encounter (Signed)
Per Dr.Rehman may keep the Mercaptopurine at 50 mg daily,as the patient has states that taking the 75 mg causes painful constipation.

## 2014-10-12 LAB — CBC WITH DIFFERENTIAL/PLATELET
Basophils Absolute: 0 10*3/uL (ref 0.0–0.1)
Basophils Relative: 0 % (ref 0–1)
Eosinophils Absolute: 0.1 10*3/uL (ref 0.0–0.7)
Eosinophils Relative: 2 % (ref 0–5)
HCT: 38.7 % (ref 36.0–46.0)
Hemoglobin: 13.4 g/dL (ref 12.0–15.0)
Lymphocytes Relative: 32 % (ref 12–46)
Lymphs Abs: 1.7 10*3/uL (ref 0.7–4.0)
MCH: 33.7 pg (ref 26.0–34.0)
MCHC: 34.6 g/dL (ref 30.0–36.0)
MCV: 97.2 fL (ref 78.0–100.0)
MPV: 11.9 fL (ref 8.6–12.4)
Monocytes Absolute: 0.5 10*3/uL (ref 0.1–1.0)
Monocytes Relative: 9 % (ref 3–12)
Neutro Abs: 3 10*3/uL (ref 1.7–7.7)
Neutrophils Relative %: 57 % (ref 43–77)
Platelets: 163 10*3/uL (ref 150–400)
RBC: 3.98 MIL/uL (ref 3.87–5.11)
RDW: 13.8 % (ref 11.5–15.5)
WBC: 5.3 10*3/uL (ref 4.0–10.5)

## 2014-10-14 ENCOUNTER — Telehealth (INDEPENDENT_AMBULATORY_CARE_PROVIDER_SITE_OTHER): Payer: Self-pay | Admitting: *Deleted

## 2014-10-14 DIAGNOSIS — K5010 Crohn's disease of large intestine without complications: Secondary | ICD-10-CM

## 2014-10-14 NOTE — Telephone Encounter (Signed)
Per Dr.Rehman the patient will need to have labs drawn in 4 months.

## 2014-12-24 DIAGNOSIS — K5090 Crohn's disease, unspecified, without complications: Secondary | ICD-10-CM | POA: Diagnosis not present

## 2014-12-24 DIAGNOSIS — J449 Chronic obstructive pulmonary disease, unspecified: Secondary | ICD-10-CM | POA: Diagnosis not present

## 2014-12-24 DIAGNOSIS — F17200 Nicotine dependence, unspecified, uncomplicated: Secondary | ICD-10-CM | POA: Diagnosis not present

## 2014-12-24 DIAGNOSIS — E785 Hyperlipidemia, unspecified: Secondary | ICD-10-CM | POA: Diagnosis not present

## 2014-12-24 DIAGNOSIS — F419 Anxiety disorder, unspecified: Secondary | ICD-10-CM | POA: Diagnosis not present

## 2014-12-24 DIAGNOSIS — Z0000 Encounter for general adult medical examination without abnormal findings: Secondary | ICD-10-CM | POA: Diagnosis not present

## 2014-12-24 DIAGNOSIS — M545 Low back pain: Secondary | ICD-10-CM | POA: Diagnosis not present

## 2015-01-07 ENCOUNTER — Encounter (INDEPENDENT_AMBULATORY_CARE_PROVIDER_SITE_OTHER): Payer: Self-pay | Admitting: *Deleted

## 2015-01-07 ENCOUNTER — Other Ambulatory Visit (INDEPENDENT_AMBULATORY_CARE_PROVIDER_SITE_OTHER): Payer: Self-pay | Admitting: *Deleted

## 2015-01-07 DIAGNOSIS — K5010 Crohn's disease of large intestine without complications: Secondary | ICD-10-CM

## 2015-01-13 ENCOUNTER — Other Ambulatory Visit (HOSPITAL_COMMUNITY): Payer: Self-pay | Admitting: Pulmonary Disease

## 2015-01-13 DIAGNOSIS — R7989 Other specified abnormal findings of blood chemistry: Secondary | ICD-10-CM

## 2015-01-13 DIAGNOSIS — R945 Abnormal results of liver function studies: Secondary | ICD-10-CM

## 2015-01-15 ENCOUNTER — Ambulatory Visit (HOSPITAL_COMMUNITY): Admission: RE | Admit: 2015-01-15 | Payer: Medicare Other | Source: Ambulatory Visit

## 2015-01-16 ENCOUNTER — Other Ambulatory Visit (INDEPENDENT_AMBULATORY_CARE_PROVIDER_SITE_OTHER): Payer: Self-pay | Admitting: Internal Medicine

## 2015-01-17 DIAGNOSIS — Z79891 Long term (current) use of opiate analgesic: Secondary | ICD-10-CM | POA: Diagnosis not present

## 2015-01-17 DIAGNOSIS — K5010 Crohn's disease of large intestine without complications: Secondary | ICD-10-CM | POA: Diagnosis not present

## 2015-01-18 LAB — CBC WITH DIFFERENTIAL/PLATELET
Basophils Absolute: 0 10*3/uL (ref 0.0–0.1)
Basophils Relative: 0 % (ref 0–1)
Eosinophils Absolute: 0 10*3/uL (ref 0.0–0.7)
Eosinophils Relative: 0 % (ref 0–5)
HCT: 39.7 % (ref 36.0–46.0)
Hemoglobin: 14.2 g/dL (ref 12.0–15.0)
Lymphocytes Relative: 29 % (ref 12–46)
Lymphs Abs: 1.7 10*3/uL (ref 0.7–4.0)
MCH: 35 pg — ABNORMAL HIGH (ref 26.0–34.0)
MCHC: 35.8 g/dL (ref 30.0–36.0)
MCV: 97.8 fL (ref 78.0–100.0)
MPV: 11.9 fL (ref 8.6–12.4)
Monocytes Absolute: 0.3 10*3/uL (ref 0.1–1.0)
Monocytes Relative: 6 % (ref 3–12)
Neutro Abs: 3.8 10*3/uL (ref 1.7–7.7)
Neutrophils Relative %: 65 % (ref 43–77)
Platelets: 170 10*3/uL (ref 150–400)
RBC: 4.06 MIL/uL (ref 3.87–5.11)
RDW: 14 % (ref 11.5–15.5)
WBC: 5.8 10*3/uL (ref 4.0–10.5)

## 2015-01-22 ENCOUNTER — Ambulatory Visit (HOSPITAL_COMMUNITY)
Admission: RE | Admit: 2015-01-22 | Discharge: 2015-01-22 | Disposition: A | Discharge status: Home / Self Care | Payer: Medicare Other | Source: Ambulatory Visit | Attending: Pulmonary Disease | Admitting: Pulmonary Disease

## 2015-01-22 DIAGNOSIS — R945 Abnormal results of liver function studies: Secondary | ICD-10-CM

## 2015-01-22 DIAGNOSIS — Z9049 Acquired absence of other specified parts of digestive tract: Secondary | ICD-10-CM | POA: Insufficient documentation

## 2015-01-22 DIAGNOSIS — K838 Other specified diseases of biliary tract: Secondary | ICD-10-CM | POA: Diagnosis not present

## 2015-01-22 DIAGNOSIS — R7989 Other specified abnormal findings of blood chemistry: Secondary | ICD-10-CM | POA: Diagnosis not present

## 2015-02-11 ENCOUNTER — Telehealth (INDEPENDENT_AMBULATORY_CARE_PROVIDER_SITE_OTHER): Payer: Self-pay | Admitting: Internal Medicine

## 2015-02-11 DIAGNOSIS — F17200 Nicotine dependence, unspecified, uncomplicated: Secondary | ICD-10-CM

## 2015-02-11 MED ORDER — NICOTINE 21 MG/24HR TD PT24: 21.0000 mg | MEDICATED_PATCH | Freq: Every day | TRANSDERMAL | Status: DC

## 2015-02-11 NOTE — Telephone Encounter (Signed)
Rx for Nicoderm sent to her pharmacy.

## 2015-02-12 ENCOUNTER — Telehealth (INDEPENDENT_AMBULATORY_CARE_PROVIDER_SITE_OTHER): Payer: Self-pay | Admitting: *Deleted

## 2015-02-12 NOTE — Telephone Encounter (Signed)
Needs to talk with Karna Christmas if she would please return her call at [phone].

## 2015-02-13 DIAGNOSIS — H40033 Anatomical narrow angle, bilateral: Secondary | ICD-10-CM | POA: Diagnosis not present

## 2015-02-13 DIAGNOSIS — H1013 Acute atopic conjunctivitis, bilateral: Secondary | ICD-10-CM | POA: Diagnosis not present

## 2015-02-13 NOTE — Telephone Encounter (Signed)
Patient received her Nicoderm patches

## 2015-02-25 ENCOUNTER — Ambulatory Visit (INDEPENDENT_AMBULATORY_CARE_PROVIDER_SITE_OTHER): Payer: Medicare Other | Admitting: Internal Medicine

## 2015-02-26 ENCOUNTER — Ambulatory Visit (INDEPENDENT_AMBULATORY_CARE_PROVIDER_SITE_OTHER): Payer: Medicare Other | Admitting: Internal Medicine

## 2015-03-03 ENCOUNTER — Encounter (INDEPENDENT_AMBULATORY_CARE_PROVIDER_SITE_OTHER): Payer: Self-pay | Admitting: Internal Medicine

## 2015-03-03 ENCOUNTER — Ambulatory Visit (INDEPENDENT_AMBULATORY_CARE_PROVIDER_SITE_OTHER): Payer: Medicare Other | Admitting: Internal Medicine

## 2015-03-03 VITALS — BP 120/72 | HR 80 | Temp 98.1°F | Ht 62.5 in | Wt 123.0 lb

## 2015-03-03 DIAGNOSIS — R748 Abnormal levels of other serum enzymes: Secondary | ICD-10-CM | POA: Diagnosis not present

## 2015-03-03 NOTE — Progress Notes (Signed)
Subjective:    Patient ID: [name], female    DOB: [birth year], 54 y.o.   MRN: [id number]  HPI Here today due to elevated liver enzymes.  Labs at DR. Hawkins in September revealed ALP 139, AST 58, ALT 48. Bili 0.9.  Her liver numbers have never been elevated. She tells me she drinks etoh which is not often. She has not drank any etoh since July 4th. She has never done IV drugs. She has one tattoo professional done.  Hx of  Small bowel Crohns.   Maintained on 6MP.   She has 3 stools a day and they are formed. She denies melena or BRRB Her appetite is good. No weight loss. She has actually gained 11 pounds since her last visit in 2015.    12/24/2014 H and H 15.0 and 42.4, [place] 202.    11/21/2013: EGD Indications: Patient is a 54 year old Caucasian female with history of small bowel Crohn's disease who presents with 2 week history of melena epigastric pain she also lost about 8 pounds. Patient states her pain medication was ordered and she took some BC powder. H&H was normal. Melena has cleared. Heartburn is well controlled with PPI.  Impression: Small sliding hiatal hernia but no changes of reflux esophagitis. Pyloric channel inflammation but no evidence of peptic ulcer disease or gastroduodenal Crohn's disease.   09/01/2010 Colonoscopy: 1. Active small bowel Crohn disease with multiple ulcers, but no evidence  of stricture.  2. Colonic disease is limited to rectal disease with single ulcer.  It is interesting to note that she is on prednisone 30 mg a day and not in  remission.   Review of Systems Past Medical History  Diagnosis Date  . Rectal bleeding 10/01/2010  . Lower abdominal pain 10/01/2010  . Diarrhea 10/01/2010  . Anorexia 10/01/2010  . Emesis 10/01/2010  . Crohn's 10/01/2010  . GERD (gastroesophageal reflux disease)   . Chronic pain  syndrome   . Arthritis     Past Surgical History  Procedure Laterality Date  . Colonoscopy  01/2006    October 2007 revealinga ileal ulcers proximal to anastomosis without stricture and a single rectal ulcer.  .  partial hysterectomy  about 20 years ago  . Cholecystectomy    . Tooth extraction Left 10/2013    left lower molar  . Esophagogastroduodenoscopy (egd) with propofol N/A 11/21/2013    Procedure: ESOPHAGOGASTRODUODENOSCOPY (EGD) WITH PROPOFOL;  Surgeon: Rogene Houston, MD;  Location: [facility];  Service: Endoscopy;  Laterality: N/A;    No Known Allergies  Current Outpatient Prescriptions on File Prior to Visit  Medication Sig Dispense Refill  . ALPRAZolam (XANAX) 0.5 MG tablet Take 0.5 mg by mouth at bedtime as needed.      . cholecalciferol (VITAMIN D) 1000 UNITS tablet Take by mouth daily. 400 mg takes 1 tab daily    . gelatin 650 MG capsule Take 650 mg by mouth daily.     . Magnesium 250 MG TABS Take 250 mg by mouth daily.    . mercaptopurine (PURINETHOL) 50 MG tablet TAKE 1 AND 1/2 TABLET BY MOUTH EVERY DAY 45 tablet 5  . mercaptopurine (PURINETHOL) 50 MG tablet Take 1 tablet (50 mg total) by mouth daily. 30 tablet 5  . multivitamin (THERAGRAN) per tablet Take 1 tablet by mouth daily.      . nicotine (NICODERM CQ) 21 mg/24hr patch Place 1 patch (21 mg total) onto the skin daily. 28 patch 0  . omeprazole (PRILOSEC)  20 MG capsule TAKE 1 CAPSULE BY MOUTH ONCE DAILY. 30 capsule 5  . oxyCODONE (OXYCONTIN) 40 MG 12 hr tablet Take 40 mg by mouth 2 (two) times daily.     . promethazine (PHENERGAN) 25 MG tablet      No current facility-administered medications on file prior to visit.        Objective:   Physical Exam Blood pressure 120/72, pulse 80, temperature 98.1 F (36.7 C), height 5' 2.5" (1.588 m), weight 123 lb (55.792 kg). Alert and oriented. Skin warm and dry. Oral mucosa is moist.   . Sclera anicteric, conjunctivae is pink. Thyroid not enlarged. No cervical  lymphadenopathy. Lungs clear. Heart regular rate and rhythm.  Abdomen is soft. Bowel sounds are positive. No hepatomegaly. No abdominal masses felt. No tenderness.  No edema to lower extremities. P        Assessment & Plan:  Elevated liver enzymes. Will repeat CMET, Hepatitis Panel,  Sedrate.  If liver enzymes still elevated after this blood draw: SMA, ANA, ferritin, Iron, alpha 1 antitrypsin, Ceruloplasmin, US abdomen.

## 2015-03-03 NOTE — Patient Instructions (Signed)
OV in 3 months.

## 2015-03-04 LAB — HEPATITIS PANEL, ACUTE
HCV Ab: NEGATIVE
Hep A IgM: NONREACTIVE
Hep B C IgM: NONREACTIVE
Hepatitis B Surface Ag: NEGATIVE

## 2015-03-04 LAB — COMPREHENSIVE METABOLIC PANEL
ALT: 26 U/L (ref 6–29)
AST: 29 U/L (ref 10–35)
Albumin: 4.3 g/dL (ref 3.6–5.1)
Alkaline Phosphatase: 85 U/L (ref 33–130)
BUN: 12 mg/dL (ref 7–25)
CO2: 33 mmol/L — ABNORMAL HIGH (ref 20–31)
Calcium: 9.8 mg/dL (ref 8.6–10.4)
Chloride: 102 mmol/L (ref 98–110)
Creat: 0.73 mg/dL (ref 0.50–1.05)
Glucose, Bld: 74 mg/dL (ref 65–99)
Potassium: 6 mmol/L — ABNORMAL HIGH (ref 3.5–5.3)
Sodium: 138 mmol/L (ref 135–146)
Total Bilirubin: 0.5 mg/dL (ref 0.2–1.2)
Total Protein: 7.2 g/dL (ref 6.1–8.1)

## 2015-03-04 LAB — SEDIMENTATION RATE: Sed Rate: 20 mm/hr (ref 0–30)

## 2015-03-05 ENCOUNTER — Telehealth (INDEPENDENT_AMBULATORY_CARE_PROVIDER_SITE_OTHER): Payer: Self-pay | Admitting: *Deleted

## 2015-03-05 DIAGNOSIS — R748 Abnormal levels of other serum enzymes: Secondary | ICD-10-CM

## 2015-03-05 NOTE — Telephone Encounter (Signed)
.  Per Lelon Perla patient to have labs in 3 months.

## 2015-03-06 ENCOUNTER — Encounter (INDEPENDENT_AMBULATORY_CARE_PROVIDER_SITE_OTHER): Payer: Self-pay

## 2015-03-11 ENCOUNTER — Telehealth (INDEPENDENT_AMBULATORY_CARE_PROVIDER_SITE_OTHER): Payer: Self-pay | Admitting: Internal Medicine

## 2015-03-11 DIAGNOSIS — E875 Hyperkalemia: Secondary | ICD-10-CM | POA: Diagnosis not present

## 2015-03-11 LAB — POTASSIUM: Potassium: 4.4 mmol/L (ref 3.5–5.3)

## 2015-03-11 NOTE — Telephone Encounter (Signed)
Linder will go to lab today and get a potassium level. She is not only any supplemental potassium.

## 2015-05-12 ENCOUNTER — Other Ambulatory Visit (INDEPENDENT_AMBULATORY_CARE_PROVIDER_SITE_OTHER): Payer: Self-pay | Admitting: *Deleted

## 2015-05-12 ENCOUNTER — Encounter (INDEPENDENT_AMBULATORY_CARE_PROVIDER_SITE_OTHER): Payer: Self-pay | Admitting: *Deleted

## 2015-05-12 DIAGNOSIS — R748 Abnormal levels of other serum enzymes: Secondary | ICD-10-CM

## 2015-06-03 ENCOUNTER — Ambulatory Visit (INDEPENDENT_AMBULATORY_CARE_PROVIDER_SITE_OTHER): Payer: Medicare Other | Admitting: Internal Medicine

## 2015-06-04 ENCOUNTER — Other Ambulatory Visit (INDEPENDENT_AMBULATORY_CARE_PROVIDER_SITE_OTHER): Payer: Self-pay | Admitting: Internal Medicine

## 2015-06-05 NOTE — Telephone Encounter (Signed)
[name] left a message saying she went to the pharmacy today and the Mercaptopurine still wasn't ready. She'd like someone here to call the pharmacy because she has one pill left.   Thank you.

## 2015-06-06 ENCOUNTER — Telehealth (INDEPENDENT_AMBULATORY_CARE_PROVIDER_SITE_OTHER): Payer: Self-pay | Admitting: *Deleted

## 2015-06-06 ENCOUNTER — Encounter (INDEPENDENT_AMBULATORY_CARE_PROVIDER_SITE_OTHER): Payer: Self-pay | Admitting: *Deleted

## 2015-06-06 DIAGNOSIS — K50918 Crohn's disease, unspecified, with other complication: Secondary | ICD-10-CM

## 2015-06-06 LAB — CBC WITH DIFFERENTIAL/PLATELET
Basophils Absolute: 0 10*3/uL (ref 0.0–0.1)
Basophils Relative: 0 % (ref 0–1)
Eosinophils Absolute: 0.1 10*3/uL (ref 0.0–0.7)
Eosinophils Relative: 1 % (ref 0–5)
HCT: 44.7 % (ref 36.0–46.0)
Hemoglobin: 15.7 g/dL — ABNORMAL HIGH (ref 12.0–15.0)
Lymphocytes Relative: 22 % (ref 12–46)
Lymphs Abs: 1.7 10*3/uL (ref 0.7–4.0)
MCH: 34.7 pg — ABNORMAL HIGH (ref 26.0–34.0)
MCHC: 35.1 g/dL (ref 30.0–36.0)
MCV: 98.9 fL (ref 78.0–100.0)
MPV: 12 fL (ref 8.6–12.4)
Monocytes Absolute: 0.5 10*3/uL (ref 0.1–1.0)
Monocytes Relative: 6 % (ref 3–12)
Neutro Abs: 5.6 10*3/uL (ref 1.7–7.7)
Neutrophils Relative %: 71 % (ref 43–77)
Platelets: 180 10*3/uL (ref 150–400)
RBC: 4.52 MIL/uL (ref 3.87–5.11)
RDW: 14.4 % (ref 11.5–15.5)
WBC: 7.9 10*3/uL (ref 4.0–10.5)

## 2015-06-06 NOTE — Telephone Encounter (Signed)
Per Dr.Rehman the patient will need to have labs drawn. Due to the medication patient is taking, she is to have labs every 3 months.

## 2015-06-09 ENCOUNTER — Telehealth (INDEPENDENT_AMBULATORY_CARE_PROVIDER_SITE_OTHER): Payer: Self-pay | Admitting: *Deleted

## 2015-06-09 DIAGNOSIS — K5010 Crohn's disease of large intestine without complications: Secondary | ICD-10-CM

## 2015-06-09 NOTE — Telephone Encounter (Signed)
Per Dr.Rehman the patient will need to have labs drawn in 4 months.

## 2015-06-12 ENCOUNTER — Other Ambulatory Visit (INDEPENDENT_AMBULATORY_CARE_PROVIDER_SITE_OTHER): Payer: Self-pay | Admitting: Internal Medicine

## 2015-06-12 ENCOUNTER — Telehealth (INDEPENDENT_AMBULATORY_CARE_PROVIDER_SITE_OTHER): Payer: Self-pay | Admitting: Internal Medicine

## 2015-06-12 NOTE — Telephone Encounter (Signed)
It looks like this has been ordered

## 2015-06-13 NOTE — Telephone Encounter (Signed)
error 

## 2015-06-19 ENCOUNTER — Ambulatory Visit (INDEPENDENT_AMBULATORY_CARE_PROVIDER_SITE_OTHER): Payer: Medicare Other | Admitting: Internal Medicine

## 2015-06-20 ENCOUNTER — Encounter (INDEPENDENT_AMBULATORY_CARE_PROVIDER_SITE_OTHER): Payer: Self-pay | Admitting: Internal Medicine

## 2015-06-27 ENCOUNTER — Other Ambulatory Visit (HOSPITAL_COMMUNITY): Payer: Self-pay | Admitting: Pulmonary Disease

## 2015-06-27 DIAGNOSIS — Z1231 Encounter for screening mammogram for malignant neoplasm of breast: Secondary | ICD-10-CM

## 2015-07-01 ENCOUNTER — Other Ambulatory Visit (INDEPENDENT_AMBULATORY_CARE_PROVIDER_SITE_OTHER): Payer: Self-pay | Admitting: Internal Medicine

## 2015-07-04 ENCOUNTER — Ambulatory Visit (INDEPENDENT_AMBULATORY_CARE_PROVIDER_SITE_OTHER): Payer: Medicare Other | Admitting: Internal Medicine

## 2015-07-04 ENCOUNTER — Encounter (INDEPENDENT_AMBULATORY_CARE_PROVIDER_SITE_OTHER): Payer: Self-pay | Admitting: Internal Medicine

## 2015-07-04 VITALS — BP 100/80 | HR 64 | Temp 98.7°F | Ht 62.5 in | Wt 125.0 lb

## 2015-07-04 DIAGNOSIS — R748 Abnormal levels of other serum enzymes: Secondary | ICD-10-CM

## 2015-07-04 DIAGNOSIS — K5000 Crohn's disease of small intestine without complications: Secondary | ICD-10-CM | POA: Diagnosis not present

## 2015-07-04 LAB — CBC WITH DIFFERENTIAL/PLATELET
Basophils Absolute: 0 10*3/uL (ref 0.0–0.1)
Basophils Relative: 0 % (ref 0–1)
Eosinophils Absolute: 0.1 10*3/uL (ref 0.0–0.7)
Eosinophils Relative: 1 % (ref 0–5)
HCT: 39.8 % (ref 36.0–46.0)
Hemoglobin: 13.9 g/dL (ref 12.0–15.0)
Lymphocytes Relative: 22 % (ref 12–46)
Lymphs Abs: 1.5 10*3/uL (ref 0.7–4.0)
MCH: 34.7 pg — ABNORMAL HIGH (ref 26.0–34.0)
MCHC: 34.9 g/dL (ref 30.0–36.0)
MCV: 99.3 fL (ref 78.0–100.0)
MPV: 12 fL (ref 8.6–12.4)
Monocytes Absolute: 0.5 10*3/uL (ref 0.1–1.0)
Monocytes Relative: 7 % (ref 3–12)
Neutro Abs: 4.8 10*3/uL (ref 1.7–7.7)
Neutrophils Relative %: 70 % (ref 43–77)
Platelets: 170 10*3/uL (ref 150–400)
RBC: 4.01 MIL/uL (ref 3.87–5.11)
RDW: 14.2 % (ref 11.5–15.5)
WBC: 6.9 10*3/uL (ref 4.0–10.5)

## 2015-07-04 LAB — HEPATIC FUNCTION PANEL
ALT: 31 U/L — ABNORMAL HIGH (ref 6–29)
AST: 30 U/L (ref 10–35)
Albumin: 4.1 g/dL (ref 3.6–5.1)
Alkaline Phosphatase: 81 U/L (ref 33–130)
Bilirubin, Direct: 0.1 mg/dL (ref <=0.2)
Indirect Bilirubin: 0.3 mg/dL (ref 0.2–1.2)
Total Bilirubin: 0.4 mg/dL (ref 0.2–1.2)
Total Protein: 6.6 g/dL (ref 6.1–8.1)

## 2015-07-04 LAB — C-REACTIVE PROTEIN: CRP: <0.5 mg/dL (ref <0.60)

## 2015-07-04 NOTE — Progress Notes (Addendum)
Subjective:    Patient ID: [name], female    DOB: 04-Mar-1961, 55 y.o.   MRN: [id number]  HPI Here today for f/u. She was last seen in November. She was referred by Dr. Luan Pulling for elevated liver enzymes.  Repeat liver enzymes were normal.  She tells me today that she eats a lot of yogurt and fruit. If she eats something solid she feels bloated. She has a BM daily. No melena or BRRRB Appetite is good. She has gained 2 pounds.  She has bought a tread mill and is trying to exercise.  Her last colonoscopy was in 2012.     Labs at DR. Hawkins in September revealed ALP 139, AST 58, ALT 48. Bili 0.9. H  She has never done IV drugs. She has one tattoo professional done.  Hx of Small bowel Crohns. Maintained on 6MP.  She has 3 stools a day and they are formed. She denies melena or BRRB Her appetite is good. No weight loss. She has actually gained 11 pounds since her last visit in 2015.  Hepatic Function Panel     Component Value Date/Time   PROT 7.2 03/03/2015 1452   ALBUMIN 4.3 03/03/2015 1452   AST 29 03/03/2015 1452   ALT 26 03/03/2015 1452   ALKPHOS 85 03/03/2015 1452   BILITOT 0.5 03/03/2015 1452   BILIDIR 0.1 11/28/2012 1644   IBILI 0.3 11/28/2012 1644        11/21/2013 EGD:  Melena  Impression: Small sliding hiatal hernia but no changes of reflux esophagitis. Pyloric channel inflammation but no evidence of peptic ulcer disease or gastroduodenal Crohn's disease.  09/01/2010 Colonoscopy: 1. Active small bowel Crohn disease with multiple ulcers, but no evidence  of stricture.  2. Colonic disease is limited to rectal disease with single ulcer.  It is interesting to note that she is on prednisone 30 mg a day and not in   Review of Systems Past Medical History  Diagnosis Date  . Rectal bleeding 10/01/2010  . Lower abdominal pain 10/01/2010  . Diarrhea 10/01/2010  . Anorexia 10/01/2010  . Emesis 10/01/2010  . Crohn's 10/01/2010  . GERD  (gastroesophageal reflux disease)   . Chronic pain syndrome   . Arthritis     Past Surgical History  Procedure Laterality Date  . Colonoscopy  01/2006    October 2007 revealinga ileal ulcers proximal to anastomosis without stricture and a single rectal ulcer.  .  partial hysterectomy  about 20 years ago  . Cholecystectomy    . Tooth extraction Left 10/2013    left lower molar  . Esophagogastroduodenoscopy (egd) with propofol N/A 11/21/2013    Procedure: ESOPHAGOGASTRODUODENOSCOPY (EGD) WITH PROPOFOL;  Surgeon: Rogene Houston, MD;  Location: [facility];  Service: Endoscopy;  Laterality: N/A;    No Known Allergies  Current Outpatient Prescriptions on File Prior to Visit  Medication Sig Dispense Refill  . ALPRAZolam (XANAX) 0.5 MG tablet Take 0.5 mg by mouth at bedtime as needed.      . cholecalciferol (VITAMIN D) 1000 UNITS tablet Take by mouth daily. 400 mg takes 1 tab daily    . gelatin 650 MG capsule Take 650 mg by mouth daily.     . mercaptopurine (PURINETHOL) 50 MG tablet TAKE 1 TABLET BY MOUTH ONCE DAILY. 30 tablet 0  . multivitamin (THERAGRAN) per tablet Take 1 tablet by mouth daily.      . nicotine (NICODERM CQ) 21 mg/24hr patch Place 1 patch (21 mg total) onto  the skin daily. 28 patch 0  . omeprazole (PRILOSEC) 20 MG capsule TAKE 1 CAPSULE BY MOUTH ONCE DAILY. 30 capsule 0  . oxyCODONE (OXYCONTIN) 40 MG 12 hr tablet Take 40 mg by mouth 2 (two) times daily.     . promethazine (PHENERGAN) 25 MG tablet     . pyridOXINE (VITAMIN B-6) 100 MG tablet Take by mouth daily.     No current facility-administered medications on file prior to visit.        Objective:   Physical Exam Blood pressure 100/80, pulse 64, temperature 98.7 F (37.1 C), height 5' 2.5" (1.588 m), weight 125 lb (56.7 kg). Alert and oriented. Skin warm and dry. Oral mucosa is moist.   . Sclera anicteric, conjunctivae is pink. Thyroid not enlarged. No cervical lymphadenopathy. Lungs clear. Heart regular rate and  rhythm.  Abdomen is soft. Bowel sounds are positive. No hepatomegaly. No abdominal masses felt. No tenderness.  No edema to lower extremities.          Assessment & Plan:  Small bowel Crohn's. She seems to be doing well. No GI complaints though she does c/o bloating at times. Recent hx of bump in her transaminases. Will repeat Hepatic function today. Also get a CBC and CRP for her Crohn's disease. She will be put on a recall for a colonoscopy in May. She is due for surveillance.

## 2015-07-04 NOTE — Patient Instructions (Addendum)
Labs today. Recall for colonoscopy in May.

## 2015-07-07 ENCOUNTER — Other Ambulatory Visit: Payer: Medicaid Other | Admitting: Adult Health

## 2015-07-15 DIAGNOSIS — M545 Low back pain: Secondary | ICD-10-CM | POA: Diagnosis not present

## 2015-07-15 DIAGNOSIS — J449 Chronic obstructive pulmonary disease, unspecified: Secondary | ICD-10-CM | POA: Diagnosis not present

## 2015-07-15 DIAGNOSIS — K560 Paralytic ileus: Secondary | ICD-10-CM | POA: Diagnosis not present

## 2015-07-22 ENCOUNTER — Other Ambulatory Visit: Payer: Medicaid Other | Admitting: Adult Health

## 2015-07-24 ENCOUNTER — Ambulatory Visit (HOSPITAL_COMMUNITY)
Admission: RE | Admit: 2015-07-24 | Discharge: 2015-07-24 | Disposition: A | Discharge status: Home / Self Care | Payer: Medicare Other | Source: Ambulatory Visit | Attending: Pulmonary Disease | Admitting: Pulmonary Disease

## 2015-07-24 DIAGNOSIS — Z1231 Encounter for screening mammogram for malignant neoplasm of breast: Secondary | ICD-10-CM | POA: Diagnosis not present

## 2015-08-04 ENCOUNTER — Other Ambulatory Visit: Payer: Medicaid Other | Admitting: Adult Health

## 2015-08-04 ENCOUNTER — Encounter: Payer: Self-pay | Admitting: Adult Health

## 2015-08-07 ENCOUNTER — Other Ambulatory Visit (INDEPENDENT_AMBULATORY_CARE_PROVIDER_SITE_OTHER): Payer: Self-pay | Admitting: *Deleted

## 2015-08-07 ENCOUNTER — Telehealth (INDEPENDENT_AMBULATORY_CARE_PROVIDER_SITE_OTHER): Payer: Self-pay | Admitting: Internal Medicine

## 2015-08-07 NOTE — Telephone Encounter (Addendum)
Patient called, stated that she requested a refill from [facility] last week for Mercaptopurine, but nothing has been called in.  She is requesting a refill.  [phone]

## 2015-08-07 NOTE — Telephone Encounter (Signed)
This request has been sent to Terri to address.

## 2015-08-07 NOTE — Telephone Encounter (Signed)
Patient called and states that she called last week for this refill and it still has not went to pharmacy. She would appreciate it being refilled.

## 2015-08-12 ENCOUNTER — Telehealth (INDEPENDENT_AMBULATORY_CARE_PROVIDER_SITE_OTHER): Payer: Self-pay | Admitting: Internal Medicine

## 2015-08-12 MED ORDER — MERCAPTOPURINE 50 MG PO TABS: 50.0000 mg | ORAL_TABLET | Freq: Every day | ORAL | Status: DC

## 2015-08-12 NOTE — Telephone Encounter (Signed)
Rx has been sent  

## 2015-08-12 NOTE — Telephone Encounter (Signed)
Rx sent to her pharmacy 

## 2015-08-18 ENCOUNTER — Other Ambulatory Visit: Payer: Medicaid Other | Admitting: Adult Health

## 2015-08-18 ENCOUNTER — Encounter: Payer: Self-pay | Admitting: Adult Health

## 2015-08-29 ENCOUNTER — Telehealth (INDEPENDENT_AMBULATORY_CARE_PROVIDER_SITE_OTHER): Payer: Self-pay | Admitting: Internal Medicine

## 2015-08-29 DIAGNOSIS — F17200 Nicotine dependence, unspecified, uncomplicated: Secondary | ICD-10-CM

## 2015-08-29 MED ORDER — OMEPRAZOLE 20 MG PO CPDR: 20.0000 mg | DELAYED_RELEASE_CAPSULE | Freq: Every day | ORAL | Status: DC

## 2015-08-29 NOTE — Telephone Encounter (Signed)
Rx sent to her pharmacy 

## 2015-08-29 NOTE — Telephone Encounter (Signed)
Patient called, left a message requesting a refill on Omeprazole, would like it called in.  Otherwise she will be out for the weekend.  [phone]

## 2015-09-04 ENCOUNTER — Other Ambulatory Visit (INDEPENDENT_AMBULATORY_CARE_PROVIDER_SITE_OTHER): Payer: Self-pay | Admitting: *Deleted

## 2015-09-04 ENCOUNTER — Encounter (INDEPENDENT_AMBULATORY_CARE_PROVIDER_SITE_OTHER): Payer: Self-pay | Admitting: *Deleted

## 2015-09-04 DIAGNOSIS — K5010 Crohn's disease of large intestine without complications: Secondary | ICD-10-CM

## 2015-11-14 DIAGNOSIS — M545 Low back pain: Secondary | ICD-10-CM | POA: Diagnosis not present

## 2015-11-14 DIAGNOSIS — K5010 Crohn's disease of large intestine without complications: Secondary | ICD-10-CM | POA: Diagnosis not present

## 2015-11-14 DIAGNOSIS — J449 Chronic obstructive pulmonary disease, unspecified: Secondary | ICD-10-CM | POA: Diagnosis not present

## 2015-11-14 DIAGNOSIS — M810 Age-related osteoporosis without current pathological fracture: Secondary | ICD-10-CM | POA: Diagnosis not present

## 2015-11-14 LAB — CBC WITH DIFFERENTIAL/PLATELET
Basophils Absolute: 0 cells/uL (ref 0–200)
Basophils Relative: 0 %
Eosinophils Absolute: 58 cells/uL (ref 15–500)
Eosinophils Relative: 1 %
HCT: 41 % (ref 35.0–45.0)
Hemoglobin: 14.1 g/dL (ref 11.7–15.5)
Lymphocytes Relative: 23 %
Lymphs Abs: 1334 cells/uL (ref 850–3900)
MCH: 34.7 pg — ABNORMAL HIGH (ref 27.0–33.0)
MCHC: 34.4 g/dL (ref 32.0–36.0)
MCV: 101 fL — ABNORMAL HIGH (ref 80.0–100.0)
MPV: 12.8 fL — ABNORMAL HIGH (ref 7.5–12.5)
Monocytes Absolute: 348 cells/uL (ref 200–950)
Monocytes Relative: 6 %
Neutro Abs: 4060 cells/uL (ref 1500–7800)
Neutrophils Relative %: 70 %
Platelets: 145 10*3/uL (ref 140–400)
RBC: 4.06 MIL/uL (ref 3.80–5.10)
RDW: 13.5 % (ref 11.0–15.0)
WBC: 5.8 10*3/uL (ref 3.8–10.8)

## 2015-11-19 ENCOUNTER — Other Ambulatory Visit (INDEPENDENT_AMBULATORY_CARE_PROVIDER_SITE_OTHER): Payer: Self-pay | Admitting: *Deleted

## 2015-11-19 DIAGNOSIS — K5010 Crohn's disease of large intestine without complications: Secondary | ICD-10-CM

## 2015-12-10 ENCOUNTER — Other Ambulatory Visit: Payer: Medicaid Other | Admitting: Adult Health

## 2015-12-16 ENCOUNTER — Other Ambulatory Visit: Payer: Medicaid Other | Admitting: Adult Health

## 2015-12-25 ENCOUNTER — Other Ambulatory Visit: Payer: Medicaid Other | Admitting: Adult Health

## 2015-12-31 ENCOUNTER — Other Ambulatory Visit: Payer: Medicaid Other | Admitting: Adult Health

## 2016-01-08 ENCOUNTER — Other Ambulatory Visit: Payer: Medicaid Other | Admitting: Adult Health

## 2016-01-21 ENCOUNTER — Telehealth (INDEPENDENT_AMBULATORY_CARE_PROVIDER_SITE_OTHER): Payer: Self-pay | Admitting: Internal Medicine

## 2016-01-21 DIAGNOSIS — K219 Gastro-esophageal reflux disease without esophagitis: Secondary | ICD-10-CM

## 2016-01-21 MED ORDER — PANTOPRAZOLE SODIUM 40 MG PO TBEC: 40.0000 mg | DELAYED_RELEASE_TABLET | Freq: Every day | ORAL | 4 refills | Status: DC

## 2016-01-21 NOTE — Telephone Encounter (Signed)
Rx sent to her pharmacy 

## 2016-01-21 NOTE — Telephone Encounter (Signed)
Problem with indigestion. Rolaids, Copywriter, advertising. Taking Prilosec. Will switch to Protonix

## 2016-01-21 NOTE — Telephone Encounter (Signed)
Rx for Protonix sent to her pharmacy.

## 2016-01-21 NOTE — Telephone Encounter (Signed)
Patient called and would like to speak to Terri.  [phone]

## 2016-01-28 ENCOUNTER — Encounter (INDEPENDENT_AMBULATORY_CARE_PROVIDER_SITE_OTHER): Payer: Self-pay | Admitting: *Deleted

## 2016-02-26 ENCOUNTER — Encounter (INDEPENDENT_AMBULATORY_CARE_PROVIDER_SITE_OTHER): Payer: Self-pay | Admitting: *Deleted

## 2016-02-26 ENCOUNTER — Other Ambulatory Visit (INDEPENDENT_AMBULATORY_CARE_PROVIDER_SITE_OTHER): Payer: Self-pay | Admitting: *Deleted

## 2016-02-26 ENCOUNTER — Telehealth (INDEPENDENT_AMBULATORY_CARE_PROVIDER_SITE_OTHER): Payer: Self-pay | Admitting: *Deleted

## 2016-02-26 DIAGNOSIS — Z1211 Encounter for screening for malignant neoplasm of colon: Secondary | ICD-10-CM

## 2016-02-26 NOTE — Telephone Encounter (Signed)
Patient needs trilyte 

## 2016-02-27 MED ORDER — PEG 3350-KCL-NA BICARB-NACL 420 G PO SOLR: 4000.0000 mL | Freq: Once | ORAL | 0 refills | Status: AC

## 2016-03-05 ENCOUNTER — Telehealth (INDEPENDENT_AMBULATORY_CARE_PROVIDER_SITE_OTHER): Payer: Self-pay | Admitting: *Deleted

## 2016-03-05 NOTE — Telephone Encounter (Signed)
agree

## 2016-03-05 NOTE — Telephone Encounter (Signed)
Referring MD/PCP: hawkins   Procedure: tcs w propofol  Reason/Indication:  screening  Has patient had this procedure before?  Yes, 2007  If so, when, by whom and where?    Is there a family history of colon cancer?  no  Who?  What age when diagnosed?    Is patient diabetic?   no      Does patient have prosthetic heart valve or mechanical valve?  no  Do you have a pacemaker?  no  Has patient ever had endocarditis? no  Has patient had joint replacement within last 12 months? no  Does patient tend to be constipated or take laxatives? no  Does patient have a history of alcohol/drug use?  no  Is patient on Coumadin, Plavix and/or Aspirin? no  Medications: oxycotin 40 mg 1-3 tab daily, alprazolam 5 mg nightly, 30m, multi vit, vit d, pantoprazole 40 mg daily  Allergies: nkda  Medication Adjustment:   Procedure date & time: 04/02/16 at 100 preop 12/12 @ 11

## 2016-03-08 ENCOUNTER — Encounter (INDEPENDENT_AMBULATORY_CARE_PROVIDER_SITE_OTHER): Payer: Self-pay | Admitting: *Deleted

## 2016-03-08 ENCOUNTER — Other Ambulatory Visit (INDEPENDENT_AMBULATORY_CARE_PROVIDER_SITE_OTHER): Payer: Self-pay | Admitting: *Deleted

## 2016-03-08 DIAGNOSIS — K5010 Crohn's disease of large intestine without complications: Secondary | ICD-10-CM

## 2016-03-09 ENCOUNTER — Other Ambulatory Visit: Payer: Medicaid Other | Admitting: Adult Health

## 2016-03-15 ENCOUNTER — Other Ambulatory Visit (INDEPENDENT_AMBULATORY_CARE_PROVIDER_SITE_OTHER): Payer: Self-pay | Admitting: *Deleted

## 2016-03-15 ENCOUNTER — Telehealth (INDEPENDENT_AMBULATORY_CARE_PROVIDER_SITE_OTHER): Payer: Self-pay | Admitting: *Deleted

## 2016-03-15 MED ORDER — MERCAPTOPURINE 50 MG PO TABS: 50.0000 mg | ORAL_TABLET | Freq: Every day | ORAL | 5 refills | Status: DC

## 2016-03-15 NOTE — Telephone Encounter (Signed)
Patient left message, she needs refill on Mercaptopurine, she only has 1 left -- she tried calling it in Wednesday but it wouldn't go through, she called [facility] this morning and they advise her to call us  Ph# [phone]

## 2016-03-15 NOTE — Telephone Encounter (Signed)
A refill request has been sent to the patient's pharmacy,[facility]. Patient was called and made aware.

## 2016-03-24 ENCOUNTER — Other Ambulatory Visit: Payer: Medicaid Other | Admitting: Adult Health

## 2016-03-30 ENCOUNTER — Encounter (HOSPITAL_COMMUNITY)
Admission: RE | Admit: 2016-03-30 | Discharge: 2016-03-30 | Disposition: A | Discharge status: Home / Self Care | Payer: Medicare Other | Source: Ambulatory Visit | Attending: Internal Medicine | Admitting: Internal Medicine

## 2016-03-30 ENCOUNTER — Encounter (HOSPITAL_COMMUNITY): Payer: Self-pay

## 2016-03-30 ENCOUNTER — Other Ambulatory Visit: Payer: Self-pay

## 2016-03-30 DIAGNOSIS — K219 Gastro-esophageal reflux disease without esophagitis: Secondary | ICD-10-CM | POA: Diagnosis not present

## 2016-03-30 DIAGNOSIS — K5730 Diverticulosis of large intestine without perforation or abscess without bleeding: Secondary | ICD-10-CM | POA: Diagnosis not present

## 2016-03-30 DIAGNOSIS — J449 Chronic obstructive pulmonary disease, unspecified: Secondary | ICD-10-CM | POA: Diagnosis not present

## 2016-03-30 DIAGNOSIS — G894 Chronic pain syndrome: Secondary | ICD-10-CM | POA: Diagnosis not present

## 2016-03-30 DIAGNOSIS — Z79899 Other long term (current) drug therapy: Secondary | ICD-10-CM | POA: Diagnosis not present

## 2016-03-30 DIAGNOSIS — G709 Myoneural disorder, unspecified: Secondary | ICD-10-CM | POA: Diagnosis not present

## 2016-03-30 DIAGNOSIS — Z1211 Encounter for screening for malignant neoplasm of colon: Secondary | ICD-10-CM

## 2016-03-30 DIAGNOSIS — Z87891 Personal history of nicotine dependence: Secondary | ICD-10-CM | POA: Diagnosis not present

## 2016-03-30 DIAGNOSIS — Z01818 Encounter for other preprocedural examination: Secondary | ICD-10-CM

## 2016-03-30 DIAGNOSIS — K5090 Crohn's disease, unspecified, without complications: Secondary | ICD-10-CM | POA: Diagnosis not present

## 2016-03-30 HISTORY — DX: Chronic obstructive pulmonary disease, unspecified: J44.9

## 2016-03-30 LAB — CBC WITH DIFFERENTIAL/PLATELET
Basophils Absolute: 0 10*3/uL (ref 0.0–0.1)
Basophils Relative: 0 %
Eosinophils Absolute: 0 10*3/uL (ref 0.0–0.7)
Eosinophils Relative: 1 %
HCT: 36.5 % (ref 36.0–46.0)
Hemoglobin: 13 g/dL (ref 12.0–15.0)
Lymphocytes Relative: 28 %
Lymphs Abs: 1.6 10*3/uL (ref 0.7–4.0)
MCH: 35.5 pg — ABNORMAL HIGH (ref 26.0–34.0)
MCHC: 35.6 g/dL (ref 30.0–36.0)
MCV: 99.7 fL (ref 78.0–100.0)
Monocytes Absolute: 0.4 10*3/uL (ref 0.1–1.0)
Monocytes Relative: 7 %
Neutro Abs: 3.7 10*3/uL (ref 1.7–7.7)
Neutrophils Relative %: 64 %
Platelets: 141 10*3/uL — ABNORMAL LOW (ref 150–400)
RBC: 3.66 MIL/uL — ABNORMAL LOW (ref 3.87–5.11)
RDW: 13.2 % (ref 11.5–15.5)
WBC: 5.7 10*3/uL (ref 4.0–10.5)

## 2016-03-30 LAB — BASIC METABOLIC PANEL
Anion gap: 8 (ref 5–15)
BUN: 7 mg/dL (ref 6–20)
CO2: 25 mmol/L (ref 22–32)
Calcium: 9.1 mg/dL (ref 8.9–10.3)
Chloride: 105 mmol/L (ref 101–111)
Creatinine, Ser: 0.72 mg/dL (ref 0.44–1.00)
GFR calc Af Amer: >60 mL/min (ref >60)
GFR calc non Af Amer: >60 mL/min (ref >60)
Glucose, Bld: 94 mg/dL (ref 65–99)
Potassium: 3.5 mmol/L (ref 3.5–5.1)
Sodium: 138 mmol/L (ref 135–145)

## 2016-03-30 NOTE — Patient Instructions (Signed)
[name]  03/30/2016     @PREFPERIOPPHARMACY @   Your procedure is scheduled on  04/02/2016  Report to [facility] at  25  A.M.  Call this number if you have problems the morning of surgery:  [phone]   Remember:  Do not eat food or drink liquids after midnight.  Take these medicines the morning of surgery with A SIP OF WATER  Xanax, protonix, oxycodone.   Do not wear jewelry, make-up or nail polish.  Do not wear lotions, powders, or perfumes, or deoderant.  Do not shave 48 hours prior to surgery.  Men may shave face and neck.  Do not bring valuables to the hospital.  [facility] is not responsible for any belongings or valuables.  Contacts, dentures or bridgework may not be worn into surgery.  Leave your suitcase in the car.  After surgery it may be brought to your room.  For patients admitted to the hospital, discharge time will be determined by your treatment team.  Patients discharged the day of surgery will not be allowed to drive home.   Name and phone number of your driver:   family Special instructions:  Follow the diet and prep instructions given to you by Dr Olevia Perches office.  Please read over the following fact sheets that you were given. Anesthesia Post-op Instructions and Care and Recovery After Surgery       Colonoscopy, Adult A colonoscopy is an exam to look at the entire large intestine. During the exam, a lubricated, bendable tube is inserted into the anus and then passed into the rectum, colon, and other parts of the large intestine. A colonoscopy is often done as a part of normal colorectal screening or in response to certain symptoms, such as anemia, persistent diarrhea, abdominal pain, and blood in the stool. The exam can help screen for and diagnose medical problems, including:  Tumors.  Polyps.  Inflammation.  Areas of bleeding. Tell a health care provider about:  Any allergies you have.  All medicines you are taking,  including vitamins, herbs, eye drops, creams, and over-the-counter medicines.  Any problems you or family members have had with anesthetic medicines.  Any blood disorders you have.  Any surgeries you have had.  Any medical conditions you have.  Any problems you have had passing stool. What are the risks? Generally, this is a safe procedure. However, problems may occur, including:  Bleeding.  A tear in the intestine.  A reaction to medicines given during the exam.  Infection (rare). What happens before the procedure? Eating and drinking restrictions  Follow instructions from your health care provider about eating and drinking, which may include:  A few days before the procedure - follow a low-fiber diet. Avoid nuts, seeds, dried fruit, raw fruits, and vegetables.  1-3 days before the procedure - follow a clear liquid diet. Drink only clear liquids, such as clear broth or bouillon, black coffee or tea, clear juice, clear soft drinks or sports drinks, gelatin desert, and popsicles. Avoid any liquids that contain red or purple dye.  On the day of the procedure - do not eat or drink anything during the 2 hours before the procedure, or within the time period that your health care provider recommends. Bowel prep  If you were prescribed an oral bowel prep to clean out your colon:  Take it as told by your health care provider. Starting the day before your procedure, you will  need to drink a large amount of medicated liquid. The liquid will cause you to have multiple loose stools until your stool is almost clear or light green.  If your skin or anus gets irritated from diarrhea, you may use these to relieve the irritation:  Medicated wipes, such as adult wet wipes with aloe and vitamin E.  A skin soothing-product like petroleum jelly.  If you vomit while drinking the bowel prep, take a break for up to 60 minutes and then begin the bowel prep again. If vomiting continues and you cannot  take the bowel prep without vomiting, call your health care provider. General instructions  Ask your health care provider about changing or stopping your regular medicines. This is especially important if you are taking diabetes medicines or blood thinners.  Plan to have someone take you home from the hospital or clinic. What happens during the procedure?  An IV tube may be inserted into one of your veins.  You will be given medicine to help you relax (sedative).  To reduce your risk of infection:  Your health care team will wash or sanitize their hands.  Your anal area will be washed with soap.  You will be asked to lie on your side with your knees bent.  Your health care provider will lubricate a long, thin, flexible tube. The tube will have a camera and a light on the end.  The tube will be inserted into your anus.  The tube will be gently eased through your rectum and colon.  Air will be delivered into your colon to keep it open. You may feel some pressure or cramping.  The camera will be used to take images during the procedure.  A small tissue sample may be removed from your body to be examined under a microscope (biopsy). If any potential problems are found, the tissue will be sent to a lab for testing.  If small polyps are found, your health care provider may remove them and have them checked for cancer cells.  The tube that was inserted into your anus will be slowly removed. The procedure may vary among health care providers and hospitals. What happens after the procedure?  Your blood pressure, heart rate, breathing rate, and blood oxygen level will be monitored until the medicines you were given have worn off.  Do not drive for 24 hours after the exam.  You may have a small amount of blood in your stool.  You may pass gas and have mild abdominal cramping or bloating due to the air that was used to inflate your colon during the exam.  It is up to you to get the  results of your procedure. Ask your health care provider, or the department performing the procedure, when your results will be ready. This information is not intended to replace advice given to you by your health care provider. Make sure you discuss any questions you have with your health care provider. Document Released: 04/02/2000 Document Revised: 10/24/2015 Document Reviewed: 06/17/2015 Elsevier Interactive Patient Education  2017 [employer]  Colonoscopy, Adult, Care After This sheet gives you information about how to care for yourself after your procedure. Your health care provider may also give you more specific instructions. If you have problems or questions, contact your health care provider. What can I expect after the procedure? After the procedure, it is common to have:  A small amount of blood in your stool for 24 hours after the procedure.  Some gas.  Mild abdominal cramping or bloating. Follow these instructions at home: General instructions  For the first 24 hours after the procedure:  Do not drive or use machinery.  Do not sign important documents.  Do not drink alcohol.  Do your regular daily activities at a slower pace than normal.  Eat soft, easy-to-digest foods.  Rest often.  Take over-the-counter or prescription medicines only as told by your health care provider.  It is up to you to get the results of your procedure. Ask your health care provider, or the department performing the procedure, when your results will be ready. Relieving cramping and bloating  Try walking around when you have cramps or feel bloated.  Apply heat to your abdomen as told by your health care provider. Use a heat source that your health care provider recommends, such as a moist heat pack or a heating pad.  Place a towel between your skin and the heat source.  Leave the heat on for 20-30 minutes.  Remove the heat if your skin turns bright red. This is especially important  if you are unable to feel pain, heat, or cold. You may have a greater risk of getting burned. Eating and drinking  Drink enough fluid to keep your urine clear or pale yellow.  Resume your normal diet as instructed by your health care provider. Avoid heavy or fried foods that are hard to digest.  Avoid drinking alcohol for as long as instructed by your health care provider. Contact a health care provider if:  You have blood in your stool 2-3 days after the procedure. Get help right away if:  You have more than a small spotting of blood in your stool.  You pass large blood clots in your stool.  Your abdomen is swollen.  You have nausea or vomiting.  You have a fever.  You have increasing abdominal pain that is not relieved with medicine. This information is not intended to replace advice given to you by your health care provider. Make sure you discuss any questions you have with your health care provider. Document Released: 11/18/2003 Document Revised: 12/29/2015 Document Reviewed: 06/17/2015 Elsevier Interactive Patient Education  2017 [place] Anesthesia is a term that refers to techniques, procedures, and medicines that help a person stay safe and comfortable during a medical procedure. Monitored anesthesia care, or sedation, is one type of anesthesia. Your anesthesia specialist may recommend sedation if you will be having a procedure that does not require you to be unconscious, such as:  Cataract surgery.  A dental procedure.  A biopsy.  A colonoscopy. During the procedure, you may receive a medicine to help you relax (sedative). There are three levels of sedation:  Mild sedation. At this level, you may feel awake and relaxed. You will be able to follow directions.  Moderate sedation. At this level, you will be sleepy. You may not remember the procedure.  Deep sedation. At this level, you will be asleep. You will not remember the  procedure. The more medicine you are given, the deeper your level of sedation will be. Depending on how you respond to the procedure, the anesthesia specialist may change your level of sedation or the type of anesthesia to fit your needs. An anesthesia specialist will monitor you closely during the procedure. Let your health care provider know about:  Any allergies you have.  All medicines you are taking, including vitamins, herbs, eye drops, creams, and over-the-counter medicines.  Any use of steroids (  by mouth or as a cream).  Any problems you or family members have had with sedatives and anesthetic medicines.  Any blood disorders you have.  Any surgeries you have had.  Any medical conditions you have, such as sleep apnea.  Whether you are pregnant or may be pregnant.  Any use of cigarettes, alcohol, or street drugs. What are the risks? Generally, this is a safe procedure. However, problems may occur, including:  Getting too much medicine (oversedation).  Nausea.  Allergic reaction to medicines.  Trouble breathing. If this happens, a breathing tube may be used to help with breathing. It will be removed when you are awake and breathing on your own.  Heart trouble.  Lung trouble. Before the procedure Staying hydrated  Follow instructions from your health care provider about hydration, which may include:  Up to 2 hours before the procedure - you may continue to drink clear liquids, such as water, clear fruit juice, black coffee, and plain tea. Eating and drinking restrictions  Follow instructions from your health care provider about eating and drinking, which may include:  8 hours before the procedure - stop eating heavy meals or foods such as meat, fried foods, or fatty foods.  6 hours before the procedure - stop eating light meals or foods, such as toast or cereal.  6 hours before the procedure - stop drinking milk or drinks that contain milk.  2 hours before the  procedure - stop drinking clear liquids. Medicines  Ask your health care provider about:  Changing or stopping your regular medicines. This is especially important if you are taking diabetes medicines or blood thinners.  Taking medicines such as aspirin and ibuprofen. These medicines can thin your blood. Do not take these medicines before your procedure if your health care provider instructs you not to. Tests and exams  You will have a physical exam.  You may have blood tests done to show:  How well your kidneys and liver are working.  How well your blood can clot.  General instructions  Plan to have someone take you home from the hospital or clinic.  If you will be going home right after the procedure, plan to have someone with you for 24 hours. What happens during the procedure?  Your blood pressure, heart rate, breathing, level of pain and overall condition will be monitored.  An IV tube will be inserted into one of your veins.  Your anesthesia specialist will give you medicines as needed to keep you comfortable during the procedure. This may mean changing the level of sedation.  The procedure will be performed. After the procedure  Your blood pressure, heart rate, breathing rate, and blood oxygen level will be monitored until the medicines you were given have worn off.  Do not drive for 24 hours if you received a sedative.  You may:  Feel sleepy, clumsy, or nauseous.  Feel forgetful about what happened after the procedure.  Have a sore throat if you had a breathing tube during the procedure.  Vomit. This information is not intended to replace advice given to you by your health care provider. Make sure you discuss any questions you have with your health care provider. Document Released: 12/30/2004 Document Revised: 09/12/2015 Document Reviewed: 07/27/2015 Elsevier Interactive Patient Education  2017 [place] POST-ANESTHESIA  IMMEDIATELY  FOLLOWING SURGERY:  Do not drive or operate machinery for the first twenty four hours after surgery.  Do not make any important decisions for twenty four hours  after surgery or while taking narcotic pain medications or sedatives.  If you develop intractable nausea and vomiting or a severe headache please notify your doctor immediately.  FOLLOW-UP:  Please make an appointment with your surgeon as instructed. You do not need to follow up with anesthesia unless specifically instructed to do so.  WOUND CARE INSTRUCTIONS (if applicable):  Keep a dry clean dressing on the anesthesia/puncture wound site if there is drainage.  Once the wound has quit draining you may leave it open to air.  Generally you should leave the bandage intact for twenty four hours unless there is drainage.  If the epidural site drains for more than 36-48 hours please call the anesthesia department.  QUESTIONS?:  Please feel free to call your physician or the hospital operator if you have any questions, and they will be happy to assist you.

## 2016-04-02 ENCOUNTER — Ambulatory Visit (HOSPITAL_COMMUNITY): Payer: Medicare Other | Admitting: Anesthesiology

## 2016-04-02 ENCOUNTER — Encounter (HOSPITAL_COMMUNITY)
Admission: RE | Disposition: A | Discharge status: Home / Self Care | Payer: Self-pay | Source: Ambulatory Visit | Attending: Internal Medicine

## 2016-04-02 ENCOUNTER — Encounter (HOSPITAL_COMMUNITY): Payer: Self-pay | Admitting: Anesthesiology

## 2016-04-02 ENCOUNTER — Ambulatory Visit (HOSPITAL_COMMUNITY)
Admission: RE | Admit: 2016-04-02 | Discharge: 2016-04-02 | Disposition: A | Discharge status: Home / Self Care | Payer: Medicare Other | Source: Ambulatory Visit | Attending: Internal Medicine | Admitting: Internal Medicine

## 2016-04-02 DIAGNOSIS — K5730 Diverticulosis of large intestine without perforation or abscess without bleeding: Secondary | ICD-10-CM

## 2016-04-02 DIAGNOSIS — J449 Chronic obstructive pulmonary disease, unspecified: Secondary | ICD-10-CM | POA: Diagnosis not present

## 2016-04-02 DIAGNOSIS — Z1211 Encounter for screening for malignant neoplasm of colon: Secondary | ICD-10-CM | POA: Insufficient documentation

## 2016-04-02 DIAGNOSIS — G894 Chronic pain syndrome: Secondary | ICD-10-CM | POA: Insufficient documentation

## 2016-04-02 DIAGNOSIS — Z87891 Personal history of nicotine dependence: Secondary | ICD-10-CM | POA: Insufficient documentation

## 2016-04-02 DIAGNOSIS — K219 Gastro-esophageal reflux disease without esophagitis: Secondary | ICD-10-CM | POA: Insufficient documentation

## 2016-04-02 DIAGNOSIS — K5090 Crohn's disease, unspecified, without complications: Secondary | ICD-10-CM | POA: Insufficient documentation

## 2016-04-02 DIAGNOSIS — K633 Ulcer of intestine: Secondary | ICD-10-CM | POA: Diagnosis not present

## 2016-04-02 DIAGNOSIS — Z79899 Other long term (current) drug therapy: Secondary | ICD-10-CM | POA: Insufficient documentation

## 2016-04-02 DIAGNOSIS — G709 Myoneural disorder, unspecified: Secondary | ICD-10-CM | POA: Insufficient documentation

## 2016-04-02 HISTORY — PX: COLONOSCOPY WITH PROPOFOL: SHX5780

## 2016-04-02 SURGERY — COLONOSCOPY WITH PROPOFOL
Anesthesia: Monitor Anesthesia Care

## 2016-04-02 MED ORDER — MIDAZOLAM HCL 2 MG/2ML IJ SOLN
1.0000 mg | INTRAMUSCULAR | Status: DC | PRN
  Administered 2016-04-02: 2 mg via INTRAVENOUS

## 2016-04-02 MED ORDER — MIDAZOLAM HCL 2 MG/2ML IJ SOLN
INTRAMUSCULAR | Status: AC
  Filled 2016-04-02: qty 2

## 2016-04-02 MED ORDER — PROPOFOL 10 MG/ML IV BOLUS
INTRAVENOUS | Status: AC
  Filled 2016-04-02: qty 20

## 2016-04-02 MED ORDER — FENTANYL CITRATE (PF) 100 MCG/2ML IJ SOLN
INTRAMUSCULAR | Status: AC
  Filled 2016-04-02: qty 2

## 2016-04-02 MED ORDER — FENTANYL CITRATE (PF) 100 MCG/2ML IJ SOLN
25.0000 ug | INTRAMUSCULAR | Status: AC | PRN
Start: 2016-04-02 — End: 2016-04-02
  Administered 2016-04-02 (×2): 25 ug via INTRAVENOUS

## 2016-04-02 MED ORDER — PROPOFOL 500 MG/50ML IV EMUL
INTRAVENOUS | Status: DC | PRN
  Administered 2016-04-02: 150 ug/kg/min via INTRAVENOUS

## 2016-04-02 MED ORDER — STERILE WATER FOR IRRIGATION IR SOLN
Status: DC | PRN
  Administered 2016-04-02: 100 mL

## 2016-04-02 MED ORDER — MIDAZOLAM HCL 5 MG/5ML IJ SOLN
INTRAMUSCULAR | Status: DC | PRN
  Administered 2016-04-02: 2 mg via INTRAVENOUS

## 2016-04-02 MED ORDER — LACTATED RINGERS IV SOLN
INTRAVENOUS | Status: DC
  Administered 2016-04-02: 1000 mL via INTRAVENOUS

## 2016-04-02 NOTE — H&P (Addendum)
[name] is an 55 y.o. female.   Chief Complaint: Patient is here for colonoscopy. HPI: Patient is 55 year old Caucasian female was here for screening colonoscopy. She has history of small bowel Crohn's disease and remains in remission. She had distal ileal resection several years ago. She has 3-4 formed to semi-formed stools daily. She denies abdominal pain rectal bleeding. Last exam was 10 years ago revealing active small bowel Crohn's but no abnormalities noted in the colon. Family history is negative for CRC.  Past Medical History:  Diagnosis Date  . Anorexia 10/01/2010  . Arthritis   . Chronic pain syndrome    bones and stomach  . COPD (chronic obstructive pulmonary disease) ([place])   . Crohn's 10/01/2010  . Diarrhea 10/01/2010  . Emesis 10/01/2010  . GERD (gastroesophageal reflux disease)   . Lower abdominal pain 10/01/2010  . Rectal bleeding 10/01/2010    Past Surgical History:  Procedure Laterality Date  .  Partial Hysterectomy  about 20 years ago  . CHOLECYSTECTOMY    . COLONOSCOPY  01/2006   October 2007 revealinga ileal ulcers proximal to anastomosis without stricture and a single rectal ulcer.  . ESOPHAGOGASTRODUODENOSCOPY (EGD) WITH PROPOFOL N/A 11/21/2013   Procedure: ESOPHAGOGASTRODUODENOSCOPY (EGD) WITH PROPOFOL;  Surgeon: Rogene Houston, MD;  Location: [facility];  Service: Endoscopy;  Laterality: N/A;  . TOOTH EXTRACTION Left 10/2013   left lower molar    Family History  Problem Relation Age of Onset  . Dementia Mother   . Heart attack Mother     x 2  . Heart disease Father   . Heart attack Father   . Diabetes Sister   . Healthy Daughter   . Healthy Son   . Dementia Maternal Grandmother   . Cancer Maternal Grandmother     breast   Social History:  reports that she quit smoking about 4 months ago. Her smoking use included Cigarettes. She has a 8.75 pack-year smoking history. She has never used smokeless tobacco. She reports that she does not drink alcohol or  use drugs.  Allergies: No Known Allergies  Medications Prior to Admission  Medication Sig Dispense Refill  . ALPRAZolam (XANAX) 0.5 MG tablet Take 0.5 mg by mouth at bedtime as needed for sleep.     . Cholecalciferol (VITAMIN D3) 1000 units CAPS Take 1,000 Units by mouth daily.    Marland Kitchen GELATIN PO Take 2,600 mg by mouth daily.    Marland Kitchen Ketotifen Fumarate (REFRESH EYE ITCH RELIEF OP) Apply 1 drop to eye daily as needed (dry eyes).    . mercaptopurine (PURINETHOL) 50 MG tablet Take 1 tablet (50 mg total) by mouth daily. Give on an empty stomach 1 hour before or 2 hours after meals. Caution: Chemotherapy. 30 tablet 5  . Multiple Vitamin (MULTIVITAMIN WITH MINERALS) TABS tablet Take 1 tablet by mouth daily.    Marland Kitchen oxyCODONE (OXYCONTIN) 40 MG 12 hr tablet Take 40-120 mg by mouth 3 (three) times daily as needed for pain (depends on pain level if takes 40 mg -120 mg).     . pantoprazole (PROTONIX) 40 MG tablet Take 1 tablet (40 mg total) by mouth daily. 30 tablet 4  . mercaptopurine (PURINETHOL) 50 MG tablet Take 1 tablet (50 mg total) by mouth daily. Give on an empty stomach 1 hour before or 2 hours after meals. Caution: Chemotherapy. (Patient not taking: Reported on 03/29/2016) 30 tablet 5  . multivitamin (THERAGRAN) per tablet Take 1 tablet by mouth daily.      Marland Kitchen  nicotine (NICODERM CQ) 21 mg/24hr patch Place 1 patch (21 mg total) onto the skin daily. (Patient not taking: Reported on 03/29/2016) 28 patch 0  . omeprazole (PRILOSEC) 20 MG capsule Take 1 capsule (20 mg total) by mouth daily. (Patient not taking: Reported on 03/29/2016) 30 capsule 7    No results found for this or any previous visit (from the past 48 hour(s)). No results found.  ROS  Blood pressure 94/62, pulse 78, temperature 98 F (36.7 C), resp. rate 15, SpO2 96 %. Physical Exam  Constitutional: She appears well-developed and well-nourished.  HENT:  Mouth/Throat: Oropharynx is clear and moist.  Eyes: Conjunctivae are normal. No  scleral icterus.  Neck: No thyromegaly present.  Cardiovascular: Normal rate, regular rhythm and normal heart sounds.   No murmur heard. Respiratory: Effort normal and breath sounds normal.  GI:  Abdomen is symmetrical with lower midline scar. Abdomen soft and nontender without organomegaly or masses.   Musculoskeletal: She exhibits no edema.  Lymphadenopathy:    She has no cervical adenopathy.  Neurological: She is alert.  Skin: Skin is warm and dry.     Assessment/Plan Average risk screening colonoscopy.  Hildred Laser, MD 04/02/2016, 10:31 AM

## 2016-04-02 NOTE — Anesthesia Preprocedure Evaluation (Signed)
Anesthesia Evaluation  Patient identified by MRN, date of birth, ID band Patient awake    Reviewed: Allergy & Precautions, H&P , NPO status , Patient's Chart, lab work & pertinent test results  Airway Mallampati: II  TM Distance: >3 FB Neck ROM: Full    Dental  (+) Partial Upper, Teeth Intact   Pulmonary COPD, Current Smoker, former smoker,    breath sounds clear to auscultation- rhonchi       Cardiovascular negative cardio ROS   Rhythm:Regular Rate:Normal     Neuro/Psych PSYCHIATRIC DISORDERS (chronic pain syndrome)  Neuromuscular disease    GI/Hepatic GERD  Medicated and Controlled,  Endo/Other    Renal/GU      Musculoskeletal   Abdominal   Peds  Hematology   Anesthesia Other Findings   Reproductive/Obstetrics                             Anesthesia Physical Anesthesia Plan  ASA: II  Anesthesia Plan: MAC   Post-op Pain Management:    Induction: Intravenous  Airway Management Planned: Simple Face Mask  Additional Equipment:   Intra-op Plan:   Post-operative Plan:   Informed Consent: I have reviewed the patients History and Physical, chart, labs and discussed the procedure including the risks, benefits and alternatives for the proposed anesthesia with the patient or authorized representative who has indicated his/her understanding and acceptance.     Plan Discussed with:   Anesthesia Plan Comments:         Anesthesia Quick Evaluation

## 2016-04-02 NOTE — Transfer of Care (Signed)
Immediate Anesthesia Transfer of Care Note  Patient: YOMIRA FLITTON  Procedure(s) Performed: Procedure(s) with comments: COLONOSCOPY WITH PROPOFOL (N/A) - 1:00  Patient Location: PACU  Anesthesia Type:MAC  Level of Consciousness: awake, alert , oriented and patient cooperative  Airway & Oxygen Therapy: Patient Spontanous Breathing and Patient connected to nasal cannula oxygen  Post-op Assessment: Report given to RN and Post -op Vital signs reviewed and stable  Post vital signs: Reviewed and stable  Last Vitals:  Vitals:   04/02/16 1030 04/02/16 1035  BP: 93/66   Pulse:    Resp: 19 16  Temp:      Last Pain: There were no vitals filed for this visit.    Patients Stated Pain Goal: 0 (99/69/24 [id number])  Complications: No apparent anesthesia complications

## 2016-04-02 NOTE — Discharge Instructions (Signed)
Resume usual medications and diet. No driving for 24 hours. Next screening exam in 10 years.   Colonoscopy, Adult, Care After This sheet gives you information about how to care for yourself after your procedure. Your health care provider may also give you more specific instructions. If you have problems or questions, contact your health care provider. What can I expect after the procedure? After the procedure, it is common to have:  A small amount of blood in your stool for 24 hours after the procedure.  Some gas.  Mild abdominal cramping or bloating. Follow these instructions at home: General instructions  For the first 24 hours after the procedure:  Do not drive or use machinery.  Do not sign important documents.  Do not drink alcohol.  Do your regular daily activities at a slower pace than normal.  Eat soft, easy-to-digest foods.  Rest often.  Take over-the-counter or prescription medicines only as told by your health care provider.  It is up to you to get the results of your procedure. Ask your health care provider, or the department performing the procedure, when your results will be ready. Relieving cramping and bloating  Try walking around when you have cramps or feel bloated.  Apply heat to your abdomen as told by your health care provider. Use a heat source that your health care provider recommends, such as a moist heat pack or a heating pad.  Place a towel between your skin and the heat source.  Leave the heat on for 20-30 minutes.  Remove the heat if your skin turns bright red. This is especially important if you are unable to feel pain, heat, or cold. You may have a greater risk of getting burned. Eating and drinking  Drink enough fluid to keep your urine clear or pale yellow.  Resume your normal diet as instructed by your health care provider. Avoid heavy or fried foods that are hard to digest.  Avoid drinking alcohol for as long as instructed by your  health care provider. Contact a health care provider if:  You have blood in your stool 2-3 days after the procedure. Get help right away if:  You have more than a small spotting of blood in your stool.  You pass large blood clots in your stool.  Your abdomen is swollen.  You have nausea or vomiting.  You have a fever.  You have increasing abdominal pain that is not relieved with medicine. This information is not intended to replace advice given to you by your health care provider. Make sure you discuss any questions you have with your health care provider. Document Released: 11/18/2003 Document Revised: 12/29/2015 Document Reviewed: 06/17/2015 Elsevier Interactive Patient Education  2017 [employer].

## 2016-04-02 NOTE — Anesthesia Postprocedure Evaluation (Signed)
Anesthesia Post Note  Patient: [name]  Procedure(s) Performed: Procedure(s) (LRB): COLONOSCOPY WITH PROPOFOL (N/A)  Patient location during evaluation: PACU Anesthesia Type: MAC Level of consciousness: awake and alert and oriented Pain management: pain level controlled Vital Signs Assessment: post-procedure vital signs reviewed and stable Respiratory status: spontaneous breathing Cardiovascular status: stable Postop Assessment: no signs of nausea or vomiting Anesthetic complications: no    Last Vitals:  Vitals:   04/02/16 1030 04/02/16 1035  BP: 93/66   Pulse:    Resp: 19 16  Temp:      Last Pain: There were no vitals filed for this visit.               Glorya Bartley A

## 2016-04-02 NOTE — Anesthesia Procedure Notes (Signed)
Procedure Name: MAC Date/Time: 04/02/2016 10:36 AM Performed by: Andree Elk, AMY A Pre-anesthesia Checklist: Patient identified, Emergency Drugs available, Suction available, Patient being monitored and Timeout performed Oxygen Delivery Method: Simple face mask

## 2016-04-02 NOTE — Op Note (Signed)
[facility] Patient Name: [name] Procedure Date: 04/02/2016 10:09 AM MRN: [id number] Date of Birth: 04-14-1961 Attending MD: Hildred Laser , MD CSN: [id number] Age: 55 Admit Type: Outpatient Procedure:                Colonoscopy Indications:              Screening for colorectal malignant neoplasm Providers:                Hildred Laser, MD, Rosina Lowenstein, RN, Aram Candela Referring MD:             Jasper Loser. Luan Pulling, MD Medicines:                Monitored Anesthesia Care Complications:            No immediate complications. Estimated Blood Loss:     Estimated blood loss: none. Procedure:                Pre-Anesthesia Assessment:                           - Prior to the procedure, a History and Physical                            was performed, and patient medications and                            allergies were reviewed. The patient's tolerance of                            previous anesthesia was also reviewed. The risks                            and benefits of the procedure and the sedation                            options and risks were discussed with the patient.                            All questions were answered, and informed consent                            was obtained. Prior Anticoagulants: The patient has                            taken no previous anticoagulant or antiplatelet                            agents. ASA Grade Assessment: II - A patient with                            mild systemic disease. After reviewing the risks                            and benefits, the patient was deemed in  satisfactory condition to undergo the procedure.                           After obtaining informed consent, the colonoscope                            was passed under direct vision. Throughout the                            procedure, the patient's blood pressure, pulse, and                            oxygen saturations were monitored  continuously. The                            EC-[id number] ([id number]) scope was introduced through                            the and advanced to the the terminal ileum, with                            identification of the appendiceal orifice and IC                            valve. The colonoscopy was performed without                            difficulty. The patient tolerated the procedure                            well. The quality of the bowel preparation was                            adequate to identify polyps 6 mm and larger in                            size. The terminal ileum, ileocecal valve,                            appendiceal orifice, and rectum were photographed. Scope In: 10:43:31 AM Scope Out: 10:55:29 AM Scope Withdrawal Time: 0 hours 7 minutes 9 seconds  Total Procedure Duration: 0 hours 11 minutes 58 seconds  Findings:      The perianal exam findings include {skip}diminishedsphincter tone.      The distal ileum contained a few non-bleeding erosions.      A single diverticulum was found in the ascending colon.      A few medium-mouthed diverticula were found in the sigmoid colon.      The retroflexed view of the distal rectum and anal verge was normal and       showed no anal or rectal abnormalities. Impression:               - Diminishedsphincter tone found on perianal exam.                           -  A few erosions in the distal ileum secondary to                            Crohn's disease.                           - Diverticulosis in the ascending colon.                           - Diverticulosis in the sigmoid colon.                           - The distal rectum and anal verge are normal on                            retroflexion view.                           - No specimens collected. Moderate Sedation:      Per Anesthesia Care Recommendation:           - Patient has a contact number available for                            emergencies. The signs and  symptoms of potential                            delayed complications were discussed with the                            patient. Return to normal activities tomorrow.                            Written discharge instructions were provided to the                            patient.                           - Resume previous diet today.                           - Repeat colonoscopy in 10 years for screening                            purposes.                           - Continue present medications. Procedure Code(s):        --- Professional ---                           [id number], Colonoscopy, flexible; diagnostic, including                            collection of specimen(s) by brushing or washing,  when performed (separate procedure) Diagnosis Code(s):        --- Professional ---                           Z12.11, Encounter for screening for malignant                            neoplasm of colon                           K63.3, Ulcer of intestine                           K57.30, Diverticulosis of large intestine without                            perforation or abscess without bleeding CPT copyright 2016 American Medical Association. All rights reserved. The codes documented in this report are preliminary and upon coder review may  be revised to meet current compliance requirements. Hildred Laser, MD Hildred Laser, MD 04/02/2016 11:06:55 AM This report has been signed electronically. Number of Addenda: 0

## 2016-04-05 ENCOUNTER — Ambulatory Visit (INDEPENDENT_AMBULATORY_CARE_PROVIDER_SITE_OTHER): Payer: Medicare Other | Admitting: Adult Health

## 2016-04-05 ENCOUNTER — Encounter: Payer: Self-pay | Admitting: Adult Health

## 2016-04-05 VITALS — BP 130/80 | HR 70 | Ht 61.5 in | Wt 118.0 lb

## 2016-04-05 DIAGNOSIS — M545 Low back pain, unspecified: Secondary | ICD-10-CM

## 2016-04-05 DIAGNOSIS — M25519 Pain in unspecified shoulder: Secondary | ICD-10-CM

## 2016-04-05 DIAGNOSIS — R319 Hematuria, unspecified: Secondary | ICD-10-CM

## 2016-04-05 DIAGNOSIS — Z01411 Encounter for gynecological examination (general) (routine) with abnormal findings: Secondary | ICD-10-CM | POA: Diagnosis not present

## 2016-04-05 DIAGNOSIS — M542 Cervicalgia: Secondary | ICD-10-CM

## 2016-04-05 DIAGNOSIS — Z01419 Encounter for gynecological examination (general) (routine) without abnormal findings: Secondary | ICD-10-CM

## 2016-04-05 LAB — POCT URINALYSIS DIPSTICK
Glucose, UA: NEGATIVE
Ketones, UA: NEGATIVE
Leukocytes, UA: NEGATIVE
Nitrite, UA: NEGATIVE
Protein, UA: NEGATIVE

## 2016-04-05 MED ORDER — SULFAMETHOXAZOLE-TRIMETHOPRIM 800-160 MG PO TABS: 1.0000 | ORAL_TABLET | Freq: Two times a day (BID) | ORAL | 0 refills | Status: DC

## 2016-04-05 NOTE — Progress Notes (Signed)
Patient ID: [name], female   DOB: 27-Jun-1960, 55 y.o.   MRN: [id number] History of Present Illness: [name] is a 56 year old white female, divorced, in for well woman gyn exam, she is sp hysterectomy.Complains of low back pain since last week. PCP is Dr [name].    Current Medications, Allergies, Past Medical History, Past Surgical History, Family History and Social History were reviewed in [employer] record.     Review of Systems: Patient denies any headaches, hearing loss, fatigue, blurred vision, shortness of breath, chest pain, abdominal pain, problems with bowel movements, urination, or intercourse(not having sex). No mood swings. Has pain in left neck/shoulder area, she thinks breasts are heavy.Has pain on lpw back since last week.   Physical Exam:BP 130/80 (BP Location: Left Arm, Patient Position: Sitting, Cuff Size: Normal)   Pulse 70   Ht 5' 1.5" (1.562 m)   Wt 118 lb (53.5 kg)   BMI 21.93 kg/m Urine dipstick 2+ blood. General:  Well developed, well nourished, no acute distress Skin:  Warm and dry Neck:  Midline trachea, normal thyroid, good ROM, no lymphadenopathy. Tender over left shoulder/neck area, slight indentation left shoulder Lungs; Clear to auscultation bilaterally Breast:  No dominant palpable mass, retraction, or nipple discharge, wears 36 C Cardiovascular: Regular rate and rhythm Abdomen:  Soft, non tender, no hepatosplenomegaly,No CVAT, no pain on palpation of spine Pelvic:  External genitalia is normal in appearance, no lesions.  The vagina is normal in appearance. Urethra has no lesions or masses. The cervix and uterus are absent.  No adnexal masses or tenderness noted.Bladder is mildly tender, no masses felt. Rectal: deferred just had colonoscopy 12/15 Extremities/musculoskeletal:  No swelling or varicosities noted, no clubbing or cyanosis Psych:  No mood changes, alert and cooperative,seems happy PHQ 9 score 8, denies depression or  suicidal ideations and declines meds   Impression:  1. Well woman exam with routine gynecological exam   2. Hematuria, unspecified type   3. Acute midline low back pain without sciatica   4. Neck and shoulder pain      Plan: Rx septra ds 1 bid x 7 days #14 Push fluids UA C&S sent Review handout on UTI Referred to Dr [name] for neck/shoulder pain Physical in 1 year Mammogram yearly Labs with Dr Laural Saunders Colonoscopy per Dr Laural Saunders

## 2016-04-05 NOTE — Patient Instructions (Addendum)

## 2016-04-06 LAB — URINALYSIS, ROUTINE W REFLEX MICROSCOPIC
Bilirubin, UA: NEGATIVE
Glucose, UA: NEGATIVE
Ketones, UA: NEGATIVE
Nitrite, UA: NEGATIVE
Protein, UA: NEGATIVE
Specific Gravity, UA: 1.01 (ref 1.005–1.030)
Urobilinogen, Ur: 0.2 mg/dL (ref 0.2–1.0)
pH, UA: 6 (ref 5.0–7.5)

## 2016-04-06 LAB — MICROSCOPIC EXAMINATION
Bacteria, UA: NONE SEEN
Casts: NONE SEEN /lpf

## 2016-04-07 ENCOUNTER — Encounter (HOSPITAL_COMMUNITY): Payer: Self-pay | Admitting: Internal Medicine

## 2016-04-07 LAB — URINE CULTURE: Organism ID, Bacteria: NO GROWTH

## 2016-05-24 DIAGNOSIS — M545 Low back pain: Secondary | ICD-10-CM | POA: Diagnosis not present

## 2016-05-24 DIAGNOSIS — K5090 Crohn's disease, unspecified, without complications: Secondary | ICD-10-CM | POA: Diagnosis not present

## 2016-05-24 DIAGNOSIS — J449 Chronic obstructive pulmonary disease, unspecified: Secondary | ICD-10-CM | POA: Diagnosis not present

## 2016-07-19 ENCOUNTER — Telehealth (INDEPENDENT_AMBULATORY_CARE_PROVIDER_SITE_OTHER): Payer: Self-pay | Admitting: *Deleted

## 2016-07-19 ENCOUNTER — Telehealth (INDEPENDENT_AMBULATORY_CARE_PROVIDER_SITE_OTHER): Payer: Self-pay | Admitting: Internal Medicine

## 2016-07-19 DIAGNOSIS — K219 Gastro-esophageal reflux disease without esophagitis: Secondary | ICD-10-CM

## 2016-07-19 MED ORDER — PANTOPRAZOLE SODIUM 40 MG PO TBEC: 40.0000 mg | DELAYED_RELEASE_TABLET | Freq: Every day | ORAL | 11 refills | Status: DC

## 2016-07-19 NOTE — Telephone Encounter (Signed)
Protonix refilled

## 2016-07-19 NOTE — Telephone Encounter (Signed)
Patient left message, needs refill on Protonix, she called pharmacy but said she didn't have refills

## 2016-08-23 DIAGNOSIS — K5090 Crohn's disease, unspecified, without complications: Secondary | ICD-10-CM | POA: Diagnosis not present

## 2016-08-23 DIAGNOSIS — M545 Low back pain: Secondary | ICD-10-CM | POA: Diagnosis not present

## 2016-08-23 DIAGNOSIS — M25512 Pain in left shoulder: Secondary | ICD-10-CM | POA: Diagnosis not present

## 2016-08-23 DIAGNOSIS — J449 Chronic obstructive pulmonary disease, unspecified: Secondary | ICD-10-CM | POA: Diagnosis not present

## 2016-08-30 ENCOUNTER — Other Ambulatory Visit (HOSPITAL_COMMUNITY): Payer: Self-pay | Admitting: Pulmonary Disease

## 2016-08-30 DIAGNOSIS — D225 Melanocytic nevi of trunk: Secondary | ICD-10-CM | POA: Diagnosis not present

## 2016-08-30 DIAGNOSIS — Z1231 Encounter for screening mammogram for malignant neoplasm of breast: Secondary | ICD-10-CM

## 2016-08-30 DIAGNOSIS — X32XXXA Exposure to sunlight, initial encounter: Secondary | ICD-10-CM | POA: Diagnosis not present

## 2016-08-30 DIAGNOSIS — L570 Actinic keratosis: Secondary | ICD-10-CM | POA: Diagnosis not present

## 2016-09-09 ENCOUNTER — Ambulatory Visit (HOSPITAL_COMMUNITY)
Admission: RE | Admit: 2016-09-09 | Discharge: 2016-09-09 | Disposition: A | Discharge status: Home / Self Care | Payer: Medicare Other | Source: Ambulatory Visit | Attending: Pulmonary Disease | Admitting: Pulmonary Disease

## 2016-09-09 DIAGNOSIS — Z1231 Encounter for screening mammogram for malignant neoplasm of breast: Secondary | ICD-10-CM

## 2016-09-20 ENCOUNTER — Telehealth (INDEPENDENT_AMBULATORY_CARE_PROVIDER_SITE_OTHER): Payer: Self-pay | Admitting: Internal Medicine

## 2016-09-20 DIAGNOSIS — K5000 Crohn's disease of small intestine without complications: Secondary | ICD-10-CM

## 2016-09-20 MED ORDER — MERCAPTOPURINE 50 MG PO TABS: 50.0000 mg | ORAL_TABLET | Freq: Every day | ORAL | 5 refills | Status: DC

## 2016-09-20 NOTE — Telephone Encounter (Signed)
rx sent

## 2016-12-28 DIAGNOSIS — K5090 Crohn's disease, unspecified, without complications: Secondary | ICD-10-CM | POA: Diagnosis not present

## 2016-12-28 DIAGNOSIS — M545 Low back pain: Secondary | ICD-10-CM | POA: Diagnosis not present

## 2016-12-28 DIAGNOSIS — J449 Chronic obstructive pulmonary disease, unspecified: Secondary | ICD-10-CM | POA: Diagnosis not present

## 2016-12-28 DIAGNOSIS — M542 Cervicalgia: Secondary | ICD-10-CM | POA: Diagnosis not present

## 2017-01-03 ENCOUNTER — Telehealth (INDEPENDENT_AMBULATORY_CARE_PROVIDER_SITE_OTHER): Payer: Self-pay | Admitting: Internal Medicine

## 2017-01-03 NOTE — Telephone Encounter (Signed)
Patient called, lmoam that she is calling regarding some medication.  [phone]

## 2017-01-04 ENCOUNTER — Other Ambulatory Visit (HOSPITAL_COMMUNITY): Payer: Self-pay | Admitting: Pulmonary Disease

## 2017-01-04 ENCOUNTER — Ambulatory Visit (HOSPITAL_COMMUNITY)
Admission: RE | Admit: 2017-01-04 | Discharge: 2017-01-04 | Disposition: A | Discharge status: Home / Self Care | Payer: Medicare Other | Source: Ambulatory Visit | Attending: Pulmonary Disease | Admitting: Pulmonary Disease

## 2017-01-04 DIAGNOSIS — M25512 Pain in left shoulder: Secondary | ICD-10-CM

## 2017-01-04 DIAGNOSIS — S4992XA Unspecified injury of left shoulder and upper arm, initial encounter: Secondary | ICD-10-CM | POA: Diagnosis not present

## 2017-01-04 DIAGNOSIS — M19012 Primary osteoarthritis, left shoulder: Secondary | ICD-10-CM | POA: Diagnosis not present

## 2017-01-04 NOTE — Telephone Encounter (Signed)
Message left on answering machine

## 2017-01-05 NOTE — Telephone Encounter (Signed)
No answer at home #

## 2017-01-11 NOTE — Telephone Encounter (Signed)
I advised [name] to talk with her pharmacy concerning her medication. She says she has lost it. It she needs extra to get her thru the month, I will all it in.

## 2017-03-18 ENCOUNTER — Telehealth (INDEPENDENT_AMBULATORY_CARE_PROVIDER_SITE_OTHER): Payer: Self-pay | Admitting: Internal Medicine

## 2017-03-18 NOTE — Telephone Encounter (Signed)
Patient called, lmoam that she needs a refill on her Mercaptopurine.  She would liked this called to [place].  She stated that she will be out of it tomorrow.  [phone]

## 2017-03-21 ENCOUNTER — Other Ambulatory Visit (INDEPENDENT_AMBULATORY_CARE_PROVIDER_SITE_OTHER): Payer: Self-pay | Admitting: Internal Medicine

## 2017-03-21 DIAGNOSIS — K5000 Crohn's disease of small intestine without complications: Secondary | ICD-10-CM

## 2017-03-21 MED ORDER — MERCAPTOPURINE 50 MG PO TABS: 50.0000 mg | ORAL_TABLET | Freq: Every day | ORAL | 5 refills | Status: DC

## 2017-03-21 NOTE — Telephone Encounter (Signed)
Rx sent to her pharmacy 

## 2017-03-21 NOTE — Progress Notes (Signed)
err

## 2017-03-21 NOTE — Telephone Encounter (Signed)
I called the patient and advised her that it had been sent to her pharmacy

## 2017-03-30 DIAGNOSIS — Z79891 Long term (current) use of opiate analgesic: Secondary | ICD-10-CM | POA: Diagnosis not present

## 2017-03-30 DIAGNOSIS — J449 Chronic obstructive pulmonary disease, unspecified: Secondary | ICD-10-CM | POA: Diagnosis not present

## 2017-03-30 DIAGNOSIS — M545 Low back pain: Secondary | ICD-10-CM | POA: Diagnosis not present

## 2017-03-30 DIAGNOSIS — K5090 Crohn's disease, unspecified, without complications: Secondary | ICD-10-CM | POA: Diagnosis not present

## 2017-04-08 ENCOUNTER — Other Ambulatory Visit (INDEPENDENT_AMBULATORY_CARE_PROVIDER_SITE_OTHER): Payer: Self-pay | Admitting: Internal Medicine

## 2017-04-08 ENCOUNTER — Other Ambulatory Visit (INDEPENDENT_AMBULATORY_CARE_PROVIDER_SITE_OTHER): Payer: Self-pay | Admitting: *Deleted

## 2017-04-08 DIAGNOSIS — K5000 Crohn's disease of small intestine without complications: Secondary | ICD-10-CM

## 2017-04-08 DIAGNOSIS — K5090 Crohn's disease, unspecified, without complications: Secondary | ICD-10-CM

## 2017-05-26 DIAGNOSIS — K5090 Crohn's disease, unspecified, without complications: Secondary | ICD-10-CM | POA: Diagnosis not present

## 2017-05-26 DIAGNOSIS — J449 Chronic obstructive pulmonary disease, unspecified: Secondary | ICD-10-CM | POA: Diagnosis not present

## 2017-05-26 DIAGNOSIS — M545 Low back pain: Secondary | ICD-10-CM | POA: Diagnosis not present

## 2017-06-17 ENCOUNTER — Other Ambulatory Visit (INDEPENDENT_AMBULATORY_CARE_PROVIDER_SITE_OTHER): Payer: Self-pay | Admitting: Internal Medicine

## 2017-06-17 DIAGNOSIS — K219 Gastro-esophageal reflux disease without esophagitis: Secondary | ICD-10-CM

## 2017-06-30 DIAGNOSIS — M545 Low back pain: Secondary | ICD-10-CM | POA: Diagnosis not present

## 2017-06-30 DIAGNOSIS — J449 Chronic obstructive pulmonary disease, unspecified: Secondary | ICD-10-CM | POA: Diagnosis not present

## 2017-08-11 ENCOUNTER — Other Ambulatory Visit (HOSPITAL_COMMUNITY): Payer: Self-pay | Admitting: Pulmonary Disease

## 2017-08-11 DIAGNOSIS — K5090 Crohn's disease, unspecified, without complications: Secondary | ICD-10-CM | POA: Diagnosis not present

## 2017-08-11 DIAGNOSIS — R109 Unspecified abdominal pain: Secondary | ICD-10-CM

## 2017-08-11 DIAGNOSIS — J449 Chronic obstructive pulmonary disease, unspecified: Secondary | ICD-10-CM | POA: Diagnosis not present

## 2017-08-11 DIAGNOSIS — M545 Low back pain: Secondary | ICD-10-CM | POA: Diagnosis not present

## 2017-08-29 ENCOUNTER — Encounter (HOSPITAL_COMMUNITY): Payer: Self-pay

## 2017-08-29 ENCOUNTER — Ambulatory Visit (HOSPITAL_COMMUNITY)
Admission: RE | Admit: 2017-08-29 | Discharge: 2017-08-29 | Disposition: A | Discharge status: Home / Self Care | Payer: Medicare Other | Source: Ambulatory Visit | Attending: Pulmonary Disease | Admitting: Pulmonary Disease

## 2017-08-29 DIAGNOSIS — R109 Unspecified abdominal pain: Secondary | ICD-10-CM

## 2017-09-15 ENCOUNTER — Ambulatory Visit (HOSPITAL_COMMUNITY): Payer: Medicare Other

## 2017-10-05 ENCOUNTER — Other Ambulatory Visit (INDEPENDENT_AMBULATORY_CARE_PROVIDER_SITE_OTHER): Payer: Self-pay | Admitting: Internal Medicine

## 2017-10-05 DIAGNOSIS — K219 Gastro-esophageal reflux disease without esophagitis: Secondary | ICD-10-CM

## 2017-10-06 NOTE — Telephone Encounter (Signed)
Patient will need appointment prior to further refills, last seen in the office 2017. She has been given 6 months of this medication.

## 2017-10-19 ENCOUNTER — Other Ambulatory Visit (INDEPENDENT_AMBULATORY_CARE_PROVIDER_SITE_OTHER): Payer: Self-pay | Admitting: Internal Medicine

## 2017-10-19 DIAGNOSIS — K5000 Crohn's disease of small intestine without complications: Secondary | ICD-10-CM

## 2017-10-31 DIAGNOSIS — M545 Low back pain: Secondary | ICD-10-CM | POA: Diagnosis not present

## 2017-10-31 DIAGNOSIS — K5090 Crohn's disease, unspecified, without complications: Secondary | ICD-10-CM | POA: Diagnosis not present

## 2017-10-31 DIAGNOSIS — L989 Disorder of the skin and subcutaneous tissue, unspecified: Secondary | ICD-10-CM | POA: Diagnosis not present

## 2018-01-05 ENCOUNTER — Other Ambulatory Visit (HOSPITAL_COMMUNITY): Payer: Self-pay | Admitting: Pulmonary Disease

## 2018-01-05 DIAGNOSIS — Z1231 Encounter for screening mammogram for malignant neoplasm of breast: Secondary | ICD-10-CM

## 2018-01-12 ENCOUNTER — Ambulatory Visit (HOSPITAL_COMMUNITY): Payer: Medicare Other

## 2018-01-18 ENCOUNTER — Ambulatory Visit (HOSPITAL_COMMUNITY): Payer: Medicare Other

## 2018-01-31 DIAGNOSIS — J449 Chronic obstructive pulmonary disease, unspecified: Secondary | ICD-10-CM | POA: Diagnosis not present

## 2018-01-31 DIAGNOSIS — K5090 Crohn's disease, unspecified, without complications: Secondary | ICD-10-CM | POA: Diagnosis not present

## 2018-01-31 DIAGNOSIS — M545 Low back pain: Secondary | ICD-10-CM | POA: Diagnosis not present

## 2018-02-16 ENCOUNTER — Telehealth (INDEPENDENT_AMBULATORY_CARE_PROVIDER_SITE_OTHER): Payer: Self-pay | Admitting: Internal Medicine

## 2018-02-16 DIAGNOSIS — K5000 Crohn's disease of small intestine without complications: Secondary | ICD-10-CM

## 2018-02-16 MED ORDER — MERCAPTOPURINE 50 MG PO TABS: ORAL_TABLET | ORAL | 3 refills | Status: DC

## 2018-02-16 NOTE — Telephone Encounter (Signed)
Sent!

## 2018-02-16 NOTE — Telephone Encounter (Signed)
rx sent to her pharmacy

## 2018-02-16 NOTE — Telephone Encounter (Signed)
Patient called stated she needs refill on Mercaptopurine

## 2018-03-08 ENCOUNTER — Ambulatory Visit (HOSPITAL_COMMUNITY)
Admission: RE | Admit: 2018-03-08 | Discharge: 2018-03-08 | Disposition: A | Discharge status: Home / Self Care | Payer: Medicare Other | Source: Ambulatory Visit | Attending: Pulmonary Disease | Admitting: Pulmonary Disease

## 2018-03-08 DIAGNOSIS — Z1231 Encounter for screening mammogram for malignant neoplasm of breast: Secondary | ICD-10-CM | POA: Insufficient documentation

## 2018-04-03 ENCOUNTER — Other Ambulatory Visit (INDEPENDENT_AMBULATORY_CARE_PROVIDER_SITE_OTHER): Payer: Self-pay | Admitting: Internal Medicine

## 2018-04-03 DIAGNOSIS — K219 Gastro-esophageal reflux disease without esophagitis: Secondary | ICD-10-CM

## 2018-04-10 ENCOUNTER — Ambulatory Visit (INDEPENDENT_AMBULATORY_CARE_PROVIDER_SITE_OTHER): Payer: Medicare Other | Admitting: Internal Medicine

## 2018-04-13 ENCOUNTER — Ambulatory Visit (INDEPENDENT_AMBULATORY_CARE_PROVIDER_SITE_OTHER): Payer: Medicare Other | Admitting: Internal Medicine

## 2018-04-18 ENCOUNTER — Encounter (INDEPENDENT_AMBULATORY_CARE_PROVIDER_SITE_OTHER): Payer: Self-pay | Admitting: Internal Medicine

## 2018-04-18 ENCOUNTER — Ambulatory Visit (INDEPENDENT_AMBULATORY_CARE_PROVIDER_SITE_OTHER): Payer: Medicare Other | Admitting: Internal Medicine

## 2018-04-18 VITALS — BP 136/70 | HR 68 | Temp 98.0°F | Ht 62.5 in | Wt 124.0 lb

## 2018-04-18 DIAGNOSIS — K5000 Crohn's disease of small intestine without complications: Secondary | ICD-10-CM | POA: Diagnosis not present

## 2018-04-18 NOTE — Progress Notes (Signed)
Subjective:    Patient ID: [name], female    DOB: [birth year], 57 y.o.   MRN: [id number]  HPI Here today for f/u. Last seen in March of 2017. Hx of Crohn disease of small bowel. She is maintained on Mercaptopurine 18m daily.    He last colonoscopy was in December of 2017. Impression:               - Diminishedsphincter tone found on perianal exam.                           - A few erosions in the distal ileum secondary to                            Crohn's disease.                           - Diverticulosis in the ascending colon.                           - Diverticulosis in the sigmoid colon.                           - The distal rectum and anal verge are normal on                            retroflexion view.  11/21/2013 EGD:  Melena  Impression: Small sliding hiatal hernia but no changes of reflux esophagitis. Pyloric channel inflammation but no evidence of peptic ulcer disease or gastroduodenal Crohn's disease.  09/01/2010 Colonoscopy: 1. Active small bowel Crohn disease with multiple ulcers, but no evidence  of stricture.  2. Colonic disease is limited to rectal disease with single ulcer.  It is interesting to note that she is on prednisone 30 mg a day and not in     She says she is eating soup, yogurt,  She has a lot of nausea. The nausea is not new but is somewhat worse. She is learning what to eat and what to avoid.  She is eating a lot of fruits, vegetables, and yogurt. She is drinking Ensure.  BM x 1 a day. No melena or BRRB.   Review of Systems     Past Medical History:  Diagnosis Date  . Anorexia 10/01/2010  . Arthritis   . Chronic pain syndrome    bones and stomach  . [place]   . Crohn's 10/01/2010  . Diarrhea 10/01/2010  . Emesis 10/01/2010  . GERD (gastroesophageal reflux disease)   . Lower abdominal pain 10/01/2010  . Rectal bleeding 10/01/2010    Past Surgical History:  Procedure Laterality  Date  .  Partial Hysterectomy  about 20 years ago  . CHOLECYSTECTOMY    . COLONOSCOPY  01/2006   October 2007 revealinga ileal ulcers proximal to anastomosis without stricture and a single rectal ulcer.  . COLONOSCOPY WITH PROPOFOL N/A 04/02/2016   Procedure: COLONOSCOPY WITH PROPOFOL;  Surgeon: NRogene Houston MD;  [facility];  Service: Endoscopy;  Laterality: N/A;  1:00  . ESOPHAGOGASTRODUODENOSCOPY (EGD) WITH PROPOFOL N/A 11/21/2013   Procedure: ESOPHAGOGASTRODUODENOSCOPY (EGD) WITH PROPOFOL;  Surgeon: NRogene Houston MD;  Location: [facility];  Service: Endoscopy;  Laterality: N/A;  .  TOOTH EXTRACTION Left 10/2013   left lower molar    No Known Allergies  Current Outpatient Medications on File Prior to Visit  Medication Sig Dispense Refill  . ALPRAZolam (XANAX) 0.5 MG tablet Take 0.5 mg by mouth at bedtime as needed for sleep.     . Calcium Carbonate-Vitamin D (CALCIUM 500 + D PO) Take by mouth daily.    Marland Kitchen GELATIN PO Take 1,300 mg by mouth daily.     . mercaptopurine (PURINETHOL) 50 MG tablet TAKE 1 TABLET BY MOUTH ONCE DAILY; TAKE 1 HOUR BEFORE OR 2 HOURS AFTER MEALS. 30 tablet 3  . Multiple Vitamin (MULTIVITAMIN WITH MINERALS) TABS tablet Take 1 tablet by mouth daily.    Marland Kitchen oxyCODONE (OXYCONTIN) 40 MG 12 hr tablet Take 40-120 mg by mouth 3 (three) times daily as needed for pain (depends on pain level if takes 40 mg -120 mg).     . pantoprazole (PROTONIX) 40 MG tablet TAKE ONE TABLET BY MOUTH ONCE DAILY. 30 tablet 5  . sulfamethoxazole-trimethoprim (BACTRIM DS,SEPTRA DS) 800-160 MG tablet Take 1 tablet by mouth 2 (two) times daily. 14 tablet 0   No current facility-administered medications on file prior to visit.      Objective:   Physical Exam  Blood pressure 136/70, pulse 68, temperature 98 F (36.7 C), height 5' 2.5" (1.588 m), weight 124 lb (56.2 kg). Alert and oriented. Skin warm and dry. Oral mucosa is moist.   . Sclera anicteric, conjunctivae is pink. Thyroid not  enlarged. No cervical lymphadenopathy. Lungs clear. Heart regular rate and rhythm.  Abdomen is soft. Bowel sounds are positive. No hepatomegaly. No abdominal masses felt. No tenderness.  No edema to lower extremities.           Assessment & Plan:  Crohn's disease. Will get a CBC and CRP. She is doing well. She does have chronic nausea.  OV in 1 year.

## 2018-04-18 NOTE — Patient Instructions (Signed)
Labs. OV in 1 year.

## 2018-04-19 LAB — CBC WITH DIFFERENTIAL/PLATELET
Absolute Monocytes: 610 cells/uL (ref 200–950)
Basophils Absolute: 38 cells/uL (ref 0–200)
Basophils Relative: 0.7 %
Eosinophils Absolute: 140 cells/uL (ref 15–500)
Eosinophils Relative: 2.6 %
HCT: 39.3 % (ref 35.0–45.0)
Hemoglobin: 14.2 g/dL (ref 11.7–15.5)
Lymphs Abs: 1993 cells/uL (ref 850–3900)
MCH: 35.9 pg — ABNORMAL HIGH (ref 27.0–33.0)
MCHC: 36.1 g/dL — ABNORMAL HIGH (ref 32.0–36.0)
MCV: 99.2 fL (ref 80.0–100.0)
MPV: 12.9 fL — ABNORMAL HIGH (ref 7.5–12.5)
Monocytes Relative: 11.3 %
Neutro Abs: 2619 cells/uL (ref 1500–7800)
Neutrophils Relative %: 48.5 %
Platelets: 166 10*3/uL (ref 140–400)
RBC: 3.96 10*6/uL (ref 3.80–5.10)
RDW: 13.6 % (ref 11.0–15.0)
Total Lymphocyte: 36.9 %
WBC: 5.4 10*3/uL (ref 3.8–10.8)

## 2018-04-19 LAB — C-REACTIVE PROTEIN: CRP: 2.4 mg/L (ref <8.0)

## 2018-04-25 ENCOUNTER — Other Ambulatory Visit: Payer: Medicare Other | Admitting: Adult Health

## 2018-05-03 DIAGNOSIS — J449 Chronic obstructive pulmonary disease, unspecified: Secondary | ICD-10-CM | POA: Diagnosis not present

## 2018-05-03 DIAGNOSIS — M545 Low back pain: Secondary | ICD-10-CM | POA: Diagnosis not present

## 2018-05-03 DIAGNOSIS — K5090 Crohn's disease, unspecified, without complications: Secondary | ICD-10-CM | POA: Diagnosis not present

## 2018-05-24 ENCOUNTER — Other Ambulatory Visit: Payer: Medicare Other | Admitting: Adult Health

## 2018-06-06 ENCOUNTER — Other Ambulatory Visit: Payer: Medicare Other | Admitting: Adult Health

## 2018-06-17 ENCOUNTER — Other Ambulatory Visit (INDEPENDENT_AMBULATORY_CARE_PROVIDER_SITE_OTHER): Payer: Self-pay | Admitting: Internal Medicine

## 2018-06-17 DIAGNOSIS — K5000 Crohn's disease of small intestine without complications: Secondary | ICD-10-CM

## 2018-06-26 ENCOUNTER — Other Ambulatory Visit: Payer: Self-pay | Admitting: Adult Health

## 2018-07-10 ENCOUNTER — Other Ambulatory Visit: Payer: Medicare Other | Admitting: Adult Health

## 2018-08-02 DIAGNOSIS — K5090 Crohn's disease, unspecified, without complications: Secondary | ICD-10-CM | POA: Diagnosis not present

## 2018-08-02 DIAGNOSIS — J449 Chronic obstructive pulmonary disease, unspecified: Secondary | ICD-10-CM | POA: Diagnosis not present

## 2018-08-07 ENCOUNTER — Other Ambulatory Visit: Payer: Medicare Other | Admitting: Adult Health

## 2018-08-23 ENCOUNTER — Other Ambulatory Visit (HOSPITAL_COMMUNITY): Payer: Self-pay | Admitting: Pulmonary Disease

## 2018-08-23 DIAGNOSIS — Z780 Asymptomatic menopausal state: Secondary | ICD-10-CM

## 2018-08-29 ENCOUNTER — Other Ambulatory Visit (INDEPENDENT_AMBULATORY_CARE_PROVIDER_SITE_OTHER): Payer: Self-pay | Admitting: Internal Medicine

## 2018-08-29 DIAGNOSIS — K219 Gastro-esophageal reflux disease without esophagitis: Secondary | ICD-10-CM

## 2018-09-12 ENCOUNTER — Ambulatory Visit (HOSPITAL_COMMUNITY): Payer: Medicare Other

## 2018-09-20 ENCOUNTER — Inpatient Hospital Stay (HOSPITAL_COMMUNITY): Admission: RE | Admit: 2018-09-20 | Payer: Medicare Other | Source: Ambulatory Visit

## 2018-09-29 ENCOUNTER — Telehealth: Payer: Self-pay | Admitting: *Deleted

## 2018-09-29 NOTE — Telephone Encounter (Signed)
Patient informed we are still not allowing any visitors or children to come in during appointment time unless physical assistance is needed. Asked if has had any exposure to anyone suspected or confirmed of having COVID-19 or if she was experiencing any of the following, to reschedule: fever, cough, shortness of breath, muscle pain, diarrhea, Giles Currie, vomiting, abdominal pain, red eye, weakness, bruising, bleeding, joint pain, or a severe headache.  Stated no to all.  Asked that she complete E-check-in via mychart prior to arrival.  Advised to check-in via Hello Patient and call our office on arrival in our office parking lot to complete registration over the phone. Advised to also use the provided hand sanitizer when entering the office and to wear a mask if she has one, if not, we will provide one. Pt verbalized understanding.     

## 2018-10-02 ENCOUNTER — Other Ambulatory Visit: Payer: Medicare Other | Admitting: Adult Health

## 2018-10-16 ENCOUNTER — Telehealth: Payer: Self-pay | Admitting: Adult Health

## 2018-10-16 NOTE — Telephone Encounter (Signed)
Patient informed we are still not allowing any visitors or children to come in during appointment time unless physical assistance is needed. Asked if has had any exposure to anyone suspected or confirmed of having COVID-19 or if she was experiencing any of the following, to reschedule: fever, cough, shortness of breath, muscle pain, diarrhea, rash, vomiting, abdominal pain, red eye, weakness, bruising, bleeding, joint pain, or a severe headache.  Stated no to all.  Asked that she complete E-check-in via mychart prior to arrival.  Advised to check-in via Hello Patient and either call our office on arrival in our office parking lot to complete registration over the phone or come in but will then need to wait in the car until the nurse calls for her.  Advised to also use the provided hand sanitizer when entering the office and to wear a mask if she has one, if not, we will provide one. Pt verbalized understanding.

## 2018-10-17 ENCOUNTER — Encounter: Payer: Self-pay | Admitting: Adult Health

## 2018-10-17 ENCOUNTER — Other Ambulatory Visit: Payer: Self-pay

## 2018-10-17 ENCOUNTER — Ambulatory Visit (INDEPENDENT_AMBULATORY_CARE_PROVIDER_SITE_OTHER): Payer: Medicare Other | Admitting: Adult Health

## 2018-10-17 VITALS — BP 109/71 | HR 76 | Ht 62.0 in | Wt 125.8 lb

## 2018-10-17 DIAGNOSIS — Z01419 Encounter for gynecological examination (general) (routine) without abnormal findings: Secondary | ICD-10-CM | POA: Diagnosis not present

## 2018-10-17 DIAGNOSIS — Z1212 Encounter for screening for malignant neoplasm of rectum: Secondary | ICD-10-CM | POA: Diagnosis not present

## 2018-10-17 DIAGNOSIS — Z1211 Encounter for screening for malignant neoplasm of colon: Secondary | ICD-10-CM

## 2018-10-17 LAB — HEMOCCULT GUIAC POC 1CARD (OFFICE): Fecal Occult Blood, POC: NEGATIVE

## 2018-10-17 NOTE — Progress Notes (Signed)
Patient ID: [name], female   DOB: 05-07-1960, 58 y.o.   MRN: [id number] History of Present Illness: [name] is a 58 year old white female, divorced, sp hysterectomy.  PCP is Dr Luan Pulling.   Current Medications, Allergies, Past Medical History, Past Surgical History, Family History and Social History were reviewed in [employer] record.     Review of Systems: Patient denies any headaches, hearing loss, fatigue, blurred vision, shortness of breath, chest pain, abdominal pain, problems with bowel movements, urination, or intercourse(not sexually active). No joint pain or mood swings. Has noticed metabolism is slower.    Physical Exam:BP 109/71 (BP Location: Right Arm, Patient Position: Sitting, Cuff Size: Normal)   Pulse 76   Ht 5' 2"  (1.575 m)   Wt 125 lb 12.8 oz (57.1 kg)   BMI 23.01 kg/m  General:  Well developed, well nourished, no acute distress Skin:  Warm and dry Neck:  Midline trachea, normal thyroid, good ROM, no lymphadenopathy Lungs; Clear to auscultation bilaterally Breast:  No dominant palpable mass, retraction, or nipple discharge Cardiovascular: Regular rate and rhythm Abdomen:  Soft, non tender, no hepatosplenomegaly Pelvic:  External genitalia is normal in appearance, no lesions.  The vagina is pale with loss of moisture and rugae. Urethra has no lesions or masses. The cervix and uterus are absent.  No adnexal masses or tenderness noted.Bladder is non tender, no masses felt. Rectal: Good sphincter tone, no polyps, or hemorrhoids felt.  Hemoccult negative. Extremities/musculoskeletal:  No swelling or varicosities noted, no clubbing or cyanosis Psych:  No mood changes, alert and cooperative,seems happy Fall risk is low. PHQ 9 score is 5, denies being suicidal. Examination chaperoned by Diona Fanti CMA.   Impression: 1. Encounter for well woman exam with routine gynecological exam   2. Screening for colorectal cancer       Plan: Physical in  1 year Labs with PCP Mammogram yearly Get DEXA Colonoscopy per GI

## 2018-10-27 ENCOUNTER — Other Ambulatory Visit: Payer: Self-pay

## 2018-10-27 ENCOUNTER — Ambulatory Visit (HOSPITAL_COMMUNITY)
Admission: RE | Admit: 2018-10-27 | Discharge: 2018-10-27 | Disposition: A | Discharge status: Home / Self Care | Payer: Medicare Other | Source: Ambulatory Visit | Attending: Pulmonary Disease | Admitting: Pulmonary Disease

## 2018-10-27 DIAGNOSIS — M8589 Other specified disorders of bone density and structure, multiple sites: Secondary | ICD-10-CM | POA: Diagnosis not present

## 2018-10-27 DIAGNOSIS — Z780 Asymptomatic menopausal state: Secondary | ICD-10-CM | POA: Insufficient documentation

## 2018-11-01 DIAGNOSIS — K5090 Crohn's disease, unspecified, without complications: Secondary | ICD-10-CM | POA: Diagnosis not present

## 2018-11-01 DIAGNOSIS — M545 Low back pain: Secondary | ICD-10-CM | POA: Diagnosis not present

## 2018-11-01 DIAGNOSIS — Z79891 Long term (current) use of opiate analgesic: Secondary | ICD-10-CM | POA: Diagnosis not present

## 2018-11-01 DIAGNOSIS — J449 Chronic obstructive pulmonary disease, unspecified: Secondary | ICD-10-CM | POA: Diagnosis not present

## 2018-12-28 DIAGNOSIS — M25532 Pain in left wrist: Secondary | ICD-10-CM | POA: Diagnosis not present

## 2018-12-28 DIAGNOSIS — M545 Low back pain: Secondary | ICD-10-CM | POA: Diagnosis not present

## 2018-12-28 DIAGNOSIS — M129 Arthropathy, unspecified: Secondary | ICD-10-CM | POA: Diagnosis not present

## 2018-12-28 DIAGNOSIS — G8929 Other chronic pain: Secondary | ICD-10-CM | POA: Diagnosis not present

## 2018-12-28 DIAGNOSIS — Z79899 Other long term (current) drug therapy: Secondary | ICD-10-CM | POA: Diagnosis not present

## 2018-12-28 DIAGNOSIS — E7800 Pure hypercholesterolemia, unspecified: Secondary | ICD-10-CM | POA: Diagnosis not present

## 2018-12-28 DIAGNOSIS — Z1159 Encounter for screening for other viral diseases: Secondary | ICD-10-CM | POA: Diagnosis not present

## 2019-01-01 ENCOUNTER — Other Ambulatory Visit (INDEPENDENT_AMBULATORY_CARE_PROVIDER_SITE_OTHER): Payer: Self-pay | Admitting: Nurse Practitioner

## 2019-01-01 ENCOUNTER — Telehealth (INDEPENDENT_AMBULATORY_CARE_PROVIDER_SITE_OTHER): Payer: Self-pay | Admitting: Nurse Practitioner

## 2019-01-01 DIAGNOSIS — K5000 Crohn's disease of small intestine without complications: Secondary | ICD-10-CM

## 2019-01-01 MED ORDER — MERCAPTOPURINE 50 MG PO TABS: ORAL_TABLET | ORAL | 1 refills | Status: DC

## 2019-01-01 NOTE — Telephone Encounter (Signed)
I do not have prior refill request. I refilled RX now. Pt will need labs done for further refills. Pls call patient and let her know refill sent and please have her pick up the lab order. thx

## 2019-01-01 NOTE — Telephone Encounter (Signed)
Patient called stated she has been without her Mercaptopurine all weekend - stated [facility] has sent refill request - her ph# [phone]

## 2019-01-09 DIAGNOSIS — M25532 Pain in left wrist: Secondary | ICD-10-CM | POA: Diagnosis not present

## 2019-01-09 DIAGNOSIS — S52532P Colles' fracture of left radius, subsequent encounter for closed fracture with malunion: Secondary | ICD-10-CM | POA: Diagnosis not present

## 2019-01-09 DIAGNOSIS — G5602 Carpal tunnel syndrome, left upper limb: Secondary | ICD-10-CM | POA: Diagnosis not present

## 2019-01-10 DIAGNOSIS — K5000 Crohn's disease of small intestine without complications: Secondary | ICD-10-CM | POA: Diagnosis not present

## 2019-01-11 LAB — COMPLETE METABOLIC PANEL WITH GFR
AG Ratio: 1.3 (calc) (ref 1.0–2.5)
ALT: 22 U/L (ref 6–29)
AST: 34 U/L (ref 10–35)
Albumin: 4 g/dL (ref 3.6–5.1)
Alkaline phosphatase (APISO): 93 U/L (ref 37–153)
BUN: 9 mg/dL (ref 7–25)
CO2: 28 mmol/L (ref 20–32)
Calcium: 9.6 mg/dL (ref 8.6–10.4)
Chloride: 104 mmol/L (ref 98–110)
Creat: 0.76 mg/dL (ref 0.50–1.05)
GFR, Est African American: 100 mL/min/{1.73_m2} (ref >=60)
GFR, Est Non African American: 86 mL/min/{1.73_m2} (ref >=60)
Globulin: 3.1 g/dL (calc) (ref 1.9–3.7)
Glucose, Bld: 101 mg/dL — ABNORMAL HIGH (ref 65–99)
Potassium: 4.5 mmol/L (ref 3.5–5.3)
Sodium: 139 mmol/L (ref 135–146)
Total Bilirubin: 0.5 mg/dL (ref 0.2–1.2)
Total Protein: 7.1 g/dL (ref 6.1–8.1)

## 2019-01-11 LAB — CBC WITH DIFFERENTIAL/PLATELET
Absolute Monocytes: 381 cells/uL (ref 200–950)
Basophils Absolute: 28 cells/uL (ref 0–200)
Basophils Relative: 0.5 %
Eosinophils Absolute: 62 cells/uL (ref 15–500)
Eosinophils Relative: 1.1 %
HCT: 39 % (ref 35.0–45.0)
Hemoglobin: 14.1 g/dL (ref 11.7–15.5)
Lymphs Abs: 1501 cells/uL (ref 850–3900)
MCH: 37 pg — ABNORMAL HIGH (ref 27.0–33.0)
MCHC: 36.2 g/dL — ABNORMAL HIGH (ref 32.0–36.0)
MCV: 102.4 fL — ABNORMAL HIGH (ref 80.0–100.0)
MPV: 13.5 fL — ABNORMAL HIGH (ref 7.5–12.5)
Monocytes Relative: 6.8 %
Neutro Abs: 3629 cells/uL (ref 1500–7800)
Neutrophils Relative %: 64.8 %
Platelets: 130 10*3/uL — ABNORMAL LOW (ref 140–400)
RBC: 3.81 10*6/uL (ref 3.80–5.10)
RDW: 13.4 % (ref 11.0–15.0)
Total Lymphocyte: 26.8 %
WBC: 5.6 10*3/uL (ref 3.8–10.8)

## 2019-01-11 LAB — C-REACTIVE PROTEIN: CRP: 4.8 mg/L (ref <8.0)

## 2019-01-11 LAB — LIPASE: Lipase: <5 U/L — ABNORMAL LOW (ref 7–60)

## 2019-01-30 ENCOUNTER — Other Ambulatory Visit (HOSPITAL_COMMUNITY): Payer: Self-pay | Admitting: Adult Medicine

## 2019-01-30 DIAGNOSIS — Z1231 Encounter for screening mammogram for malignant neoplasm of breast: Secondary | ICD-10-CM

## 2019-02-04 ENCOUNTER — Telehealth (INDEPENDENT_AMBULATORY_CARE_PROVIDER_SITE_OTHER): Payer: Self-pay | Admitting: Nurse Practitioner

## 2019-02-04 NOTE — Telephone Encounter (Signed)
Mitzie, pls call pt, she should have an earlier office visit, she is currently scheduled for an office visit 12/30. Refer to lab result notes. thx

## 2019-02-08 NOTE — Telephone Encounter (Signed)
error 

## 2019-02-28 ENCOUNTER — Other Ambulatory Visit (INDEPENDENT_AMBULATORY_CARE_PROVIDER_SITE_OTHER): Payer: Self-pay | Admitting: Nurse Practitioner

## 2019-02-28 ENCOUNTER — Other Ambulatory Visit (INDEPENDENT_AMBULATORY_CARE_PROVIDER_SITE_OTHER): Payer: Self-pay | Admitting: Internal Medicine

## 2019-02-28 DIAGNOSIS — K5000 Crohn's disease of small intestine without complications: Secondary | ICD-10-CM

## 2019-02-28 DIAGNOSIS — K219 Gastro-esophageal reflux disease without esophagitis: Secondary | ICD-10-CM

## 2019-03-01 ENCOUNTER — Telehealth (INDEPENDENT_AMBULATORY_CARE_PROVIDER_SITE_OTHER): Payer: Self-pay | Admitting: *Deleted

## 2019-03-01 ENCOUNTER — Other Ambulatory Visit (INDEPENDENT_AMBULATORY_CARE_PROVIDER_SITE_OTHER): Payer: Self-pay | Admitting: Nurse Practitioner

## 2019-03-01 DIAGNOSIS — K5000 Crohn's disease of small intestine without complications: Secondary | ICD-10-CM

## 2019-03-01 MED ORDER — MERCAPTOPURINE 50 MG PO TABS: ORAL_TABLET | ORAL | 1 refills | Status: DC

## 2019-03-01 NOTE — Telephone Encounter (Signed)
Patient has called and ask that her 6 MP be refilled and also to put some refills on it. I see that there has been some telephone encounters in September. Would you please address.

## 2019-03-01 NOTE — Telephone Encounter (Signed)
I will refill her medication for enough until she has her office appt. 04/18/2019, she is past due for an office visit

## 2019-03-12 ENCOUNTER — Ambulatory Visit (HOSPITAL_COMMUNITY): Payer: Medicare Other

## 2019-04-04 ENCOUNTER — Ambulatory Visit (HOSPITAL_COMMUNITY): Payer: Medicare Other

## 2019-04-06 ENCOUNTER — Ambulatory Visit (HOSPITAL_COMMUNITY): Payer: Medicare Other

## 2019-04-11 ENCOUNTER — Ambulatory Visit (HOSPITAL_COMMUNITY): Payer: Medicare Other

## 2019-04-16 NOTE — Progress Notes (Deleted)
   Subjective:    Patient ID: [name], female    DOB: 10-Nov-1960, 58 y.o.   MRN: [id number]  HPI [name] is a 58 year old female with a past medical history of arthritis, COPD, GERD and Crohn's disease. Past distal ileal resection and  cholecystectomy. She presents today for Crohn's follow up. She is on Mercaptopurine 24m once daily.  Her most recent laboratory studies were done 01/10/2019 which identified a WBC 5.6.  Hemoglobin 14.1.  Hematocrit 39.0.  Platelet 130.  Glucose 101.  BUN 9.  Creatinine 0.76.  Sodium 139.  Potassium 4.5.  Calcium 9.6.  Total bili 0.5.  Alk phos 93.  AST 34.  ALT 22.  CRP 4.8.  Her most recent colonoscopy was 04/02/2016 which identified a few erosions to the distal ileum and diverticulosis to the ascending and sigmoid colon.    Colonoscopy 04/02/2016 by Dr. RLaural Golden - Diminishedsphincter tone found on perianal exam. - A few erosions in the distal ileum secondary to Crohn's disease. - Diverticulosis in the ascending colon. - Diverticulosis in the sigmoid colon. - The distal rectum and anal verge are normal on retroflexion view. - No specimens collected. - Repeat colonoscopy in 10 years recommended.   Abdominal sonogram 01/22/2015: Prior cholecystectomy. Suspect slight fatty infiltration of the liver. Intrahepatic and extrahepatic biliary ductal dilatation is stable since prior CT.   Past Medical History:  Diagnosis Date  . Anorexia 10/01/2010  . Arthritis   . Chronic pain syndrome    bones and stomach  . [place]   . Crohn's 10/01/2010  . Diarrhea 10/01/2010  . Emesis 10/01/2010  . GERD (gastroesophageal reflux disease)   . Lower abdominal pain 10/01/2010  . Rectal bleeding 10/01/2010   Past Surgical History:  Procedure Laterality Date  .  Partial Hysterectomy  about 20 years ago  . CHOLECYSTECTOMY    . COLONOSCOPY  01/2006   October 2007 revealinga ileal ulcers proximal to anastomosis without  stricture and a single rectal ulcer.  . COLONOSCOPY WITH PROPOFOL N/A 04/02/2016   Procedure: COLONOSCOPY WITH PROPOFOL;  Surgeon: NRogene Houston MD;  [facility];  Service: Endoscopy;  Laterality: N/A;  1:00  . ESOPHAGOGASTRODUODENOSCOPY (EGD) WITH PROPOFOL N/A 11/21/2013   Procedure: ESOPHAGOGASTRODUODENOSCOPY (EGD) WITH PROPOFOL;  Surgeon: NRogene Houston MD;  Location: [facility];  Service: Endoscopy;  Laterality: N/A;  . TOOTH EXTRACTION Left 10/2013   left lower molar      Review of Systems     Objective:   Physical Exam        Assessment & Plan:   1. Small bowel Crohn's disease   2. ? Hepatic steatosis  3. Thrombocytopenia  -Repeat CBC -Abdominal sonogram to assess the liver and spleen, rule out cirrhosis

## 2019-04-18 ENCOUNTER — Other Ambulatory Visit: Payer: Self-pay

## 2019-04-18 ENCOUNTER — Ambulatory Visit (HOSPITAL_COMMUNITY)
Admission: RE | Admit: 2019-04-18 | Discharge: 2019-04-18 | Disposition: A | Discharge status: Home / Self Care | Payer: Medicare Other | Source: Ambulatory Visit | Attending: Adult Medicine | Admitting: Adult Medicine

## 2019-04-18 ENCOUNTER — Ambulatory Visit (INDEPENDENT_AMBULATORY_CARE_PROVIDER_SITE_OTHER): Payer: Medicare Other | Admitting: Nurse Practitioner

## 2019-04-18 DIAGNOSIS — Z1231 Encounter for screening mammogram for malignant neoplasm of breast: Secondary | ICD-10-CM | POA: Insufficient documentation

## 2019-04-30 ENCOUNTER — Other Ambulatory Visit (INDEPENDENT_AMBULATORY_CARE_PROVIDER_SITE_OTHER): Payer: Self-pay | Admitting: Nurse Practitioner

## 2019-04-30 ENCOUNTER — Other Ambulatory Visit (INDEPENDENT_AMBULATORY_CARE_PROVIDER_SITE_OTHER): Payer: Self-pay | Admitting: Internal Medicine

## 2019-04-30 DIAGNOSIS — K219 Gastro-esophageal reflux disease without esophagitis: Secondary | ICD-10-CM

## 2019-04-30 DIAGNOSIS — K5000 Crohn's disease of small intestine without complications: Secondary | ICD-10-CM

## 2019-04-30 MED ORDER — PANTOPRAZOLE SODIUM 40 MG PO TBEC: 40.0000 mg | DELAYED_RELEASE_TABLET | Freq: Every day | ORAL | 0 refills | Status: DC

## 2019-04-30 NOTE — Telephone Encounter (Signed)
She canceled her last office visit with Jaclyn Shaggy, she is overdue for follow-up.  I will refill  6-MP for another month but please remind her she needs a follow-up appointment, 6-MP is a medication that requires routine labs and f/up monitoring.

## 2019-05-24 ENCOUNTER — Encounter (INDEPENDENT_AMBULATORY_CARE_PROVIDER_SITE_OTHER): Payer: Self-pay | Admitting: Gastroenterology

## 2019-05-24 ENCOUNTER — Other Ambulatory Visit: Payer: Self-pay

## 2019-05-24 ENCOUNTER — Ambulatory Visit (INDEPENDENT_AMBULATORY_CARE_PROVIDER_SITE_OTHER): Payer: Medicare Other | Admitting: Gastroenterology

## 2019-05-24 VITALS — BP 126/81 | HR 77 | Temp 97.6°F | Ht 62.5 in | Wt 131.4 lb

## 2019-05-24 DIAGNOSIS — K219 Gastro-esophageal reflux disease without esophagitis: Secondary | ICD-10-CM

## 2019-05-24 DIAGNOSIS — D696 Thrombocytopenia, unspecified: Secondary | ICD-10-CM | POA: Diagnosis not present

## 2019-05-24 DIAGNOSIS — R300 Dysuria: Secondary | ICD-10-CM | POA: Diagnosis not present

## 2019-05-24 DIAGNOSIS — K5000 Crohn's disease of small intestine without complications: Secondary | ICD-10-CM

## 2019-05-24 DIAGNOSIS — K5090 Crohn's disease, unspecified, without complications: Secondary | ICD-10-CM | POA: Diagnosis not present

## 2019-05-24 MED ORDER — MERCAPTOPURINE 50 MG PO TABS: ORAL_TABLET | ORAL | 6 refills | Status: DC

## 2019-05-24 NOTE — Patient Instructions (Addendum)
Try pepcid in evening, if still having symptoms in a few weeks please call and can increase protonix to twice a day    We are checking labs and urine today  Keep a food log of diet triggers of bad days - please let me know if you need a medication for this  GERD instructions: -Please avoid lying flat within 2 to 3 hours of eating, this will make reflux symptoms worse. -Some patients find elevating the head of the bed beneficial. -Avoid spicy greasy foods as well as caffeine, coffee, sodas-these food/drinks can worsen heartburn and reflux. -Tobacco will worsen reflux, please try to decrease/eliminate tobacco intake if applicable. -Avoid NSAID products (ibuprofen, aspirin, Advil, Aleve, Goody's, BCs, Alka-Seltzer) - if needing these occasionally please try to take with meal or snack to decrease stomach irritation. -If taking medication for reflux such as prilosec, nexium, aciphex, dexilant, prevacid - take 20-30 minutes before a meal for maximum effectiveness.

## 2019-05-24 NOTE — Progress Notes (Addendum)
Patient profile: [name] is a 59 y.o. female seen for follow-up of Crohn's disease. Last seen in clinic in 2019   History of Present Illness: [name] is seen today for for follow-up of Crohn's disease.  She has been on a stable dose of 5m mercaptopurine for many years.  History of small bowel Crohn's with distal ileal resection in the past.  She reports current symptoms are at her baseline.  She reports on a good day she typically has 1 bowel movement a day, she does have "bad days" with 5-6 bowel movements a day some nausea and abdominal pain.  This is on average occurring 1-2 times a month.  Has not found any clear food triggers except red meat which she has avoided completely and still has symptoms. She denies any rectal bleeding or blood in stool.  She does feel her indigestion and reflux are worse over the past 6 to 7 months.  She is on PPI once a day.  Has noted increased stress and feels this may play a role.  Using Tums PRN in addition to pantoprazole [id number]. She denies a lot of coffee sodas, she does not use NSAIDs, no alcohol or tobacco.  She does not have dysphagia.  Rare epigastric pain.  She is using Tums at night which helps symptoms.  Her weight is up as below.   Wt Readings from Last 3 Encounters:  05/24/19 131 lb 6.4 oz (59.6 kg)  10/17/18 125 lb 12.8 oz (57.1 kg)  04/18/18 124 lb (56.2 kg)     Last Colonoscopy: Colonoscopy 2017-diminished sphincter tone on digital exam, erosions distal ileum secondary to Crohn's, diverticulosis   Last Endoscopy: 2015-Impression: Small sliding hiatal hernia but no changes of reflux esophagitis. Pyloric channel inflammation but no evidence of peptic ulcer disease or gastroduodenal Crohn's disease.    Past Medical History:  Past Medical History:  Diagnosis Date  . Anorexia 10/01/2010  . Arthritis   . Chronic pain syndrome    bones and stomach  . COPD [place] disease[place]  . Crohn's  10/01/2010  . Diarrhea 10/01/2010  . Emesis 10/01/2010  . GERD (gastroesophageal reflux disease)   . Lower abdominal pain 10/01/2010  . Rectal bleeding 10/01/2010    Problem List: Patient Active Problem List   Diagnosis Date Noted  . Screening for colorectal cancer 10/17/2018  . Encounter for well woman exam with routine gynecological exam 10/17/2018  . Special screening for malignant neoplasms, colon 02/26/2016  . UTI (urinary tract infection) 08/16/2013  . Splenomegaly 11/07/2012  . SMOKER 03/24/2006  . CARPAL TUNNEL SYNDROME 03/24/2006  . GERD 03/24/2006  . HIATAL HERNIA 03/24/2006  . CROHN'S DISEASE 03/24/2006  . IBS 03/24/2006  . ARTHRITIS 03/24/2006  . LOW BACK PAIN 03/24/2006  . OSTEOPENIA 03/24/2006  . PANCREATITIS, HX OF 03/24/2006    Past Surgical History: Past Surgical History:  Procedure Laterality Date  .  Partial Hysterectomy  about 20 years ago  . CHOLECYSTECTOMY    . COLONOSCOPY  01/2006   October 2007 revealinga ileal ulcers proximal to anastomosis without stricture and a single rectal ulcer.  . COLONOSCOPY WITH PROPOFOL N/A 04/02/2016   Procedure: COLONOSCOPY WITH PROPOFOL;  Surgeon: NaRogene HoustonMD;  [facility];  Service: Endoscopy;  Laterality: N/A;  1:00  . ESOPHAGOGASTRODUODENOSCOPY (EGD) WITH PROPOFOL N/A 11/21/2013   Procedure: ESOPHAGOGASTRODUODENOSCOPY (EGD) WITH PROPOFOL;  Surgeon: NaRogene HoustonMD;  Location: [facility];  Service: Endoscopy;  Laterality: N/A;  . TOOTH EXTRACTION Left 10/2013   left lower molar    Allergies: No Known Allergies    Home Medications:  Current Outpatient Medications:  .  ALPRAZolam (XANAX) 0.5 MG tablet, Take 0.5 mg by mouth at bedtime as needed for sleep. , Disp: , Rfl:  .  Calcium Carbonate-Vitamin D (CALCIUM 500 + D PO), Take by mouth daily., Disp: , Rfl:  .  GELATIN PO, Take 1,300 mg by mouth daily. , Disp: , Rfl:  .  mercaptopurine (PURINETHOL) 50 MG tablet, TAKE 1 TABLET BY MOUTHONCE DAILY;  TAKE 1 HOUR BEFORE OR 2 HOURS AFTER MEALS., Disp: 30 tablet, Rfl: 6 .  Multiple Vitamin (MULTIVITAMIN WITH MINERALS) TABS tablet, Take 1 tablet by mouth daily., Disp: , Rfl:  .  oxyCODONE (OXYCONTIN) 40 MG 12 hr tablet, Take 40-120 mg by mouth 3 (three) times daily as needed for pain (depends on pain level if takes 40 mg -120 mg). , Disp: , Rfl:  .  pantoprazole (PROTONIX) 40 MG tablet, TAKE ONE TABLET BY MOUTH ONCE DAILY., Disp: 30 tablet, Rfl: 0   Family History: family history includes Cancer in her maternal grandmother; Dementia in her maternal grandmother and mother; Diabetes in her sister; Healthy in her daughter and son; Heart attack in her father and mother; Heart disease in her father.    Social History:   reports that she quit smoking about 3 years ago. Her smoking use included cigarettes. She has a 8.75 pack-year smoking history. She has never used smokeless tobacco. She reports that she does not drink alcohol or use drugs.   Review of Systems: Constitutional: Denies weight loss/weight gain  Eyes: No changes in vision. ENT: No oral lesions, sore throat.  GI: see HPI.  Heme/Lymph: No easy bruising.  CV: No chest pain.  GU: No hematuria.  Integumentary: No rashes.  Neuro: No headaches.  Psych: No depression/anxiety.  Endocrine: No heat/cold intolerance.  Allergic/Immunologic: No urticaria.  Resp: No cough, SOB.  Musculoskeletal: No joint swelling.    Physical Examination: BP 126/81 (BP Location: Right Arm, Patient Position: Sitting, Cuff Size: Large)   Pulse 77   Temp 97.6 F (36.4 C) (Temporal)   Ht 5' 2.5" (1.588 m)   Wt 131 lb 6.4 oz (59.6 kg)   BMI 23.65 kg/m  Gen: NAD, alert and oriented x 4 HEENT: PEERLA, EOMI, Neck: supple, no JVD Chest: CTA bilaterally, no wheezes, crackles, or other adventitious sounds CV: RRR, no m/g/c/r Abd: soft, NT, ND, +BS in all four quadrants; no HSM, guarding, ridigity, or rebound tenderness Ext: no edema, well perfused with 2+  pulses, Skin: no rash or lesions noted on observed skin Lymph: no noted LAD  Data Reviewed:  Labs 12/2018--- MCV 102, platelets 130, white blood count and hemoglobin normal, LFTs normal, lipase less than 23 March 2018 platelets 166  Assessment/Plan: Ms. Benjamin is a 59 y.o. female   1.  Crohn's disease-we will refill 6-MP. She did have a slight drop in her platelet count on last labs in September 2020, we will repeat today.  Otherwise her labs are unremarkable.  Her CRP was normal.  Symptomatically she is at baseline with usually good control Crohn's symptoms but does have some occasional "bad days" offered antispasmodic to use as needed but she does not feel she needs this.  She is going to look for diet triggers which I agree with.  To notify me if occurring often.  2.  Dysuria-x3 days with bladder feeling  full, frequency, pain.  Requesting UA & Urine culture. She is hydrating w/ plently of fluids and trying cranberry juice at home   3.  GERD-she is symptomatic in the evenings after pantoprazole 40 mg in the morning.  We discussed adding a dose of Pepcid in the evening.  She would like to buy over-the-counter instead of me sending the pharmacy.  If this does not improve would consider PPI twice daily but prefer to avoid this long-term.  She does not eat late meals.  We discussed other diet modifications. Takes Calc+Vit D daily    Taiyana was seen today for follow-up.  Diagnoses and all orders for this visit:  Thrombocytopenia ([place]) -     CBC with Differential  Crohn's disease without complication, unspecified gastrointestinal tract location ([place]) -     CBC with Differential -     COMPLETE METABOLIC PANEL WITH GFR  Chronic GERD  Dysuria -     Urinalysis, Routine w reflex microscopic -     Urine Culture  Crohn's disease of small intestine without complication (HCC) -     mercaptopurine (PURINETHOL) 50 MG tablet; TAKE 1 TABLET BY MOUTHONCE DAILY; TAKE 1 HOUR BEFORE OR 2 HOURS  AFTER MEALS.      I personally performed the service, non-incident to. (WP)  Laurine Blazer, [facility]

## 2019-05-25 LAB — CBC WITH DIFFERENTIAL/PLATELET
Absolute Monocytes: 475 cells/uL (ref 200–950)
Basophils Absolute: 33 cells/uL (ref 0–200)
Basophils Relative: 0.5 %
Eosinophils Absolute: 73 cells/uL (ref 15–500)
Eosinophils Relative: 1.1 %
HCT: 38.1 % (ref 35.0–45.0)
Hemoglobin: 13.3 g/dL (ref 11.7–15.5)
Lymphs Abs: 1373 cells/uL (ref 850–3900)
MCH: 34.6 pg — ABNORMAL HIGH (ref 27.0–33.0)
MCHC: 34.9 g/dL (ref 32.0–36.0)
MCV: 99.2 fL (ref 80.0–100.0)
MPV: 12.3 fL (ref 7.5–12.5)
Monocytes Relative: 7.2 %
Neutro Abs: 4646 cells/uL (ref 1500–7800)
Neutrophils Relative %: 70.4 %
Platelets: 137 10*3/uL — ABNORMAL LOW (ref 140–400)
RBC: 3.84 10*6/uL (ref 3.80–5.10)
RDW: 13.8 % (ref 11.0–15.0)
Total Lymphocyte: 20.8 %
WBC: 6.6 10*3/uL (ref 3.8–10.8)

## 2019-05-25 LAB — COMPLETE METABOLIC PANEL WITH GFR
AG Ratio: 1.5 (calc) (ref 1.0–2.5)
ALT: 36 U/L — ABNORMAL HIGH (ref 6–29)
AST: 49 U/L — ABNORMAL HIGH (ref 10–35)
Albumin: 4.1 g/dL (ref 3.6–5.1)
Alkaline phosphatase (APISO): 92 U/L (ref 37–153)
BUN: 13 mg/dL (ref 7–25)
CO2: 26 mmol/L (ref 20–32)
Calcium: 9.6 mg/dL (ref 8.6–10.4)
Chloride: 104 mmol/L (ref 98–110)
Creat: 0.8 mg/dL (ref 0.50–0.99)
GFR, Est African American: 93 mL/min/{1.73_m2} (ref >=60)
GFR, Est Non African American: 80 mL/min/{1.73_m2} (ref >=60)
Globulin: 2.8 g/dL (calc) (ref 1.9–3.7)
Glucose, Bld: 95 mg/dL (ref 65–139)
Potassium: 4.2 mmol/L (ref 3.5–5.3)
Sodium: 139 mmol/L (ref 135–146)
Total Bilirubin: 0.6 mg/dL (ref 0.2–1.2)
Total Protein: 6.9 g/dL (ref 6.1–8.1)

## 2019-05-25 LAB — URINALYSIS, ROUTINE W REFLEX MICROSCOPIC
Bacteria, UA: NONE SEEN /HPF
Bilirubin Urine: NEGATIVE
Glucose, UA: NEGATIVE
Hyaline Cast: NONE SEEN /LPF
Nitrite: NEGATIVE
RBC / HPF: >=60 /HPF — AB (ref 0–2)
Specific Gravity, Urine: 1.024 (ref 1.001–1.03)
pH: 6 (ref 5.0–8.0)

## 2019-05-25 LAB — URINE CULTURE
MICRO NUMBER:: 10117989
Result:: NO GROWTH
SPECIMEN QUALITY:: ADEQUATE

## 2019-05-27 ENCOUNTER — Other Ambulatory Visit (INDEPENDENT_AMBULATORY_CARE_PROVIDER_SITE_OTHER): Payer: Self-pay | Admitting: Internal Medicine

## 2019-05-27 MED ORDER — CIPROFLOXACIN HCL 500 MG PO TABS: 500.0000 mg | ORAL_TABLET | Freq: Two times a day (BID) | ORAL | 0 refills | Status: DC

## 2019-05-28 ENCOUNTER — Telehealth (INDEPENDENT_AMBULATORY_CARE_PROVIDER_SITE_OTHER): Payer: Self-pay | Admitting: Gastroenterology

## 2019-05-28 ENCOUNTER — Other Ambulatory Visit (INDEPENDENT_AMBULATORY_CARE_PROVIDER_SITE_OTHER): Payer: Self-pay | Admitting: Gastroenterology

## 2019-05-28 DIAGNOSIS — R8290 Unspecified abnormal findings in urine: Secondary | ICD-10-CM

## 2019-05-28 DIAGNOSIS — R7989 Other specified abnormal findings of blood chemistry: Secondary | ICD-10-CM

## 2019-05-28 MED ORDER — SULFAMETHOXAZOLE-TRIMETHOPRIM 800-160 MG PO TABS: 1.0000 | ORAL_TABLET | Freq: Two times a day (BID) | ORAL | 0 refills | Status: DC

## 2019-05-28 NOTE — Telephone Encounter (Signed)
[place] sch'd 06/06/19 at 830 (815), npo after midnight, left detailed message for patient

## 2019-05-28 NOTE — Addendum Note (Signed)
Addended by: Laurine Blazer A on: 05/28/2019 08:30 AM   Modules accepted: Orders

## 2019-05-28 NOTE — Progress Notes (Signed)
Bactrim sent to pharmacy for UTI treatment.

## 2019-05-28 NOTE — Progress Notes (Signed)
Addendum-Dr. Laural Golden sent Cipro for patient yesterday.  Prescription for Bactrim canceled

## 2019-05-28 NOTE — Telephone Encounter (Signed)
Discussed with Dr. Laural Golden. Called pt w/ updated recommendations, recommend repeat urinalysis and urine culture in 2-3 weeks after finishing antibiotics.   Tammy - I will be on maternity leave in 2-3 weeks, can order UA and urine culture for Dr Laural Golden to review with diagnosis code: dysuria, abnormal urinalysis.  Patient aware to call sooner if she is not improving. thanks!   Mitize -she also states she has had labs with [facility] - Lahoma Rocker, MD - and asks we get a copy of these, I do not see in Epic. Thanks.

## 2019-05-28 NOTE — Telephone Encounter (Signed)
[name]-patient needs right upper quadrant ultrasound for evaluation of minimally elevated LFTs.  I have left this information on her voicemail. Order in chart to schedule

## 2019-05-30 ENCOUNTER — Other Ambulatory Visit (INDEPENDENT_AMBULATORY_CARE_PROVIDER_SITE_OTHER): Payer: Self-pay | Admitting: *Deleted

## 2019-05-30 DIAGNOSIS — R8290 Unspecified abnormal findings in urine: Secondary | ICD-10-CM

## 2019-05-30 DIAGNOSIS — R300 Dysuria: Secondary | ICD-10-CM

## 2019-05-30 NOTE — Telephone Encounter (Signed)
A 3 week U/A and U/A culture ordered to be done 06/20/2019.

## 2019-06-06 ENCOUNTER — Ambulatory Visit (HOSPITAL_COMMUNITY): Payer: Medicare Other

## 2019-06-13 ENCOUNTER — Ambulatory Visit (HOSPITAL_COMMUNITY): Payer: Medicare Other

## 2019-06-13 ENCOUNTER — Other Ambulatory Visit (INDEPENDENT_AMBULATORY_CARE_PROVIDER_SITE_OTHER): Payer: Self-pay | Admitting: *Deleted

## 2019-06-13 DIAGNOSIS — R300 Dysuria: Secondary | ICD-10-CM

## 2019-06-13 DIAGNOSIS — R8290 Unspecified abnormal findings in urine: Secondary | ICD-10-CM

## 2019-06-19 ENCOUNTER — Ambulatory Visit (HOSPITAL_COMMUNITY): Payer: Medicare Other

## 2019-06-22 LAB — URINALYSIS, ROUTINE W REFLEX MICROSCOPIC
Bacteria, UA: NONE SEEN /HPF
Bilirubin Urine: NEGATIVE
Glucose, UA: NEGATIVE
Hyaline Cast: NONE SEEN /LPF
Nitrite: NEGATIVE
Protein, ur: NEGATIVE
Specific Gravity, Urine: 1.02 (ref 1.001–1.03)
pH: 7 (ref 5.0–8.0)

## 2019-06-22 LAB — URINE CULTURE
MICRO NUMBER:: 10212765
Result:: NO GROWTH
SPECIMEN QUALITY:: ADEQUATE

## 2019-06-25 ENCOUNTER — Ambulatory Visit (HOSPITAL_COMMUNITY): Admission: RE | Admit: 2019-06-25 | Payer: Medicare Other | Source: Ambulatory Visit

## 2019-06-29 ENCOUNTER — Other Ambulatory Visit (INDEPENDENT_AMBULATORY_CARE_PROVIDER_SITE_OTHER): Payer: Self-pay | Admitting: Gastroenterology

## 2019-06-29 DIAGNOSIS — K219 Gastro-esophageal reflux disease without esophagitis: Secondary | ICD-10-CM

## 2019-07-02 ENCOUNTER — Telehealth (INDEPENDENT_AMBULATORY_CARE_PROVIDER_SITE_OTHER): Payer: Self-pay | Admitting: Internal Medicine

## 2019-07-02 NOTE — Telephone Encounter (Signed)
Patient called . It was for a refill on her Pantoprazole 40 mg . This was sent in to her pharmacy on 06/29/2019. Patient states that she has picked it up.

## 2019-07-02 NOTE — Telephone Encounter (Signed)
Patient left message stating she needs a refill - did not specify what medication - stated pharmacy had faxed it over - please advise

## 2019-08-01 ENCOUNTER — Other Ambulatory Visit (INDEPENDENT_AMBULATORY_CARE_PROVIDER_SITE_OTHER): Payer: Self-pay | Admitting: Internal Medicine

## 2019-08-01 DIAGNOSIS — K219 Gastro-esophageal reflux disease without esophagitis: Secondary | ICD-10-CM

## 2019-11-21 ENCOUNTER — Ambulatory Visit (INDEPENDENT_AMBULATORY_CARE_PROVIDER_SITE_OTHER): Payer: Medicare Other | Admitting: Gastroenterology

## 2019-12-31 ENCOUNTER — Other Ambulatory Visit (INDEPENDENT_AMBULATORY_CARE_PROVIDER_SITE_OTHER): Payer: Self-pay | Admitting: Gastroenterology

## 2019-12-31 DIAGNOSIS — K219 Gastro-esophageal reflux disease without esophagitis: Secondary | ICD-10-CM

## 2019-12-31 DIAGNOSIS — K5000 Crohn's disease of small intestine without complications: Secondary | ICD-10-CM

## 2020-01-07 ENCOUNTER — Ambulatory Visit (INDEPENDENT_AMBULATORY_CARE_PROVIDER_SITE_OTHER): Payer: Medicare Other | Admitting: Gastroenterology

## 2020-02-07 ENCOUNTER — Encounter (INDEPENDENT_AMBULATORY_CARE_PROVIDER_SITE_OTHER): Payer: Self-pay | Admitting: Gastroenterology

## 2020-02-07 ENCOUNTER — Other Ambulatory Visit: Payer: Self-pay

## 2020-02-07 ENCOUNTER — Ambulatory Visit (INDEPENDENT_AMBULATORY_CARE_PROVIDER_SITE_OTHER): Payer: Medicare Other | Admitting: Gastroenterology

## 2020-02-07 VITALS — BP 155/68 | HR 73 | Temp 98.1°F | Ht 62.5 in | Wt 129.2 lb

## 2020-02-07 DIAGNOSIS — R300 Dysuria: Secondary | ICD-10-CM | POA: Diagnosis not present

## 2020-02-07 DIAGNOSIS — K5090 Crohn's disease, unspecified, without complications: Secondary | ICD-10-CM | POA: Diagnosis not present

## 2020-02-07 DIAGNOSIS — R197 Diarrhea, unspecified: Secondary | ICD-10-CM

## 2020-02-07 NOTE — Patient Instructions (Signed)
We are checking labs and urinalysis today.  We will call with results.  We are referring you to a urologist for evaluation, please let us know if you do not hear from them about scheduling.

## 2020-02-07 NOTE — Progress Notes (Signed)
Patient profile: [name] is a 59 y.o. female seen for follow-up of Crohn's disease, last seen February 2021.  Following the last visit minimally elevated LFTs were noted and ultrasound was scheduled but has not been completed yet.  She also had a urine culture ordered during her February visit, she was treated with a course of Cipro and had a follow-up urinalysis in March 2021 with trace ketones leukocytes with cloudy urine, trace ketones 2+ leukocytes.  She has recommended follow-up with urology.   History of Present Illness: [name] is seen today for follow-up.  She reports she is unsure if she followed up with urology after her last visit.  She saw a few different specialist in [place] but does not like driving to [place] and requests for local follow-up.  She has continued to struggle with UTIs and most recently had treatment of 3 days of Cipro last week.  She feels that this improved symptoms for short period but then they returned as soon as she finished the Cipro. Her PCP most recently treated UTI.  She reports her bowels on a good day are 2 formed stools that are 4 on the Bristol stool scale.  On a bad day when she is having stress she can have 5-6 liquid stools.  She does feel her flares correlate to having stress.  She denies any significant abdominal pain except the lower pelvic pain related to her UTI.  She denies any blood in stool.  Occasional nausea but no vomiting.  Does have some GERD symptoms despite pantoprazole 40 mg once a day.  Occasionally takes twice a day which helps more than once a day.  No dysphagia.  No longer following medication for rheumatoid arthritis-she tried medication but it caused severe abdominal pain.  She is unsure what medicine this was.  Wt Readings from Last 3 Encounters:  02/07/20 129 lb 3.2 oz (58.6 kg)  05/24/19 131 lb 6.4 oz (59.6 kg)  10/17/18 125 lb 12.8 oz (57.1 kg)    Last Colonoscopy: Colonoscopy 2017-diminished sphincter tone  on digital exam, erosions distal ileum secondary to Crohn's, diverticulosis   Last Endoscopy: 2015-Impression: Small sliding hiatal hernia but no changes of reflux esophagitis. Pyloric channel inflammation but no evidence of peptic ulcer disease or gastroduodenal Crohn's disease.    ast Medical History:  Past Medical History:  Diagnosis Date  . Anorexia 10/01/2010  . Arthritis   . Chronic pain syndrome    bones and stomach  . COPD (chronic obstructive pulmonary disease) ([place])   . Crohn's 10/01/2010  . Diarrhea 10/01/2010  . Emesis 10/01/2010  . GERD (gastroesophageal reflux disease)   . Lower abdominal pain 10/01/2010  . Rectal bleeding 10/01/2010    Problem List: Patient Active Problem List   Diagnosis Date Noted  . Screening for colorectal cancer 10/17/2018  . Encounter for well woman exam with routine gynecological exam 10/17/2018  . Special screening for malignant neoplasms, colon 02/26/2016  . UTI (urinary tract infection) 08/16/2013  . Splenomegaly 11/07/2012  . SMOKER 03/24/2006  . CARPAL TUNNEL SYNDROME 03/24/2006  . GERD 03/24/2006  . HIATAL HERNIA 03/24/2006  . CROHN'S DISEASE 03/24/2006  . IBS 03/24/2006  . ARTHRITIS 03/24/2006  . LOW BACK PAIN 03/24/2006  . OSTEOPENIA 03/24/2006  . PANCREATITIS, HX OF 03/24/2006    Past Surgical History: Past Surgical History:  Procedure Laterality Date  .  Partial Hysterectomy  about 20 years ago  . CHOLECYSTECTOMY    . COLONOSCOPY  01/2006  October 2007 revealinga ileal ulcers proximal to anastomosis without stricture and a single rectal ulcer.  . COLONOSCOPY WITH PROPOFOL N/A 04/02/2016   Procedure: COLONOSCOPY WITH PROPOFOL;  Surgeon: Rogene Houston, MD;  [facility];  Service: Endoscopy;  Laterality: N/A;  1:00  . ESOPHAGOGASTRODUODENOSCOPY (EGD) WITH PROPOFOL N/A 11/21/2013   Procedure: ESOPHAGOGASTRODUODENOSCOPY (EGD) WITH PROPOFOL;  Surgeon: Rogene Houston, MD;  Location: [facility];  Service:  Endoscopy;  Laterality: N/A;  . TOOTH EXTRACTION Left 10/2013   left lower molar    Allergies: No Known Allergies    Home Medications:  Current Outpatient Medications:  .  ALPRAZolam (XANAX) 0.5 MG tablet, Take 0.5 mg by mouth at bedtime as needed for sleep. , Disp: , Rfl:  .  Calcium Carbonate-Vitamin D (CALCIUM 500 + D PO), Take by mouth daily., Disp: , Rfl:  .  ciprofloxacin (CIPRO) 500 MG tablet, Take 1 tablet (500 mg total) by mouth 2 (two) times daily., Disp: 14 tablet, Rfl: 0 .  eszopiclone (LUNESTA) 1 MG TABS tablet, Take 1 mg by mouth at bedtime., Disp: , Rfl:  .  mercaptopurine (PURINETHOL) 50 MG tablet, TAKE 1 TABLET BY MOUTH ONCE DAILY;TAKE 1 HOUR BEFORE OR 2 HOURS AFTER MEALS., Disp: 30 tablet, Rfl: 0 .  Multiple Vitamin (MULTIVITAMIN WITH MINERALS) TABS tablet, Take 1 tablet by mouth daily., Disp: , Rfl:  .  oxyCODONE (OXYCONTIN) 40 MG 12 hr tablet, Take 40-120 mg by mouth 3 (three) times daily as needed for pain (depends on pain level if takes 40 mg -120 mg). , Disp: , Rfl:  .  pantoprazole (PROTONIX) 40 MG tablet, TAKE ONE TABLET BY MOUTH ONCE DAILY., Disp: 30 tablet, Rfl: 0   Family History: family history includes Cancer in her maternal grandmother; Dementia in her maternal grandmother and mother; Diabetes in her sister; Healthy in her daughter and son; Heart attack in her father and mother; Heart disease in her father.    Social History:   reports that she quit smoking about 4 years ago. Her smoking use included cigarettes. She has a 8.75 pack-year smoking history. She has never used smokeless tobacco. She reports that she does not drink alcohol and does not use drugs.   Review of Systems: Constitutional: Denies weight loss/weight gain  Eyes: No changes in vision. ENT: No oral lesions, sore throat.  GI: see HPI.  Heme/Lymph: No easy bruising.  CV: No chest pain.  GU: +dysuria  Integumentary: No rashes.  Neuro: No headaches.  Psych: No depression/anxiety.    Endocrine: No heat/cold intolerance.  Allergic/Immunologic: No urticaria.  Resp: No cough, SOB.  Musculoskeletal: + joint pain.    Physical Examination: BP (!) 155/68 (BP Location: Right Arm, Patient Position: Sitting, Cuff Size: Normal)   Pulse 73   Temp 98.1 F (36.7 C) (Oral)   Ht 5' 2.5" (1.588 m)   Wt 129 lb 3.2 oz (58.6 kg)   BMI 23.25 kg/m  Gen: NAD, alert and oriented x 4 HEENT: PEERLA, EOMI, Neck: supple, no JVD Chest: CTA bilaterally, no wheezes, crackles, or other adventitious sounds CV: RRR, no m/g/c/r Abd: soft, TTP most notably suprapubic area, ND, +BS in all four quadrants; no HSM, guarding, ridigity, or rebound tenderness Ext: no edema, well perfused with 2+ pulses, Skin: no rash or lesions noted on observed skin Lymph: no noted LAD  Data Reviewed:   Urinanalysis 06/2019-reviewed showing cloudy appearance, trace ketones, 2+ leukocytes, RBC 3-10 Urine culture no Growth   05/2019 labs reviewed-LFTs elevated  with AST 49, ALT 36   Assessment/Plan: Ms. Bednarz is a 59 y.o. female    Saja was seen today for crohn's disease.  Diagnoses and all orders for this visit:  Diarrhea, unspecified type -     CBC with Differential -     COMPLETE METABOLIC PANEL WITH GFR -     Sed Rate (ESR) -     C-reactive protein  Crohn's disease without complication, unspecified gastrointestinal tract location (HCC) -     CBC with Differential -     COMPLETE METABOLIC PANEL WITH GFR -     Sed Rate (ESR) -     C-reactive protein  Dysuria -     Urine Culture -     Urinalysis -     Ambulatory referral to Urology     1.  Crohn's Disease-on 48m 6-MP daily. In February 2021 had minimal elevation of LFTs, she has not had a follow-up ultrasound.  We will repeat today-continue be elevated will need liver ultrasound for further evaluation.  2.  Diarrhea-generally well controlled with formed stools.  She notes flares of diarrhea related to stress and may represent a flare of  irritable bowel. Offered option of Levsin or dicyclomine but she feels this is controllable and can use Imodium as needed  3.  GERD-on Protonix 40 mg daily. EGD 2015 without barretts  4. Dysuria - reports last week was treated with a course of Cipro with adequate response. She is tender suprapubically today and endorses a lot of burning and foul-smelling urine.  She has had frequent UTIs over the past 6 months and do feel a referral to urology is indicated and this was placed today.  Will check UA and urine culture for evaluation   Will need 6 month f/up.   I personally performed the service, non-incident to. (WP)  JLaurine Blazer [facility]

## 2020-02-13 ENCOUNTER — Other Ambulatory Visit (INDEPENDENT_AMBULATORY_CARE_PROVIDER_SITE_OTHER): Payer: Self-pay | Admitting: Gastroenterology

## 2020-02-13 LAB — COMPLETE METABOLIC PANEL WITH GFR
AG Ratio: 1.5 (calc) (ref 1.0–2.5)
ALT: 27 U/L (ref 6–29)
AST: 39 U/L — ABNORMAL HIGH (ref 10–35)
Albumin: 4.2 g/dL (ref 3.6–5.1)
Alkaline phosphatase (APISO): 92 U/L (ref 37–153)
BUN: 9 mg/dL (ref 7–25)
CO2: 28 mmol/L (ref 20–32)
Calcium: 9.6 mg/dL (ref 8.6–10.4)
Chloride: 106 mmol/L (ref 98–110)
Creat: 0.79 mg/dL (ref 0.50–0.99)
GFR, Est African American: 94 mL/min/{1.73_m2} (ref >=60)
GFR, Est Non African American: 81 mL/min/{1.73_m2} (ref >=60)
Globulin: 2.8 g/dL (calc) (ref 1.9–3.7)
Glucose, Bld: 114 mg/dL — ABNORMAL HIGH (ref 65–99)
Potassium: 4.5 mmol/L (ref 3.5–5.3)
Sodium: 140 mmol/L (ref 135–146)
Total Bilirubin: 0.6 mg/dL (ref 0.2–1.2)
Total Protein: 7 g/dL (ref 6.1–8.1)

## 2020-02-13 LAB — CBC WITH DIFFERENTIAL/PLATELET
Absolute Monocytes: 302 cells/uL (ref 200–950)
Basophils Absolute: 32 cells/uL (ref 0–200)
Basophils Relative: 0.6 %
Eosinophils Absolute: 48 cells/uL (ref 15–500)
Eosinophils Relative: 0.9 %
HCT: 38.6 % (ref 35.0–45.0)
Hemoglobin: 13.4 g/dL (ref 11.7–15.5)
Lymphs Abs: 1521 cells/uL (ref 850–3900)
MCH: 35.6 pg — ABNORMAL HIGH (ref 27.0–33.0)
MCHC: 34.7 g/dL (ref 32.0–36.0)
MCV: 102.7 fL — ABNORMAL HIGH (ref 80.0–100.0)
MPV: 12.8 fL — ABNORMAL HIGH (ref 7.5–12.5)
Monocytes Relative: 5.7 %
Neutro Abs: 3397 cells/uL (ref 1500–7800)
Neutrophils Relative %: 64.1 %
Platelets: 130 10*3/uL — ABNORMAL LOW (ref 140–400)
RBC: 3.76 10*6/uL — ABNORMAL LOW (ref 3.80–5.10)
RDW: 13.5 % (ref 11.0–15.0)
Total Lymphocyte: 28.7 %
WBC: 5.3 10*3/uL (ref 3.8–10.8)

## 2020-02-13 LAB — URINALYSIS
Bilirubin Urine: NEGATIVE
Glucose, UA: NEGATIVE
Ketones, ur: NEGATIVE
Nitrite: POSITIVE — AB
Specific Gravity, Urine: 1.017 (ref 1.001–1.03)
pH: 5.5 (ref 5.0–8.0)

## 2020-02-13 LAB — URINE CULTURE
MICRO NUMBER:: 11114681
SPECIMEN QUALITY:: ADEQUATE

## 2020-02-13 LAB — C-REACTIVE PROTEIN: CRP: 4.6 mg/L (ref <8.0)

## 2020-02-13 LAB — SEDIMENTATION RATE: Sed Rate: 39 mm/h — ABNORMAL HIGH (ref 0–30)

## 2020-02-13 MED ORDER — NITROFURANTOIN MONOHYD MACRO 100 MG PO CAPS: 100.0000 mg | ORAL_CAPSULE | Freq: Two times a day (BID) | ORAL | 0 refills | Status: AC

## 2020-02-13 NOTE — Progress Notes (Cosign Needed)
Discussed with patient via phone.  She has been on Cipro for 3-day course recently, will try her on Macrobid for 5 days.  If she does not improve symptom wise she will follow-up with her primary care.

## 2020-02-28 ENCOUNTER — Other Ambulatory Visit (INDEPENDENT_AMBULATORY_CARE_PROVIDER_SITE_OTHER): Payer: Self-pay | Admitting: Gastroenterology

## 2020-02-28 DIAGNOSIS — K219 Gastro-esophageal reflux disease without esophagitis: Secondary | ICD-10-CM

## 2020-02-28 DIAGNOSIS — K5000 Crohn's disease of small intestine without complications: Secondary | ICD-10-CM

## 2020-03-03 ENCOUNTER — Telehealth (INDEPENDENT_AMBULATORY_CARE_PROVIDER_SITE_OTHER): Payer: Self-pay | Admitting: Gastroenterology

## 2020-03-03 NOTE — Telephone Encounter (Signed)
Patient left message stating she is checking on her refill request - states the pharmacy has not received anything - please advise - ph# [phone]

## 2020-03-03 NOTE — Telephone Encounter (Signed)
Patient was called and advised that both medications 6 MP and the PPI had been sent to the [facility] this morning.

## 2020-03-25 ENCOUNTER — Ambulatory Visit (INDEPENDENT_AMBULATORY_CARE_PROVIDER_SITE_OTHER): Payer: Medicare Other | Admitting: Urology

## 2020-03-25 ENCOUNTER — Other Ambulatory Visit: Payer: Self-pay

## 2020-03-25 ENCOUNTER — Encounter: Payer: Self-pay | Admitting: Urology

## 2020-03-25 VITALS — BP 112/74 | HR 70 | Temp 98.0°F | Wt 128.0 lb

## 2020-03-25 DIAGNOSIS — R3129 Other microscopic hematuria: Secondary | ICD-10-CM | POA: Diagnosis not present

## 2020-03-25 DIAGNOSIS — R8281 Pyuria: Secondary | ICD-10-CM | POA: Diagnosis not present

## 2020-03-25 DIAGNOSIS — R350 Frequency of micturition: Secondary | ICD-10-CM

## 2020-03-25 LAB — URINALYSIS, ROUTINE W REFLEX MICROSCOPIC
Bilirubin, UA: NEGATIVE
Glucose, UA: NEGATIVE
Ketones, UA: NEGATIVE
Nitrite, UA: POSITIVE — AB
Protein,UA: NEGATIVE
Specific Gravity, UA: 1.015 (ref 1.005–1.030)
Urobilinogen, Ur: 0.2 mg/dL (ref 0.2–1.0)
pH, UA: 6 (ref 5.0–7.5)

## 2020-03-25 LAB — MICROSCOPIC EXAMINATION
Renal Epithel, UA: NONE SEEN /hpf
WBC, UA: >30 /hpf — AB (ref 0–5)

## 2020-03-25 LAB — BLADDER SCAN AMB NON-IMAGING: Scan Result: 21

## 2020-03-25 NOTE — Progress Notes (Signed)
Bladder Scan Patient can void: 21 ml Performed By: Estill Bamberg RN  Urological Symptom Review  Patient is experiencing the following symptoms: Frequent urination Burning/pain with urination Get up at night to urinate Urinary tract infection   Review of Systems  Gastrointestinal (upper)  : Nausea Vomiting Indigestion/heartburn  Gastrointestinal (lower) : Diarrhea  Constitutional : Night Sweats  Skin: Negative for skin symptoms  Eyes: Negative for eye symptoms  Ear/Nose/Throat : Negative for Ear/Nose/Throat symptoms  Hematologic/Lymphatic: Negative for Hematologic/Lymphatic symptoms  Cardiovascular : Negative for cardiovascular symptoms  Respiratory : Negative for respiratory symptoms  Endocrine: Negative for endocrine symptoms  Musculoskeletal: Back pain Joint pain  Neurological: Negative for neurological symptoms  Psychologic: Anxiety

## 2020-03-25 NOTE — Progress Notes (Signed)
H&P  Chief Complaint: UTI  History of Present Illness: [name] is a 59 y.o. year old female new patient here for referral of her dysuria. She experiences recurrent UTIs and reports that these are often accompanied by a foul-smelling odor, back pain, urinary frequency, and abdominal pain. She denies and high fevers or gross hematuria. She reports that she is currently experiencing a UTI in that she has back pain and foul smelling urine. She was treated 2 months ago with a 3 day course of cipro (BID) and reports that her symptoms started to resolve, but came back after finishing her regimen. She denies any pneumaturia. She has Crohn's disease. She notes that she has had 2 UTIs in the past year and 15-20 UTIs in the past 5 years. She is not sexually active and denies any constipation.  Past Medical History:  Diagnosis Date  . Anorexia 10/01/2010  . Arthritis   . Chronic pain syndrome    bones and stomach  . COPD (chronic obstructive pulmonary disease) ([place])   . Crohn's 10/01/2010  . Diarrhea 10/01/2010  . Emesis 10/01/2010  . GERD (gastroesophageal reflux disease)   . Lower abdominal pain 10/01/2010  . Rectal bleeding 10/01/2010    Past Surgical History:  Procedure Laterality Date  .  Partial Hysterectomy  about 20 years ago  . CHOLECYSTECTOMY    . COLONOSCOPY  01/2006   October 2007 revealinga ileal ulcers proximal to anastomosis without stricture and a single rectal ulcer.  . COLONOSCOPY WITH PROPOFOL N/A 04/02/2016   Procedure: COLONOSCOPY WITH PROPOFOL;  Surgeon: Rogene Houston, MD;  [facility];  Service: Endoscopy;  Laterality: N/A;  1:00  . ESOPHAGOGASTRODUODENOSCOPY (EGD) WITH PROPOFOL N/A 11/21/2013   Procedure: ESOPHAGOGASTRODUODENOSCOPY (EGD) WITH PROPOFOL;  Surgeon: Rogene Houston, MD;  Location: [facility];  Service: Endoscopy;  Laterality: N/A;  . TOOTH EXTRACTION Left 10/2013   left lower molar    Home Medications:  (Not in a hospital  admission)   Allergies: No Known Allergies  Family History  Problem Relation Age of Onset  . Dementia Mother   . Heart attack Mother        x 2  . Heart disease Father   . Heart attack Father   . Diabetes Sister   . Healthy Daughter   . Healthy Son   . Dementia Maternal Grandmother   . Cancer Maternal Grandmother        breast    Social History:  reports that she quit smoking about 4 years ago. Her smoking use included cigarettes. She has a 8.75 pack-year smoking history. She has never used smokeless tobacco. She reports that she does not drink alcohol and does not use drugs.  ROS: A complete review of systems was performed.  All systems are negative except for pertinent findings as noted.  Physical Exam:  Vital signs in last 24 hours: BP: ()/()  Arterial Line BP: ()/()  General:  Alert and oriented, No acute distress HEENT: Normocephalic, atraumatic Neck: No JVD Extremities: No edema Neurologic: Grossly intact  I have reviewed prior pt notes  I have reviewed notes from referring/previous physicians  I have reviewed urinalysis results  I have reviewed prior urine culture  Impression/Assessment:  UTI - UA reviewed and pt may well just be colonized--she also has microscopic hematuria.  Plan:  1. Pt advised regarding probable bladder colonization and urine will be sent for culture today.  2. Pt will have BUN/Creatinine drawn today.  3. Pt  will f/u with CT of her abdomen/pelvis  4. Pt advised to take cranberry capsules and avoid caffeine intake.   5. F/U after CT for OV and cysto.  CC: Carylon Perches, NP  Budd Palmer 03/25/2020, 11:27 AM  Lillette Boxer. Tallis Soledad MD

## 2020-03-26 LAB — BUN+CREAT
BUN/Creatinine Ratio: 8 — ABNORMAL LOW (ref 12–28)
BUN: 6 mg/dL — ABNORMAL LOW (ref 8–27)
Creatinine, Ser: 0.72 mg/dL (ref 0.57–1.00)
GFR calc Af Amer: 105 mL/min/{1.73_m2} (ref >59)
GFR calc non Af Amer: 91 mL/min/{1.73_m2} (ref >59)

## 2020-03-30 LAB — URINE CULTURE

## 2020-03-31 ENCOUNTER — Other Ambulatory Visit: Payer: Self-pay

## 2020-03-31 ENCOUNTER — Telehealth: Payer: Self-pay

## 2020-03-31 ENCOUNTER — Telehealth (INDEPENDENT_AMBULATORY_CARE_PROVIDER_SITE_OTHER): Payer: Self-pay | Admitting: Internal Medicine

## 2020-03-31 DIAGNOSIS — R350 Frequency of micturition: Secondary | ICD-10-CM

## 2020-03-31 MED ORDER — NITROFURANTOIN MONOHYD MACRO 100 MG PO CAPS
100.0000 mg | ORAL_CAPSULE | Freq: Two times a day (BID) | ORAL | 0 refills | Status: DC
Start: 2020-03-31 — End: 2021-06-01

## 2020-03-31 NOTE — Telephone Encounter (Signed)
Prescription sent to pharmacy.  Message left for patient.

## 2020-03-31 NOTE — Telephone Encounter (Signed)
-----   Message from Cleon Gustin, MD sent at 03/31/2020  1:02 PM EST ----- Macrobid 190m BID for 7 days ----- Message ----- From: LDorisann Frames RN Sent: 03/31/2020  11:57 AM EST To: PCleon Gustin MD  Please review

## 2020-03-31 NOTE — Telephone Encounter (Signed)
Patient left message stating her pharmacy has faxed 2 refill request - please advise - ph# [phone]

## 2020-03-31 NOTE — Telephone Encounter (Signed)
I called and left a detailed message asked that the patient give our fax number of [phone] to her pharmacy to send the refill requests as we have not received any refill requests on this patient.

## 2020-04-02 ENCOUNTER — Other Ambulatory Visit (HOSPITAL_COMMUNITY): Payer: Self-pay | Admitting: Family Medicine

## 2020-04-02 DIAGNOSIS — Z1231 Encounter for screening mammogram for malignant neoplasm of breast: Secondary | ICD-10-CM

## 2020-04-15 ENCOUNTER — Telehealth (INDEPENDENT_AMBULATORY_CARE_PROVIDER_SITE_OTHER): Payer: Self-pay | Admitting: Internal Medicine

## 2020-04-15 ENCOUNTER — Other Ambulatory Visit (INDEPENDENT_AMBULATORY_CARE_PROVIDER_SITE_OTHER): Payer: Self-pay | Admitting: Internal Medicine

## 2020-04-15 DIAGNOSIS — K219 Gastro-esophageal reflux disease without esophagitis: Secondary | ICD-10-CM

## 2020-04-15 DIAGNOSIS — K5000 Crohn's disease of small intestine without complications: Secondary | ICD-10-CM

## 2020-04-15 MED ORDER — MERCAPTOPURINE 50 MG PO TABS: ORAL_TABLET | ORAL | 5 refills | Status: DC

## 2020-04-15 MED ORDER — PANTOPRAZOLE SODIUM 40 MG PO TBEC
40.0000 mg | DELAYED_RELEASE_TABLET | Freq: Every day | ORAL | 5 refills | Status: DC
Start: 2020-04-15 — End: 2020-10-22

## 2020-04-15 NOTE — Telephone Encounter (Signed)
Patient left voice mail stating she has called 3 times about a refill request - please advise - ph# [phone]

## 2020-04-15 NOTE — Telephone Encounter (Signed)
Prescription for both medications sent to patient's pharmacy 1 month each with 5 refills. Patient will be due for CBC with differential and comprehensive chemistry panel in February 2022

## 2020-04-15 NOTE — Telephone Encounter (Signed)
I spoke with  the patient and she state [name] had sent refill requests to [place] for refills on Mercaptopurine 50 mg once po Qd and also on her Pantoprazole 40 mg one po QD. Last seen by Thayer Headings 02/07/2020 for diarrhea and crohns. Please advise.

## 2020-04-15 NOTE — Telephone Encounter (Signed)
I called and left a detailed message that the medication had been sent in at her request with 5 additional refills. I asked that she call me back to discuss the blood work in Feb 2022.

## 2020-04-16 ENCOUNTER — Other Ambulatory Visit (INDEPENDENT_AMBULATORY_CARE_PROVIDER_SITE_OTHER): Payer: Self-pay

## 2020-04-16 DIAGNOSIS — K5000 Crohn's disease of small intestine without complications: Secondary | ICD-10-CM

## 2020-04-16 DIAGNOSIS — R7989 Other specified abnormal findings of blood chemistry: Secondary | ICD-10-CM

## 2020-04-16 DIAGNOSIS — K219 Gastro-esophageal reflux disease without esophagitis: Secondary | ICD-10-CM

## 2020-04-16 DIAGNOSIS — D696 Thrombocytopenia, unspecified: Secondary | ICD-10-CM

## 2020-04-16 NOTE — Telephone Encounter (Signed)
Patient is aware that the medication has been sent to the pharmacy. She states they called her this am to let her know to pick them up. She is also aware she will need blood work in February 2022, I asked that she mark her calendar for February to have these done. She states she uses [facility]. The orders have been placed in Epic and printed and mailed to the patient with the instructions to take the orders with her to the lab when she goes with the address to [facility] also noted on the order. Patient states understanding.

## 2020-04-17 ENCOUNTER — Ambulatory Visit (HOSPITAL_COMMUNITY): Payer: Medicare Other

## 2020-04-21 ENCOUNTER — Other Ambulatory Visit (HOSPITAL_COMMUNITY): Payer: Self-pay | Admitting: Urology

## 2020-04-21 NOTE — Addendum Note (Signed)
Addended by: Dorisann Frames on: 04/21/2020 09:37 AM   Modules accepted: Orders

## 2020-04-25 ENCOUNTER — Ambulatory Visit (HOSPITAL_COMMUNITY)
Admission: RE | Admit: 2020-04-25 | Discharge: 2020-04-25 | Disposition: A | Discharge status: Home / Self Care | Payer: Medicare Other | Source: Ambulatory Visit | Attending: Family Medicine | Admitting: Family Medicine

## 2020-04-25 ENCOUNTER — Other Ambulatory Visit: Payer: Self-pay

## 2020-04-25 DIAGNOSIS — Z1231 Encounter for screening mammogram for malignant neoplasm of breast: Secondary | ICD-10-CM | POA: Diagnosis present

## 2020-04-29 ENCOUNTER — Other Ambulatory Visit: Payer: Medicare Other | Admitting: Urology

## 2020-04-29 ENCOUNTER — Ambulatory Visit (HOSPITAL_COMMUNITY): Payer: Medicare Other

## 2020-05-13 ENCOUNTER — Ambulatory Visit (HOSPITAL_COMMUNITY)
Admission: RE | Admit: 2020-05-13 | Discharge: 2020-05-13 | Disposition: A | Discharge status: Home / Self Care | Payer: Medicare Other | Source: Ambulatory Visit | Attending: Urology | Admitting: Urology

## 2020-05-13 ENCOUNTER — Other Ambulatory Visit: Payer: Self-pay

## 2020-05-13 ENCOUNTER — Ambulatory Visit (HOSPITAL_COMMUNITY): Payer: Medicare Other

## 2020-05-13 DIAGNOSIS — R3129 Other microscopic hematuria: Secondary | ICD-10-CM

## 2020-05-13 LAB — POCT I-STAT CREATININE: Creatinine, Ser: 0.8 mg/dL (ref 0.44–1.00)

## 2020-05-13 MED ORDER — IOHEXOL 300 MG/ML  SOLN
150.0000 mL | Freq: Once | INTRAMUSCULAR | Status: AC | PRN
  Administered 2020-05-13: 125 mL via INTRAVENOUS

## 2020-05-20 ENCOUNTER — Other Ambulatory Visit: Payer: Self-pay

## 2020-05-20 ENCOUNTER — Encounter: Payer: Self-pay | Admitting: Urology

## 2020-05-20 ENCOUNTER — Ambulatory Visit (INDEPENDENT_AMBULATORY_CARE_PROVIDER_SITE_OTHER): Payer: Medicare Other | Admitting: Urology

## 2020-05-20 VITALS — BP 149/85 | HR 68 | Temp 98.3°F | Ht 62.5 in | Wt 128.0 lb

## 2020-05-20 DIAGNOSIS — R350 Frequency of micturition: Secondary | ICD-10-CM

## 2020-05-20 DIAGNOSIS — R3129 Other microscopic hematuria: Secondary | ICD-10-CM

## 2020-05-20 LAB — URINALYSIS, ROUTINE W REFLEX MICROSCOPIC
Bilirubin, UA: NEGATIVE
Glucose, UA: NEGATIVE
Ketones, UA: NEGATIVE
Nitrite, UA: NEGATIVE
Protein,UA: NEGATIVE
Specific Gravity, UA: <1.005 — ABNORMAL LOW (ref 1.005–1.030)
Urobilinogen, Ur: 0.2 mg/dL (ref 0.2–1.0)
pH, UA: 5.5 (ref 5.0–7.5)

## 2020-05-20 LAB — MICROSCOPIC EXAMINATION

## 2020-05-20 MED ORDER — CIPROFLOXACIN HCL 500 MG PO TABS
500.0000 mg | ORAL_TABLET | Freq: Once | ORAL | Status: AC
  Administered 2020-05-20: 500 mg via ORAL

## 2020-05-20 NOTE — Progress Notes (Signed)
History of Present Illness: [name] is a 60 y.o. year old female who returns for follow-up/completion of hematuria evaluation.  2.1.2022:CT scan performed on 1.25.2022.  This revealed no CT findings which would explain her hematuria.  She had evidence of a partial right hemicolectomy and she had extensive calcification of the pancreatic parenchyma consistent with chronic pancreatitis.  This was similar prior appearances.  There was postoperative biliary ductal dilatation, similar to prior CT scan.  (below copied from [facility] records):  12.7.2021: [name] is a 60 y.o. year old female new patient here for referral of her dysuria. She experiences recurrent UTIs and reports that these are often accompanied by a foul-smelling odor, back pain, urinary frequency, and abdominal pain. She denies and high fevers or gross hematuria. She reports that she is currently experiencing a UTI in that she has back pain and foul smelling urine. She was treated 2 months ago with a 3 day course of cipro (BID) and reports that her symptoms started to resolve, but came back after finishing her regimen. She denies any pneumaturia. She has Crohn's disease. She notes that she has had 2 UTIs in the past year and 15-20 UTIs in the past 5 years. She is not sexually active and denies any constipation.  Past Medical History:  Diagnosis Date  . Anorexia 10/01/2010  . Arthritis   . Chronic pain syndrome    bones and stomach  . COPD (chronic obstructive pulmonary disease) ([place])   . Crohn's 10/01/2010  . Diarrhea 10/01/2010  . Emesis 10/01/2010  . GERD (gastroesophageal reflux disease)   . Lower abdominal pain 10/01/2010  . Rectal bleeding 10/01/2010    Past Surgical History:  Procedure Laterality Date  .  Partial Hysterectomy  about 20 years ago  . CHOLECYSTECTOMY    . COLONOSCOPY  01/2006   October 2007 revealinga ileal ulcers proximal to anastomosis without stricture and a single rectal ulcer.  . COLONOSCOPY  WITH PROPOFOL N/A 04/02/2016   Procedure: COLONOSCOPY WITH PROPOFOL;  Surgeon: Rogene Houston, MD;  [facility]: [facility];  Service: Endoscopy;  Laterality: N/A;  1:00  . ESOPHAGOGASTRODUODENOSCOPY (EGD) WITH PROPOFOL N/A 11/21/2013   Procedure: ESOPHAGOGASTRODUODENOSCOPY (EGD) WITH PROPOFOL;  Surgeon: Rogene Houston, MD;  Location: [facility];  Service: Endoscopy;  Laterality: N/A;  . TOOTH EXTRACTION Left 10/2013   left lower molar    Home Medications:  (Not in a hospital admission)   Allergies: No Known Allergies  Family History  Problem Relation Age of Onset  . Dementia Mother   . Heart attack Mother        x 2  . Heart disease Father   . Heart attack Father   . Diabetes Sister   . Healthy Daughter   . Healthy Son   . Dementia Maternal Grandmother   . Cancer Maternal Grandmother        breast    Social History:  reports that she quit smoking about 4 years ago. Her smoking use included cigarettes. She has a 8.75 pack-year smoking history. She has never used smokeless tobacco. She reports that she does not drink alcohol and does not use drugs.  ROS: A complete review of systems was performed.  All systems are negative except for pertinent findings as noted.  Physical Exam:  Vital signs in last 24 hours:   General:  Alert and oriented, No acute distress HEENT: Normocephalic, atraumatic Neck: No JVD or lymphadenopathy Cardiovascular: Regular rate  Lungs: Normal inspiratory/expiratory excursion  Abdomen: Soft, nontender, nondistended, no abdominal masses Back: No CVA tenderness Extremities: No edema Neurologic: Grossly intact  Cystoscopy Procedure Note:  Indication: Microscopic hematuria  After informed consent and discussion of the procedure and its risks, [name] was positioned and prepped in the standard fashion.  Cystoscopy was performed with a flexible cystoscope.   Findings: Urethra: Normal Ureteral orifices: Normal configuration, normal  placement Bladder: A small amount of sediment present.  There were no urothelial abnormalities.  The patient tolerated the procedure well.     Impression/Assessment:  1.  Microscopic hematuria with negative evaluation--CT, cystoscopy  2.  Overactive bladder.  Plan:  1.  Overactive bladder guide sheet given  2.  Reassured regarding her evaluation  3.  Return as needed  Budd Palmer 05/20/2020, 12:14 PM  Lillette Boxer. Leanda Padmore MD

## 2020-05-20 NOTE — Progress Notes (Signed)
Urological Symptom Review  Patient is experiencing the following symptoms: Frequent urination Burning/pain with urination Get up at night to urinate Stream starts and stops Blood in urine   Review of Systems  Gastrointestinal (upper)  : Nausea Indigestion/heartburn  Gastrointestinal (lower) : Negative for lower GI symptoms  Constitutional : Night Sweats  Skin: Negative for skin symptoms  Eyes: Negative for eye symptoms  Ear/Nose/Throat : Negative for Ear/Nose/Throat symptoms  Hematologic/Lymphatic: Negative for Hematologic/Lymphatic symptoms  Cardiovascular : Negative for cardiovascular symptoms  Respiratory : Negative for respiratory symptoms  Endocrine: Negative for endocrine symptoms  Musculoskeletal: Back pain Joint pain  Neurological: Negative for neurological symptoms  Psychologic: Anxiety

## 2020-06-04 LAB — CBC WITH DIFFERENTIAL/PLATELET
Absolute Monocytes: 435 cells/uL (ref 200–950)
Basophils Absolute: 32 cells/uL (ref 0–200)
Basophils Relative: 0.5 %
Eosinophils Absolute: 50 cells/uL (ref 15–500)
Eosinophils Relative: 0.8 %
HCT: 36.1 % (ref 35.0–45.0)
Hemoglobin: 13 g/dL (ref 11.7–15.5)
Lymphs Abs: 1770 cells/uL (ref 850–3900)
MCH: 36.9 pg — ABNORMAL HIGH (ref 27.0–33.0)
MCHC: 36 g/dL (ref 32.0–36.0)
MCV: 102.6 fL — ABNORMAL HIGH (ref 80.0–100.0)
MPV: 12.7 fL — ABNORMAL HIGH (ref 7.5–12.5)
Monocytes Relative: 6.9 %
Neutro Abs: 4013 cells/uL (ref 1500–7800)
Neutrophils Relative %: 63.7 %
Platelets: 117 10*3/uL — ABNORMAL LOW (ref 140–400)
RBC: 3.52 10*6/uL — ABNORMAL LOW (ref 3.80–5.10)
RDW: 14.2 % (ref 11.0–15.0)
Total Lymphocyte: 28.1 %
WBC: 6.3 10*3/uL (ref 3.8–10.8)

## 2020-06-04 LAB — COMPREHENSIVE METABOLIC PANEL
AG Ratio: 1.6 (calc) (ref 1.0–2.5)
ALT: 29 U/L (ref 6–29)
AST: 46 U/L — ABNORMAL HIGH (ref 10–35)
Albumin: 4.3 g/dL (ref 3.6–5.1)
Alkaline phosphatase (APISO): 116 U/L (ref 37–153)
BUN: 8 mg/dL (ref 7–25)
CO2: 20 mmol/L (ref 20–32)
Calcium: 9.1 mg/dL (ref 8.6–10.4)
Chloride: 107 mmol/L (ref 98–110)
Creat: 0.85 mg/dL (ref 0.50–0.99)
Globulin: 2.7 g/dL (calc) (ref 1.9–3.7)
Glucose, Bld: 92 mg/dL (ref 65–139)
Potassium: 4.2 mmol/L (ref 3.5–5.3)
Sodium: 140 mmol/L (ref 135–146)
Total Bilirubin: 0.5 mg/dL (ref 0.2–1.2)
Total Protein: 7 g/dL (ref 6.1–8.1)

## 2020-06-09 ENCOUNTER — Other Ambulatory Visit (INDEPENDENT_AMBULATORY_CARE_PROVIDER_SITE_OTHER): Payer: Self-pay

## 2020-06-09 DIAGNOSIS — R7989 Other specified abnormal findings of blood chemistry: Secondary | ICD-10-CM

## 2020-06-16 ENCOUNTER — Ambulatory Visit (HOSPITAL_COMMUNITY): Payer: Medicare Other

## 2020-06-23 ENCOUNTER — Other Ambulatory Visit: Payer: Self-pay

## 2020-06-23 ENCOUNTER — Ambulatory Visit (HOSPITAL_COMMUNITY)
Admission: RE | Admit: 2020-06-23 | Discharge: 2020-06-23 | Disposition: A | Discharge status: Home / Self Care | Payer: Medicare Other | Source: Ambulatory Visit | Attending: Internal Medicine | Admitting: Internal Medicine

## 2020-06-23 DIAGNOSIS — R7989 Other specified abnormal findings of blood chemistry: Secondary | ICD-10-CM | POA: Insufficient documentation

## 2020-06-26 ENCOUNTER — Other Ambulatory Visit (INDEPENDENT_AMBULATORY_CARE_PROVIDER_SITE_OTHER): Payer: Self-pay | Admitting: *Deleted

## 2020-06-26 DIAGNOSIS — R197 Diarrhea, unspecified: Secondary | ICD-10-CM

## 2020-06-26 DIAGNOSIS — K5000 Crohn's disease of small intestine without complications: Secondary | ICD-10-CM

## 2020-06-26 DIAGNOSIS — K219 Gastro-esophageal reflux disease without esophagitis: Secondary | ICD-10-CM

## 2020-06-26 DIAGNOSIS — R7989 Other specified abnormal findings of blood chemistry: Secondary | ICD-10-CM

## 2020-06-26 DIAGNOSIS — D696 Thrombocytopenia, unspecified: Secondary | ICD-10-CM

## 2020-08-25 ENCOUNTER — Other Ambulatory Visit: Payer: Self-pay | Admitting: Family Medicine

## 2020-08-25 ENCOUNTER — Other Ambulatory Visit (HOSPITAL_COMMUNITY): Payer: Self-pay | Admitting: Family Medicine

## 2020-08-25 DIAGNOSIS — Z87891 Personal history of nicotine dependence: Secondary | ICD-10-CM

## 2020-09-24 ENCOUNTER — Encounter (HOSPITAL_COMMUNITY): Payer: Self-pay

## 2020-09-24 ENCOUNTER — Ambulatory Visit (HOSPITAL_COMMUNITY)
Admission: RE | Admit: 2020-09-24 | Discharge: 2020-09-24 | Disposition: A | Discharge status: Home / Self Care | Payer: Medicare Other | Source: Ambulatory Visit | Attending: Family Medicine | Admitting: Family Medicine

## 2020-09-24 DIAGNOSIS — Z87891 Personal history of nicotine dependence: Secondary | ICD-10-CM

## 2020-10-22 ENCOUNTER — Other Ambulatory Visit (INDEPENDENT_AMBULATORY_CARE_PROVIDER_SITE_OTHER): Payer: Self-pay | Admitting: Internal Medicine

## 2020-10-22 DIAGNOSIS — K219 Gastro-esophageal reflux disease without esophagitis: Secondary | ICD-10-CM

## 2020-10-22 DIAGNOSIS — K5000 Crohn's disease of small intestine without complications: Secondary | ICD-10-CM

## 2020-10-22 NOTE — Telephone Encounter (Signed)
Will refill for 2 months but she is overdue for a follow up appointment, needs to come to the office for further refills

## 2020-10-22 NOTE — Telephone Encounter (Signed)
Patient aware and I transferred her to [facility] for an appointment.

## 2020-10-24 IMAGING — MG DIGITAL SCREENING BILATERAL MAMMOGRAM WITH TOMO AND CAD
8 series · 9 of 24 positions shown · non-contrast
Comparison: Previous exam(s).

CLINICAL DATA: Screening.

EXAM:
DIGITAL SCREENING BILATERAL MAMMOGRAM WITH TOMO AND CAD

[R CC synth-2D]
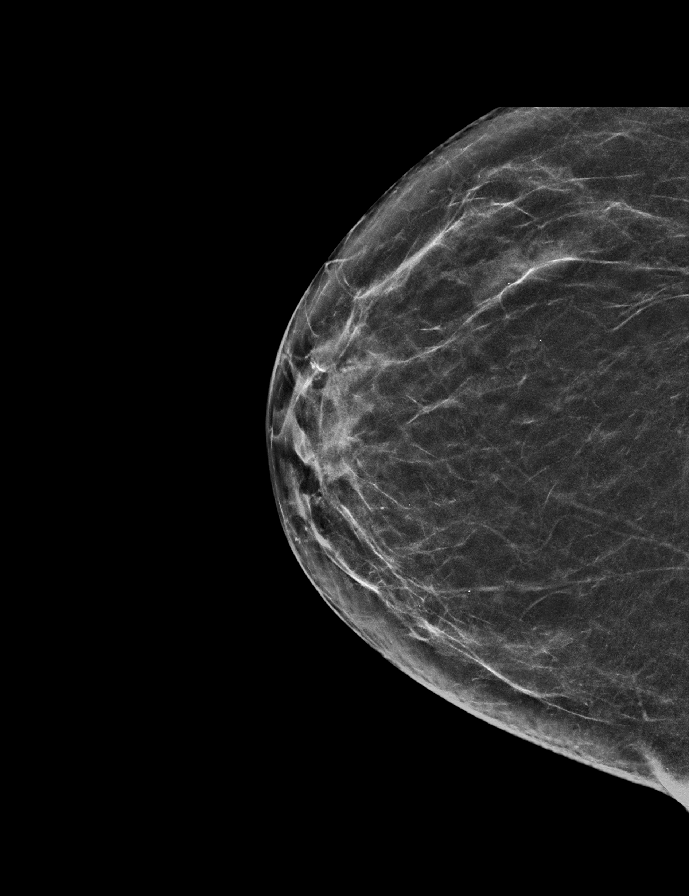

[R MLO synth-2D]
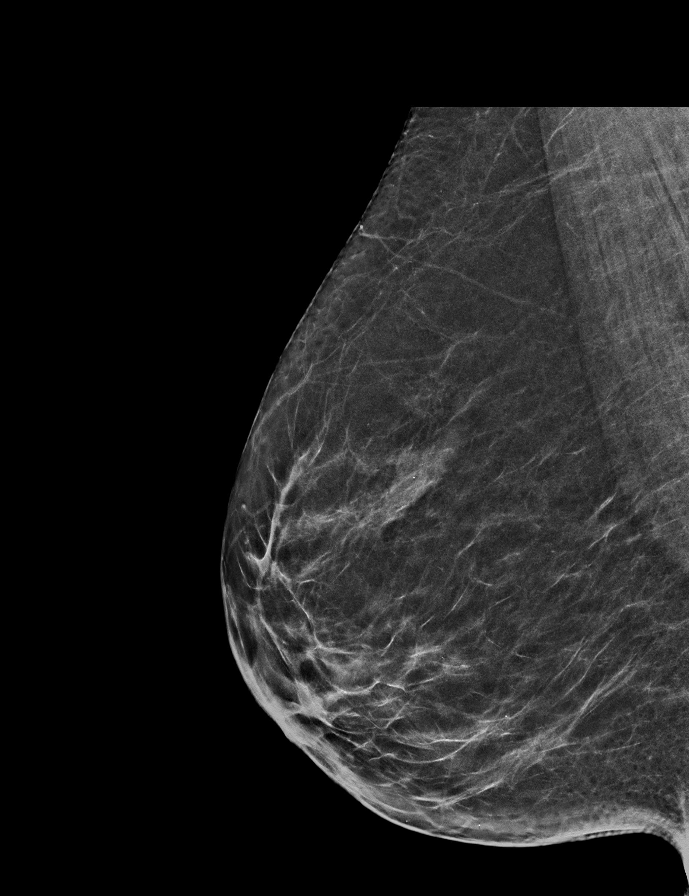

[L CC synth-2D]
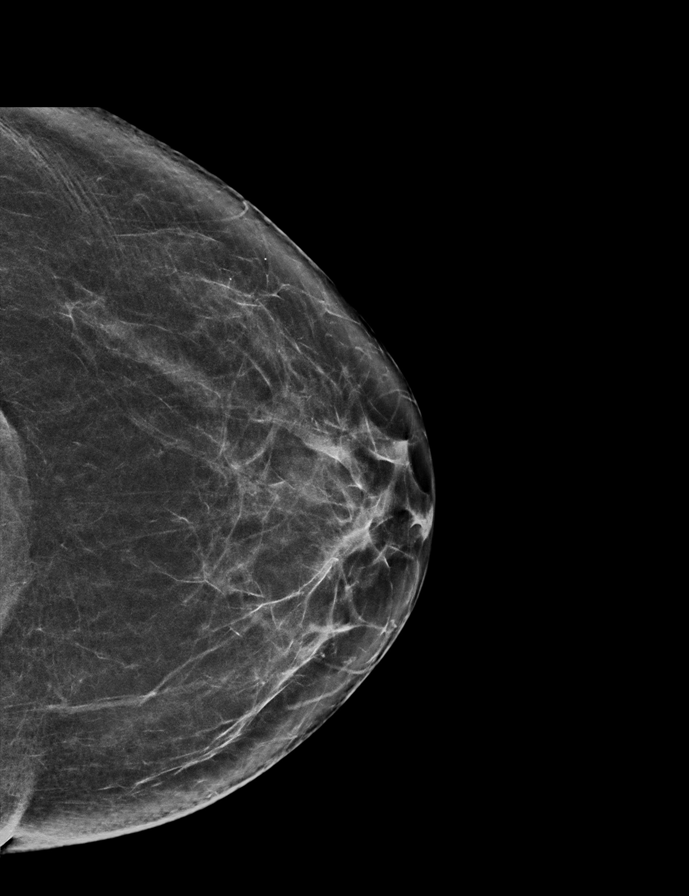

[L MLO synth-2D]
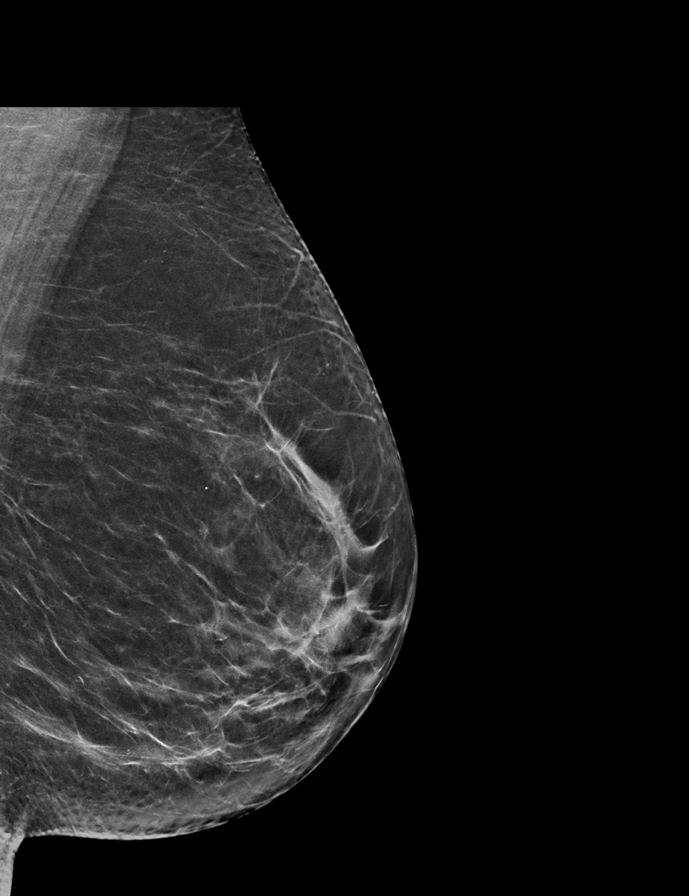

[L MLO tomo · 2 of 60 frames shown]
[frame 20/60]
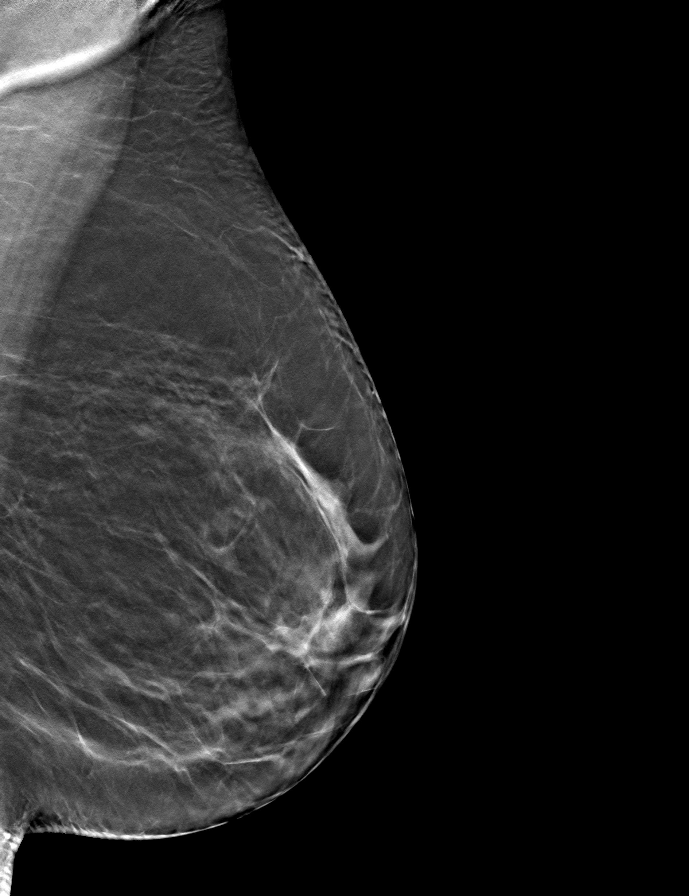
[frame 31/60]
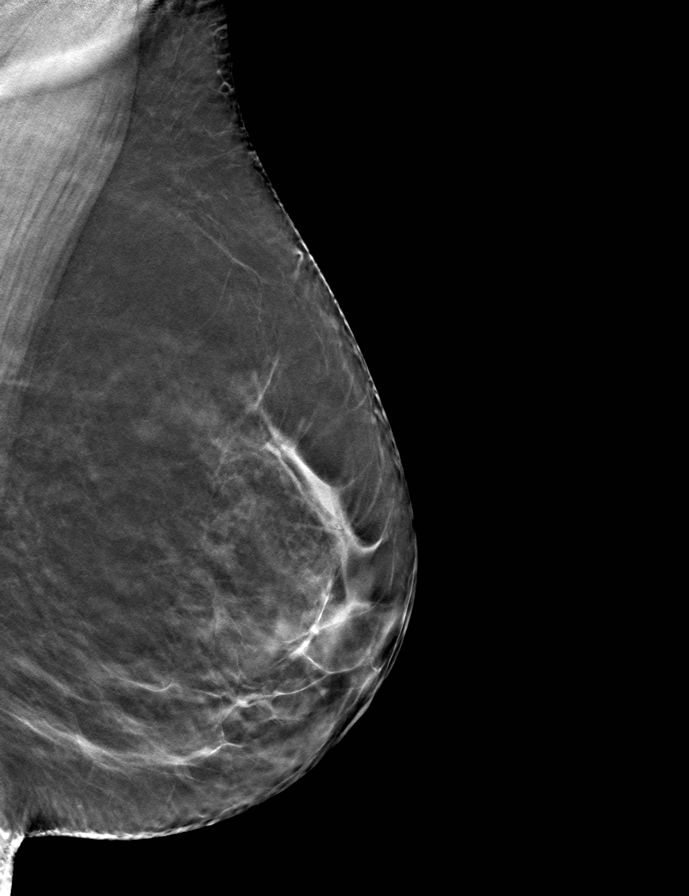

[R MLO tomo · tomo slice 30/59.0]
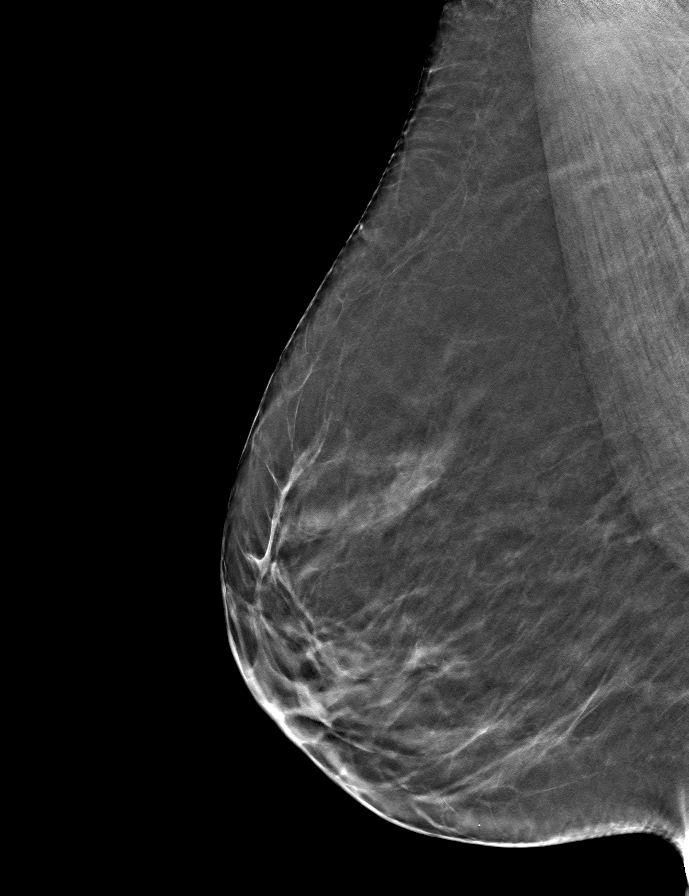

[R CC tomo · tomo slice 30/59.0]
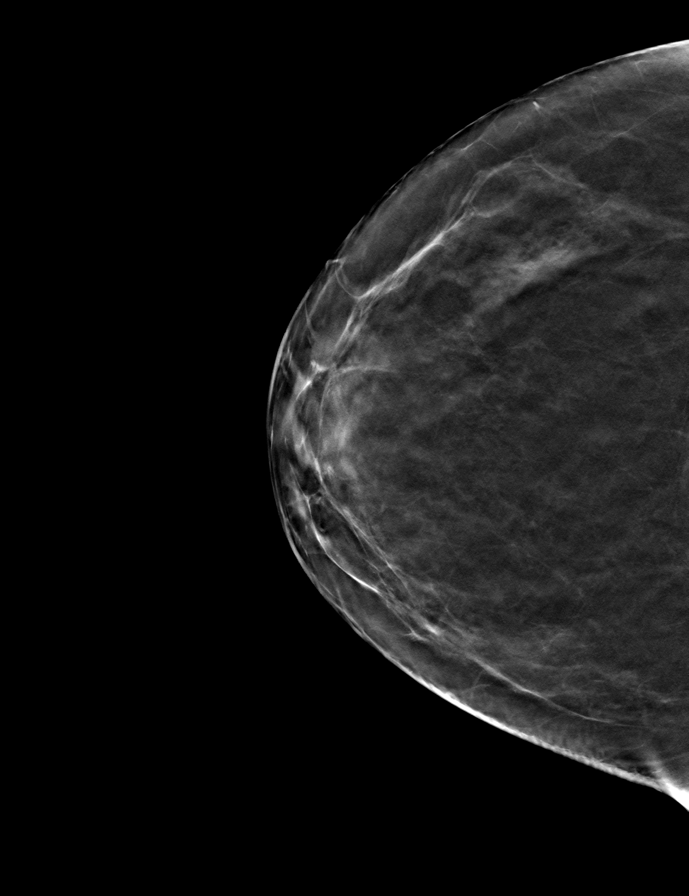

[L CC tomo · tomo slice 33/64.0]
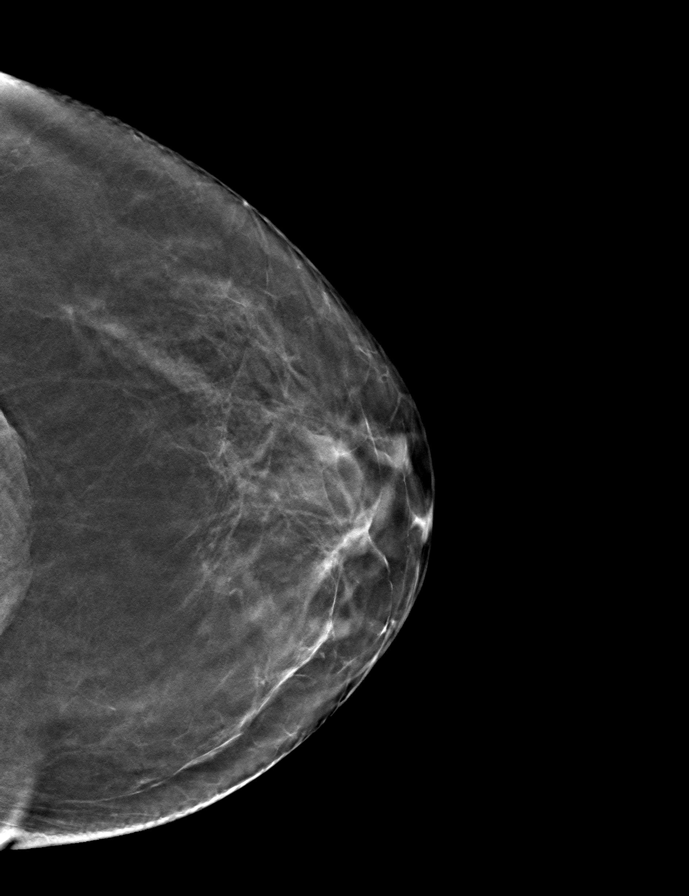

[9 of 24 positions shown; findings below may reference images not displayed]

ACR Breast Density Category b: There are scattered areas of
fibroglandular density.
FINDINGS: There are no findings suspicious for malignancy. Images were
processed with CAD.
IMPRESSION: No mammographic evidence of malignancy. A result letter of this
screening mammogram will be mailed directly to the patient.

RECOMMENDATION:
Screening mammogram in one year. (Code:[id number])

BI-RADS CATEGORY  1: Negative.

## 2020-11-24 ENCOUNTER — Other Ambulatory Visit (INDEPENDENT_AMBULATORY_CARE_PROVIDER_SITE_OTHER): Payer: Self-pay | Admitting: Gastroenterology

## 2020-11-24 DIAGNOSIS — K50818 Crohn's disease of both small and large intestine with other complication: Secondary | ICD-10-CM

## 2020-11-24 DIAGNOSIS — K219 Gastro-esophageal reflux disease without esophagitis: Secondary | ICD-10-CM

## 2020-11-24 DIAGNOSIS — K5000 Crohn's disease of small intestine without complications: Secondary | ICD-10-CM

## 2020-11-24 NOTE — Telephone Encounter (Signed)
Last seen 02/07/20 for crohn's

## 2020-11-24 NOTE — Telephone Encounter (Signed)
We will refill for 3 months until next appointment.  Patient needs to have blood work-up done in the next week as part of her surveillance labs.  Please send her the labs.

## 2020-11-25 NOTE — Telephone Encounter (Signed)
Lab orders placed in the mail with a note for pt to do within the next week

## 2020-12-20 LAB — CBC WITH DIFFERENTIAL/PLATELET
Absolute Monocytes: 517 cells/uL (ref 200–950)
Basophils Absolute: 39 cells/uL (ref 0–200)
Basophils Relative: 0.7 %
Eosinophils Absolute: 83 cells/uL (ref 15–500)
Eosinophils Relative: 1.5 %
HCT: 32.1 % — ABNORMAL LOW (ref 35.0–45.0)
Hemoglobin: 10.9 g/dL — ABNORMAL LOW (ref 11.7–15.5)
Lymphs Abs: 1606 cells/uL (ref 850–3900)
MCH: 36.2 pg — ABNORMAL HIGH (ref 27.0–33.0)
MCHC: 34 g/dL (ref 32.0–36.0)
MCV: 106.6 fL — ABNORMAL HIGH (ref 80.0–100.0)
MPV: 11.5 fL (ref 7.5–12.5)
Monocytes Relative: 9.4 %
Neutro Abs: 3256 cells/uL (ref 1500–7800)
Neutrophils Relative %: 59.2 %
Platelets: 297 10*3/uL (ref 140–400)
RBC: 3.01 10*6/uL — ABNORMAL LOW (ref 3.80–5.10)
RDW: 15.6 % — ABNORMAL HIGH (ref 11.0–15.0)
Total Lymphocyte: 29.2 %
WBC: 5.5 10*3/uL (ref 3.8–10.8)

## 2020-12-20 LAB — COMPREHENSIVE METABOLIC PANEL
AG Ratio: 1.3 (calc) (ref 1.0–2.5)
ALT: 29 U/L (ref 6–29)
AST: 43 U/L — ABNORMAL HIGH (ref 10–35)
Albumin: 3.9 g/dL (ref 3.6–5.1)
Alkaline phosphatase (APISO): 109 U/L (ref 37–153)
BUN: 11 mg/dL (ref 7–25)
CO2: 29 mmol/L (ref 20–32)
Calcium: 9.6 mg/dL (ref 8.6–10.4)
Chloride: 103 mmol/L (ref 98–110)
Creat: 0.8 mg/dL (ref 0.50–1.05)
Globulin: 3 g/dL (calc) (ref 1.9–3.7)
Glucose, Bld: 106 mg/dL — ABNORMAL HIGH (ref 65–99)
Potassium: 4.4 mmol/L (ref 3.5–5.3)
Sodium: 139 mmol/L (ref 135–146)
Total Bilirubin: 0.4 mg/dL (ref 0.2–1.2)
Total Protein: 6.9 g/dL (ref 6.1–8.1)

## 2020-12-24 ENCOUNTER — Other Ambulatory Visit (INDEPENDENT_AMBULATORY_CARE_PROVIDER_SITE_OTHER): Payer: Self-pay | Admitting: Gastroenterology

## 2020-12-24 DIAGNOSIS — K5000 Crohn's disease of small intestine without complications: Secondary | ICD-10-CM

## 2020-12-24 DIAGNOSIS — K219 Gastro-esophageal reflux disease without esophagitis: Secondary | ICD-10-CM

## 2020-12-24 NOTE — Telephone Encounter (Signed)
Last ov with dr Jenetta Downer on 11/24/20 and next appt on 02/19/21. Had labs cbc and cmp on 12/19/20

## 2021-02-19 ENCOUNTER — Ambulatory Visit (INDEPENDENT_AMBULATORY_CARE_PROVIDER_SITE_OTHER): Payer: Medicare Other | Admitting: Gastroenterology

## 2021-03-03 ENCOUNTER — Other Ambulatory Visit (INDEPENDENT_AMBULATORY_CARE_PROVIDER_SITE_OTHER): Payer: Self-pay | Admitting: Gastroenterology

## 2021-03-03 DIAGNOSIS — K219 Gastro-esophageal reflux disease without esophagitis: Secondary | ICD-10-CM

## 2021-03-03 DIAGNOSIS — K5000 Crohn's disease of small intestine without complications: Secondary | ICD-10-CM

## 2021-03-03 NOTE — Telephone Encounter (Signed)
I refilled her medications in June 2022 but have never taken care of her. She was advised to follow up in the clinic for further refills at that time, has not been seen in clinic in over a year. Will refill for a month for now

## 2021-03-03 NOTE — Telephone Encounter (Signed)
11/24/2020 last seen by Dr. Jenetta Downer.

## 2021-03-23 ENCOUNTER — Other Ambulatory Visit (HOSPITAL_COMMUNITY): Payer: Self-pay | Admitting: Family Medicine

## 2021-03-23 DIAGNOSIS — Z1231 Encounter for screening mammogram for malignant neoplasm of breast: Secondary | ICD-10-CM

## 2021-04-03 ENCOUNTER — Other Ambulatory Visit (INDEPENDENT_AMBULATORY_CARE_PROVIDER_SITE_OTHER): Payer: Self-pay | Admitting: Gastroenterology

## 2021-04-03 DIAGNOSIS — K219 Gastro-esophageal reflux disease without esophagitis: Secondary | ICD-10-CM

## 2021-04-06 NOTE — Telephone Encounter (Signed)
Last seen by Thayer Headings oct 2021. Does have appt scheduled in February.

## 2021-04-27 ENCOUNTER — Ambulatory Visit (HOSPITAL_COMMUNITY): Payer: Commercial Managed Care - HMO

## 2021-05-06 ENCOUNTER — Ambulatory Visit (HOSPITAL_COMMUNITY): Payer: Commercial Managed Care - HMO

## 2021-05-11 ENCOUNTER — Other Ambulatory Visit: Payer: Self-pay

## 2021-05-11 ENCOUNTER — Ambulatory Visit (HOSPITAL_COMMUNITY)
Admission: RE | Admit: 2021-05-11 | Discharge: 2021-05-11 | Disposition: A | Discharge status: Home / Self Care | Payer: Medicare Other | Source: Ambulatory Visit | Attending: Family Medicine | Admitting: Family Medicine

## 2021-05-11 DIAGNOSIS — Z1231 Encounter for screening mammogram for malignant neoplasm of breast: Secondary | ICD-10-CM | POA: Insufficient documentation

## 2021-06-01 ENCOUNTER — Encounter (INDEPENDENT_AMBULATORY_CARE_PROVIDER_SITE_OTHER): Payer: Self-pay | Admitting: Gastroenterology

## 2021-06-01 ENCOUNTER — Encounter (INDEPENDENT_AMBULATORY_CARE_PROVIDER_SITE_OTHER): Payer: Self-pay

## 2021-06-01 ENCOUNTER — Ambulatory Visit (INDEPENDENT_AMBULATORY_CARE_PROVIDER_SITE_OTHER): Payer: Medicare Other | Admitting: Gastroenterology

## 2021-06-01 ENCOUNTER — Other Ambulatory Visit (INDEPENDENT_AMBULATORY_CARE_PROVIDER_SITE_OTHER): Payer: Self-pay

## 2021-06-01 ENCOUNTER — Other Ambulatory Visit: Payer: Self-pay

## 2021-06-01 VITALS — BP 125/80 | HR 93 | Temp 98.0°F | Ht 61.0 in | Wt 120.0 lb

## 2021-06-01 DIAGNOSIS — Z1321 Encounter for screening for nutritional disorder: Secondary | ICD-10-CM | POA: Diagnosis not present

## 2021-06-01 DIAGNOSIS — R7401 Elevation of levels of liver transaminase levels: Secondary | ICD-10-CM | POA: Insufficient documentation

## 2021-06-01 DIAGNOSIS — K50019 Crohn's disease of small intestine with unspecified complications: Secondary | ICD-10-CM

## 2021-06-01 DIAGNOSIS — K5090 Crohn's disease, unspecified, without complications: Secondary | ICD-10-CM | POA: Insufficient documentation

## 2021-06-01 NOTE — H&P (View-Only) (Signed)
Maylon Peppers, M.D. Gastroenterology & Hepatology [facility] [address] [place], [place] [address]  Primary Care Physician: Carylon Perches, NP [place] [address]  I will communicate my assessment and recommendations to the referring MD via EMR.  Problems: Ileal Crohn's disease status post small bowel resection  History of Present Illness: [name] is a 61 y.o. female with past medical history of ileal Crohn's disease status post small bowel resection, COPD, rheumatoid arthritis, GERD, who presents for follow up of Crohn's disease.  The patient was last seen on 02/07/2020. At that time, the patient was continued on 6-MP 50 mg every day and had evaluation of her diarrhea with CBC, CMP and ESR/CRP.  She reports that she has presented recurrent episodes of abdominal pain in the left side of her abdomen, which started 2 weeks ago. The pain does not go anywhere else. She describes it as sharp pain.  She has presented recurrent episodes of nausea for the last 3 months. Has not been able to eat all type of food as she has had significant episodes of pain in her abdomen. She drinks Ensure and avoids spicy foods.  Patient also reports that she has had intermittent episodes of diarrhea, sometimes she has soft to hard bowel movements. Has 2-3 Bms per day.  She has been taking 6-MP 50 mg qday compliantly, does not need any refills.  Regarding her previous history of medications, she was on Entocort in the past. She was on prednisone for long term previously but has not required any prescription recently.  States she has had two small bowel resections in the past. She had these surgeries in the early 1990s.  Patient reports that she has presented recurrent episodes of joint pain due to rheumatoid arthritis, worse in her hand. She is currently not on any medicine for this at the moment.  Quit smoking 2 years. Does not  drink alcohol.  Takex oxycodone 40 mg 2-3 times a day for joint pain.  The patient denies having any nausea, vomiting, fever, chills, hematochezia, melena, hematemesis, abdominal distention, jaundice, pruritus or weight loss.  Last Colonoscopy: 2017, decreased sphincter tone in the perianal exam, distal ileum had few nonbleeding erosions, single diverticulum in the ascending colon, diverticulosis in the sigmoid colon.  Past Medical History: Past Medical History:  Diagnosis Date   Anorexia 10/01/2010   Arthritis    Chronic pain syndrome    bones and stomach   COPD (chronic obstructive pulmonary disease) ([place])    Crohn's 10/01/2010   Diarrhea 10/01/2010   Emesis 10/01/2010   GERD (gastroesophageal reflux disease)    Lower abdominal pain 10/01/2010   Rectal bleeding 10/01/2010    Past Surgical History: Past Surgical History:  Procedure Laterality Date    Partial Hysterectomy  about 20 years ago   CHOLECYSTECTOMY     COLONOSCOPY  01/2006   October 2007 revealinga ileal ulcers proximal to anastomosis without stricture and a single rectal ulcer.   COLONOSCOPY WITH PROPOFOL N/A 04/02/2016   Procedure: COLONOSCOPY WITH PROPOFOL;  Surgeon: Rogene Houston, MD;  [facility];  Service: Endoscopy;  Laterality: N/A;  1:00   ESOPHAGOGASTRODUODENOSCOPY (EGD) WITH PROPOFOL N/A 11/21/2013   Procedure: ESOPHAGOGASTRODUODENOSCOPY (EGD) WITH PROPOFOL;  Surgeon: Rogene Houston, MD;  Location: [facility];  Service: Endoscopy;  Laterality: N/A;   TOOTH EXTRACTION Left 10/2013   left lower molar    Family History: Family History  Problem Relation Age of  Onset   Dementia Mother    Heart attack Mother        x 2   Heart disease Father    Heart attack Father    Diabetes Sister    Healthy Daughter    Healthy Son    Dementia Maternal Grandmother    Cancer Maternal Grandmother        breast    Social History: Social History   Tobacco Use  Smoking Status Former   Packs/day: 0.25    Years: 35.00   Pack years: 8.75   Types: Cigarettes   Quit date: 11/29/2015   Years since quitting: 5.5  Smokeless Tobacco Never   Social History   Substance and Sexual Activity  Alcohol Use No   Social History   Substance and Sexual Activity  Drug Use No    Allergies: No Known Allergies  Medications: Current Outpatient Medications  Medication Sig Dispense Refill   ALPRAZolam (XANAX) 0.5 MG tablet Take 0.5 mg by mouth at bedtime as needed for sleep.     Calcium Carbonate-Vitamin D (CALCIUM 500 + D PO) Take by mouth daily.     mercaptopurine (PURINETHOL) 50 MG tablet TAKE 1 TABLET BY MOUTH ONCE DAILY; TAKE 1 HOUR BEFORE OR 2 HOURS AFTER MEALS. 30 tablet 0   Multiple Vitamin (MULTIVITAMIN WITH MINERALS) TABS tablet Take 1 tablet by mouth daily.     oxyCODONE (OXYCONTIN) 40 MG 12 hr tablet Take 40-120 mg by mouth 3 (three) times daily as needed for pain (depends on pain level if takes 40 mg -120 mg).     OXYCONTIN 40 MG 12 hr tablet SMARTSIG:1 Tablet(s) By Mouth Every 8-12 Hours PRN     pantoprazole (PROTONIX) 40 MG tablet TAKE ONE TABLET BY MOUTH ONCE DAILY. 90 tablet 0   No current facility-administered medications for this visit.    Review of Systems: GENERAL: negative for malaise, night sweats HEENT: No changes in hearing or vision, no nose bleeds or other nasal problems. NECK: Negative for lumps, goiter, pain and significant neck swelling RESPIRATORY: Negative for cough, wheezing CARDIOVASCULAR: Negative for chest pain, leg swelling, palpitations, orthopnea GI: SEE HPI MUSCULOSKELETAL: Negative for joint pain or swelling, back pain, and muscle pain. SKIN: Negative for lesions, rash PSYCH: Negative for sleep disturbance, mood disorder and recent psychosocial stressors. HEMATOLOGY Negative for prolonged bleeding, bruising easily, and swollen nodes. ENDOCRINE: Negative for cold or heat intolerance, polyuria, polydipsia and goiter. NEURO: negative for tremor, gait  imbalance, syncope and seizures. The remainder of the review of systems is noncontributory.   Physical Exam: BP 125/80 (BP Location: Left Arm, Patient Position: Sitting, Cuff Size: Large)    Pulse 93    Temp 98 F (36.7 C) (Oral)    Ht 5' 1"  (1.549 m)    Wt 120 lb (54.4 kg)    BMI 22.67 kg/m  GENERAL: The patient is AO x3, in no acute distress. HEENT: Head is normocephalic and atraumatic. EOMI are intact. Mouth is well hydrated and without lesions. NECK: Supple. No masses LUNGS: Clear to auscultation. No presence of rhonchi/wheezing/rales. Adequate chest expansion HEART: RRR, normal s1 and s2. ABDOMEN: Mildly tender upon palpation of the left side of the abdomen, no guarding, no peritoneal signs, and nondistended. BS +. No masses. EXTREMITIES: Without any cyanosis, clubbing, rash, lesions or edema. NEUROLOGIC: AOx3, no focal motor deficit. SKIN: no jaundice, no rashes  Imaging/Labs: as above  I personally reviewed and interpreted the available labs, imaging and endoscopic files.  Impression and  Plan: [name] is a 61 y.o. female with past medical history of ileal Crohn's disease status post small bowel resection, COPD, rheumatoid arthritis, GERD, who presents for follow up of Crohn's disease.  The patient has been receiving treatment with 6-MP chronically for multiple years.  Has presented chronic loose bowel movements recently presented worsening of her abdominal pain.  At this point, it is unclear to me if her symptoms are related to uncontrolled Crohn's disease or due to other ongoing gastrointestinal problems.  She is a high risk patient given the history of small bowel resections in the past x2 and former smoking (I congratulated her for quitting).  For now, we will continue the same doses of 6-MP but we will check multiple labs for surveillance of her disease and to evaluate the persistently elevated AST.  We will also proceed with a colonoscopy and a CT enterography to assess the  severity and activity of her disease.  She agreed and understood.  -Check CBC, CMP, INR, CRP, M40, folic acid, iron stores, QuantiFERON, hepatitis B surface antigen and vitamin D levels -Schedule colonoscopy -Schedule CT enterography -Continue 6-MP 50 mg every day. - RTC 2 months  All questions were answered.      Harvel Quale, MD Gastroenterology and Hepatology [facility]

## 2021-06-01 NOTE — Patient Instructions (Addendum)
Schedule colonoscopy Schedule CT enterography with IV contrast Perform blood workup Continue 6-MP 50 mg qday

## 2021-06-01 NOTE — Progress Notes (Signed)
Maylon Peppers, M.D. Gastroenterology & Hepatology [facility] [address] [place], [place] [address]  Primary Care Physician: Carylon Perches, NP [place] [address]  I will communicate my assessment and recommendations to the referring MD via EMR.  Problems: Ileal Crohn's disease status post small bowel resection  History of Present Illness: [name] is a 61 y.o. female with past medical history of ileal Crohn's disease status post small bowel resection, COPD, rheumatoid arthritis, GERD, who presents for follow up of Crohn's disease.  The patient was last seen on 02/07/2020. At that time, the patient was continued on 6-MP 50 mg every day and had evaluation of her diarrhea with CBC, CMP and ESR/CRP.  She reports that she has presented recurrent episodes of abdominal pain in the left side of her abdomen, which started 2 weeks ago. The pain does not go anywhere else. She describes it as sharp pain.  She has presented recurrent episodes of nausea for the last 3 months. Has not been able to eat all type of food as she has had significant episodes of pain in her abdomen. She drinks Ensure and avoids spicy foods.  Patient also reports that she has had intermittent episodes of diarrhea, sometimes she has soft to hard bowel movements. Has 2-3 Bms per day.  She has been taking 6-MP 50 mg qday compliantly, does not need any refills.  Regarding her previous history of medications, she was on Entocort in the past. She was on prednisone for long term previously but has not required any prescription recently.  States she has had two small bowel resections in the past. She had these surgeries in the early 1990s.  Patient reports that she has presented recurrent episodes of joint pain due to rheumatoid arthritis, worse in her hand. She is currently not on any medicine for this at the moment.  Quit smoking 2 years. Does not  drink alcohol.  Takex oxycodone 40 mg 2-3 times a day for joint pain.  The patient denies having any nausea, vomiting, fever, chills, hematochezia, melena, hematemesis, abdominal distention, jaundice, pruritus or weight loss.  Last Colonoscopy: 2017, decreased sphincter tone in the perianal exam, distal ileum had few nonbleeding erosions, single diverticulum in the ascending colon, diverticulosis in the sigmoid colon.  Past Medical History: Past Medical History:  Diagnosis Date   Anorexia 10/01/2010   Arthritis    Chronic pain syndrome    bones and stomach   COPD (chronic obstructive pulmonary disease) ([place])    Crohn's 10/01/2010   Diarrhea 10/01/2010   Emesis 10/01/2010   GERD (gastroesophageal reflux disease)    Lower abdominal pain 10/01/2010   Rectal bleeding 10/01/2010    Past Surgical History: Past Surgical History:  Procedure Laterality Date    Partial Hysterectomy  about 20 years ago   CHOLECYSTECTOMY     COLONOSCOPY  01/2006   October 2007 revealinga ileal ulcers proximal to anastomosis without stricture and a single rectal ulcer.   COLONOSCOPY WITH PROPOFOL N/A 04/02/2016   Procedure: COLONOSCOPY WITH PROPOFOL;  Surgeon: Rogene Houston, MD;  Location: [facility];  Service: Endoscopy;  Laterality: N/A;  1:00   ESOPHAGOGASTRODUODENOSCOPY (EGD) WITH PROPOFOL N/A 11/21/2013   Procedure: ESOPHAGOGASTRODUODENOSCOPY (EGD) WITH PROPOFOL;  Surgeon: Rogene Houston, MD;  Location: [facility] ORS;  Service: Endoscopy;  Laterality: N/A;   TOOTH EXTRACTION Left 10/2013   left lower molar    Family History: Family History  Problem Relation Age of  Onset   Dementia Mother    Heart attack Mother        x 2   Heart disease Father    Heart attack Father    Diabetes Sister    Healthy Daughter    Healthy Son    Dementia Maternal Grandmother    Cancer Maternal Grandmother        breast    Social History: Social History   Tobacco Use  Smoking Status Former   Packs/day: 0.25    Years: 35.00   Pack years: 8.75   Types: Cigarettes   Quit date: 11/29/2015   Years since quitting: 5.5  Smokeless Tobacco Never   Social History   Substance and Sexual Activity  Alcohol Use No   Social History   Substance and Sexual Activity  Drug Use No    Allergies: No Known Allergies  Medications: Current Outpatient Medications  Medication Sig Dispense Refill   ALPRAZolam (XANAX) 0.5 MG tablet Take 0.5 mg by mouth at bedtime as needed for sleep.     Calcium Carbonate-Vitamin D (CALCIUM 500 + D PO) Take by mouth daily.     mercaptopurine (PURINETHOL) 50 MG tablet TAKE 1 TABLET BY MOUTH ONCE DAILY; TAKE 1 HOUR BEFORE OR 2 HOURS AFTER MEALS. 30 tablet 0   Multiple Vitamin (MULTIVITAMIN WITH MINERALS) TABS tablet Take 1 tablet by mouth daily.     oxyCODONE (OXYCONTIN) 40 MG 12 hr tablet Take 40-120 mg by mouth 3 (three) times daily as needed for pain (depends on pain level if takes 40 mg -120 mg).     OXYCONTIN 40 MG 12 hr tablet SMARTSIG:1 Tablet(s) By Mouth Every 8-12 Hours PRN     pantoprazole (PROTONIX) 40 MG tablet TAKE ONE TABLET BY MOUTH ONCE DAILY. 90 tablet 0   No current facility-administered medications for this visit.    Review of Systems: GENERAL: negative for malaise, night sweats HEENT: No changes in hearing or vision, no nose bleeds or other nasal problems. NECK: Negative for lumps, goiter, pain and significant neck swelling RESPIRATORY: Negative for cough, wheezing CARDIOVASCULAR: Negative for chest pain, leg swelling, palpitations, orthopnea GI: SEE HPI MUSCULOSKELETAL: Negative for joint pain or swelling, back pain, and muscle pain. SKIN: Negative for lesions, rash PSYCH: Negative for sleep disturbance, mood disorder and recent psychosocial stressors. HEMATOLOGY Negative for prolonged bleeding, bruising easily, and swollen nodes. ENDOCRINE: Negative for cold or heat intolerance, polyuria, polydipsia and goiter. NEURO: negative for tremor, gait  imbalance, syncope and seizures. The remainder of the review of systems is noncontributory.   Physical Exam: BP 125/80 (BP Location: Left Arm, Patient Position: Sitting, Cuff Size: Large)    Pulse 93    Temp 98 F (36.7 C) (Oral)    Ht 5' 1"  (1.549 m)    Wt 120 lb (54.4 kg)    BMI 22.67 kg/m  GENERAL: The patient is AO x3, in no acute distress. HEENT: Head is normocephalic and atraumatic. EOMI are intact. Mouth is well hydrated and without lesions. NECK: Supple. No masses LUNGS: Clear to auscultation. No presence of rhonchi/wheezing/rales. Adequate chest expansion HEART: RRR, normal s1 and s2. ABDOMEN: Mildly tender upon palpation of the left side of the abdomen, no guarding, no peritoneal signs, and nondistended. BS +. No masses. EXTREMITIES: Without any cyanosis, clubbing, rash, lesions or edema. NEUROLOGIC: AOx3, no focal motor deficit. SKIN: no jaundice, no rashes  Imaging/Labs: as above  I personally reviewed and interpreted the available labs, imaging and endoscopic files.  Impression and  Plan: [name] is a 61 y.o. female with past medical history of ileal Crohn's disease status post small bowel resection, COPD, rheumatoid arthritis, GERD, who presents for follow up of Crohn's disease.  The patient has been receiving treatment with 6-MP chronically for multiple years.  Has presented chronic loose bowel movements recently presented worsening of her abdominal pain.  At this point, it is unclear to me if her symptoms are related to uncontrolled Crohn's disease or due to other ongoing gastrointestinal problems.  She is a high risk patient given the history of small bowel resections in the past x2 and former smoking (I congratulated her for quitting).  For now, we will continue the same doses of 6-MP but we will check multiple labs for surveillance of her disease and to evaluate the persistently elevated AST.  We will also proceed with a colonoscopy and a CT enterography to assess the  severity and activity of her disease.  She agreed and understood.  -Check CBC, CMP, INR, CRP, I50, folic acid, iron stores, QuantiFERON, hepatitis B surface antigen and vitamin D levels -Schedule colonoscopy -Schedule CT enterography -Continue 6-MP 50 mg every day. - RTC 2 months  All questions were answered.      Harvel Quale, MD Gastroenterology and Hepatology [facility]

## 2021-06-02 ENCOUNTER — Encounter (INDEPENDENT_AMBULATORY_CARE_PROVIDER_SITE_OTHER): Payer: Self-pay

## 2021-06-03 ENCOUNTER — Other Ambulatory Visit (INDEPENDENT_AMBULATORY_CARE_PROVIDER_SITE_OTHER): Payer: Self-pay | Admitting: Gastroenterology

## 2021-06-03 DIAGNOSIS — R7989 Other specified abnormal findings of blood chemistry: Secondary | ICD-10-CM

## 2021-06-03 DIAGNOSIS — R748 Abnormal levels of other serum enzymes: Secondary | ICD-10-CM

## 2021-06-04 ENCOUNTER — Telehealth (INDEPENDENT_AMBULATORY_CARE_PROVIDER_SITE_OTHER): Payer: Self-pay | Admitting: *Deleted

## 2021-06-04 ENCOUNTER — Other Ambulatory Visit (INDEPENDENT_AMBULATORY_CARE_PROVIDER_SITE_OTHER): Payer: Self-pay | Admitting: Gastroenterology

## 2021-06-04 DIAGNOSIS — R7989 Other specified abnormal findings of blood chemistry: Secondary | ICD-10-CM

## 2021-06-04 DIAGNOSIS — K5000 Crohn's disease of small intestine without complications: Secondary | ICD-10-CM

## 2021-06-04 DIAGNOSIS — K219 Gastro-esophageal reflux disease without esophagitis: Secondary | ICD-10-CM

## 2021-06-04 MED ORDER — DICYCLOMINE HCL 10 MG PO CAPS: 10.0000 mg | ORAL_CAPSULE | Freq: Two times a day (BID) | ORAL | 2 refills | Status: DC | PRN

## 2021-06-04 MED ORDER — PANTOPRAZOLE SODIUM 40 MG PO TBEC: 40.0000 mg | DELAYED_RELEASE_TABLET | Freq: Every day | ORAL | 3 refills | Status: DC

## 2021-06-04 MED ORDER — CHOLECALCIFEROL 25 MCG (1000 UT) PO CHEW: 1.0000 | CHEWABLE_TABLET | Freq: Every day | ORAL | 3 refills | Status: DC

## 2021-06-04 MED ORDER — MERCAPTOPURINE 50 MG PO TABS: ORAL_TABLET | ORAL | 0 refills | Status: DC

## 2021-06-04 NOTE — Telephone Encounter (Signed)
Pt notified on voicemail

## 2021-06-04 NOTE — Telephone Encounter (Signed)
Pt states she was suppose to get a muscle relaxer for stomach cramps, and vit D rx. ( I read note and it says prescription for vit D 1,000 units daily - does she just get this otc) also needs refill on pantoprazole and mercaptopurine.   [place] [facility]  [phone]

## 2021-06-04 NOTE — Telephone Encounter (Signed)
Medications sent

## 2021-06-05 LAB — COMPREHENSIVE METABOLIC PANEL
AG Ratio: 1.6 (calc) (ref 1.0–2.5)
ALT: 30 U/L — ABNORMAL HIGH (ref 6–29)
AST: 56 U/L — ABNORMAL HIGH (ref 10–35)
Albumin: 4.2 g/dL (ref 3.6–5.1)
Alkaline phosphatase (APISO): 133 U/L (ref 37–153)
BUN/Creatinine Ratio: 7 (calc) (ref 6–22)
BUN: 5 mg/dL — ABNORMAL LOW (ref 7–25)
CO2: 28 mmol/L (ref 20–32)
Calcium: 9.3 mg/dL (ref 8.6–10.4)
Chloride: 103 mmol/L (ref 98–110)
Creat: 0.74 mg/dL (ref 0.50–1.05)
Globulin: 2.7 g/dL (calc) (ref 1.9–3.7)
Glucose, Bld: 84 mg/dL (ref 65–99)
Potassium: 3.9 mmol/L (ref 3.5–5.3)
Sodium: 139 mmol/L (ref 135–146)
Total Bilirubin: 0.9 mg/dL (ref 0.2–1.2)
Total Protein: 6.9 g/dL (ref 6.1–8.1)

## 2021-06-05 LAB — QUANTIFERON-TB GOLD PLUS
Mitogen-NIL: >10 IU/mL
NIL: 0.03 IU/mL
QuantiFERON-TB Gold Plus: NEGATIVE
TB1-NIL: 0 IU/mL
TB2-NIL: 0 IU/mL

## 2021-06-05 LAB — CBC WITH DIFFERENTIAL/PLATELET
Absolute Monocytes: 419 cells/uL (ref 200–950)
Basophils Absolute: 30 cells/uL (ref 0–200)
Basophils Relative: 0.5 %
Eosinophils Absolute: 71 cells/uL (ref 15–500)
Eosinophils Relative: 1.2 %
HCT: 37.3 % (ref 35.0–45.0)
Hemoglobin: 13.2 g/dL (ref 11.7–15.5)
Lymphs Abs: 1286 cells/uL (ref 850–3900)
MCH: 36.6 pg — ABNORMAL HIGH (ref 27.0–33.0)
MCHC: 35.4 g/dL (ref 32.0–36.0)
MCV: 103.3 fL — ABNORMAL HIGH (ref 80.0–100.0)
MPV: 12.1 fL (ref 7.5–12.5)
Monocytes Relative: 7.1 %
Neutro Abs: 4095 cells/uL (ref 1500–7800)
Neutrophils Relative %: 69.4 %
Platelets: 129 10*3/uL — ABNORMAL LOW (ref 140–400)
RBC: 3.61 10*6/uL — ABNORMAL LOW (ref 3.80–5.10)
RDW: 14.5 % (ref 11.0–15.0)
Total Lymphocyte: 21.8 %
WBC: 5.9 10*3/uL (ref 3.8–10.8)

## 2021-06-05 LAB — C-REACTIVE PROTEIN: CRP: 5 mg/L (ref <8.0)

## 2021-06-05 LAB — PROTIME-INR
INR: 1.1
Prothrombin Time: 11.6 s — ABNORMAL HIGH (ref 9.0–11.5)

## 2021-06-05 LAB — FERRITIN: Ferritin: 70 ng/mL (ref 16–288)

## 2021-06-05 LAB — HEPATITIS B SURFACE ANTIGEN: Hepatitis B Surface Ag: NONREACTIVE

## 2021-06-05 LAB — IRON, TOTAL/TOTAL IRON BINDING CAP
%SAT: 36 % (calc) (ref 16–45)
Iron: 108 ug/dL (ref 45–160)
TIBC: 301 mcg/dL (calc) (ref 250–450)

## 2021-06-05 LAB — VITAMIN D 25 HYDROXY (VIT D DEFICIENCY, FRACTURES): Vit D, 25-Hydroxy: 26 ng/mL — ABNORMAL LOW (ref 30–100)

## 2021-06-05 LAB — B12 AND FOLATE PANEL
Folate: >24 ng/mL
Vitamin B-12: 336 pg/mL (ref 200–1100)

## 2021-06-16 NOTE — Patient Instructions (Signed)
? ? ? ? ? ? Lyndon Code ? 06/16/2021  ?  ? @PREFPERIOPPHARMACY @ ? ? Your procedure is scheduled on  06/23/2021. ? ? Report to [facility] at  [place]  A.M. ? ? Call this number if you have problems the morning of surgery: ? [phone] ? ? Remember: ? Follow the diet and prep instructions given to you by the office. ?  ? Take these medicines the morning of surgery with A SIP OF WATER  ? ?   xanax(if needed), purinethol, oxycodone(if needed), protonix. ?  ? ? Do not wear jewelry, make-up or nail polish. ? Do not wear lotions, powders, or perfumes, or deodorant. ? Do not shave 48 hours prior to surgery.  Men may shave face and neck. ? Do not bring valuables to the hospital. ? [facility] is not responsible for any belongings or valuables. ? ?Contacts, dentures or bridgework may not be worn into surgery.  Leave your suitcase in the car.  After surgery it may be brought to your room. ? ?For patients admitted to the hospital, discharge time will be determined by your treatment team. ? ?Patients discharged the day of surgery will not be allowed to drive home and must  have someone with them for 24 hours.  ? ? ?Special instructions:   DO NOT smoke tobacco or vape for 24 hours before your procedure. ? ? ?Please read over the following fact sheets that you were given. ?Anesthesia Post-op Instructions and Care and Recovery After Surgery ?  ? ? ? Colonoscopy, Adult, Care After ?This sheet gives you information about how to care for yourself after your procedure. Your health care provider may also give you more specific instructions. If you have problems or questions, contact your health care provider. ?What can I expect after the procedure? ?After the procedure, it is common to have: ?A small amount of blood in your stool for 24 hours after the procedure. ?Some gas. ?Mild cramping or bloating of your abdomen. ?Follow these instructions at home: ?Eating and drinking ? ?Drink enough fluid to keep your urine pale yellow. ?Follow  instructions from your health care provider about eating or drinking restrictions. ?Resume your normal diet as instructed by your health care provider. Avoid heavy or fried foods that are hard to digest. ?Activity ?Rest as told by your health care provider. ?Avoid sitting for a long time without moving. Get up to take short walks every 1-2 hours. This is important to improve blood flow and breathing. Ask for help if you feel weak or unsteady. ?Return to your normal activities as told by your health care provider. Ask your health care provider what activities are safe for you. ?Managing cramping and bloating ? ?Try walking around when you have cramps or feel bloated. ?Apply heat to your abdomen as told by your health care provider. Use the heat source that your health care provider recommends, such as a moist heat pack or a heating pad. ?Place a towel between your skin and the heat source. ?Leave the heat on for 20-30 minutes. ?Remove the heat if your skin turns bright red. This is especially important if you are unable to feel pain, heat, or cold. You may have a greater risk of getting burned. ?General instructions ?If you were given a sedative during the procedure, it can affect you for several hours. Do not drive or operate machinery until your health care provider says that it is safe. ?For the first 24 hours after the procedure: ?Do  not sign important documents. ?Do not drink alcohol. ?Do your regular daily activities at a slower pace than normal. ?Eat soft foods that are easy to digest. ?Take over-the-counter and prescription medicines only as told by your health care provider. ?Keep all follow-up visits as told by your health care provider. This is important. ?Contact a health care provider if: ?You have blood in your stool 2-3 days after the procedure. ?Get help right away if you have: ?More than a small spotting of blood in your stool. ?Large blood clots in your stool. ?Swelling of your abdomen. ?Nausea or  vomiting. ?A fever. ?Increasing pain in your abdomen that is not relieved with medicine. ?Summary ?After the procedure, it is common to have a small amount of blood in your stool. You may also have mild cramping and bloating of your abdomen. ?If you were given a sedative during the procedure, it can affect you for several hours. Do not drive or operate machinery until your health care provider says that it is safe. ?Get help right away if you have a lot of blood in your stool, nausea or vomiting, a fever, or increased pain in your abdomen. ?This information is not intended to replace advice given to you by your health care provider. Make sure you discuss any questions you have with your health care provider. ?Document Revised: 02/09/2019 Document Reviewed: 10/30/2018 ?Elsevier Patient Education ? [place]. ?Monitored Anesthesia Care, Care After ?This sheet gives you information about how to care for yourself after your procedure. Your health care provider may also give you more specific instructions. If you have problems or questions, contact your health care provider. ?What can I expect after the procedure? ?After the procedure, it is common to have: ?Tiredness. ?Forgetfulness about what happened after the procedure. ?Impaired judgment for important decisions. ?Nausea or vomiting. ?Some difficulty with balance. ?Follow these instructions at home: ?For the time period you were told by your health care provider: ?  ?Rest as needed. ?Do not participate in activities where you could fall or become injured. ?Do not drive or use machinery. ?Do not drink alcohol. ?Do not take sleeping pills or medicines that cause drowsiness. ?Do not make important decisions or sign legal documents. ?Do not take care of children on your own. ?Eating and drinking ?Follow the diet that is recommended by your health care provider. ?Drink enough fluid to keep your urine pale yellow. ?If you vomit: ?Drink water, juice, or soup when  you can drink without vomiting. ?Make sure you have little or no nausea before eating solid foods. ?General instructions ?Have a responsible adult stay with you for the time you are told. It is important to have someone help care for you until you are awake and alert. ?Take over-the-counter and prescription medicines only as told by your health care provider. ?If you have sleep apnea, surgery and certain medicines can increase your risk for breathing problems. Follow instructions from your health care provider about wearing your sleep device: ?Anytime you are sleeping, including during daytime naps. ?While taking prescription pain medicines, sleeping medicines, or medicines that make you drowsy. ?Avoid smoking. ?Keep all follow-up visits as told by your health care provider. This is important. ?Contact a health care provider if: ?You keep feeling nauseous or you keep vomiting. ?You feel light-headed. ?You are still sleepy or having trouble with balance after 24 hours. ?You develop a rash. ?You have a fever. ?You have redness or swelling around the IV site. ?Get help right away if: ?  You have trouble breathing. ?You have new-onset confusion at home. ?Summary ?For several hours after your procedure, you may feel tired. You may also be forgetful and have poor judgment. ?Have a responsible adult stay with you for the time you are told. It is important to have someone help care for you until you are awake and alert. ?Rest as told. Do not drive or operate machinery. Do not drink alcohol or take sleeping pills. ?Get help right away if you have trouble breathing, or if you suddenly become confused. ?This information is not intended to replace advice given to you by your health care provider. Make sure you discuss any questions you have with your health care provider. ?Document Revised: 12/20/2019 Document Reviewed: 03/08/2019 ?Elsevier Patient Education ? [place]. ? ?

## 2021-06-18 ENCOUNTER — Encounter (HOSPITAL_COMMUNITY)
Admission: RE | Admit: 2021-06-18 | Discharge: 2021-06-18 | Disposition: A | Discharge status: Home / Self Care | Payer: Medicare Other | Source: Ambulatory Visit | Attending: Gastroenterology | Admitting: Gastroenterology

## 2021-06-18 ENCOUNTER — Encounter (HOSPITAL_COMMUNITY): Payer: Self-pay

## 2021-06-18 DIAGNOSIS — K50019 Crohn's disease of small intestine with unspecified complications: Secondary | ICD-10-CM

## 2021-06-18 HISTORY — DX: Acute pancreatitis without necrosis or infection, unspecified: K85.90

## 2021-06-19 ENCOUNTER — Other Ambulatory Visit (HOSPITAL_COMMUNITY)
Admission: RE | Admit: 2021-06-19 | Discharge: 2021-06-19 | Disposition: A | Discharge status: Home / Self Care | Payer: Medicare Other | Source: Ambulatory Visit | Attending: Gastroenterology | Admitting: Gastroenterology

## 2021-06-19 DIAGNOSIS — Z01812 Encounter for preprocedural laboratory examination: Secondary | ICD-10-CM | POA: Diagnosis present

## 2021-06-19 DIAGNOSIS — K50019 Crohn's disease of small intestine with unspecified complications: Secondary | ICD-10-CM | POA: Diagnosis not present

## 2021-06-19 DIAGNOSIS — R7989 Other specified abnormal findings of blood chemistry: Secondary | ICD-10-CM | POA: Insufficient documentation

## 2021-06-19 LAB — HEPATITIS PANEL, ACUTE
HCV Ab: NONREACTIVE
Hep A IgM: NONREACTIVE
Hep B C IgM: NONREACTIVE
Hepatitis B Surface Ag: NONREACTIVE

## 2021-06-20 LAB — IGG: IgG (Immunoglobin G), Serum: 992 mg/dL (ref 586–1602)

## 2021-06-20 LAB — CERULOPLASMIN: Ceruloplasmin: 19.1 mg/dL (ref 19.0–39.0)

## 2021-06-20 LAB — ANA W/REFLEX IF POSITIVE: Anti Nuclear Antibody (ANA): NEGATIVE

## 2021-06-21 LAB — ANTI-SMOOTH MUSCLE ANTIBODY, IGG: F-Actin IgG: 9 Units (ref 0–19)

## 2021-06-22 ENCOUNTER — Telehealth (INDEPENDENT_AMBULATORY_CARE_PROVIDER_SITE_OTHER): Payer: Self-pay

## 2021-06-22 MED ORDER — PEG 3350-KCL-NA BICARB-NACL 420 G PO SOLR: 4000.0000 mL | ORAL | 0 refills | Status: DC

## 2021-06-22 NOTE — Telephone Encounter (Signed)
Vaishnav Demartin Ann Labrea Eccleston, CMA  ?

## 2021-06-23 ENCOUNTER — Encounter (HOSPITAL_COMMUNITY)
Admission: RE | Disposition: A | Discharge status: Home / Self Care | Payer: Self-pay | Source: Home / Self Care | Attending: Gastroenterology

## 2021-06-23 ENCOUNTER — Encounter (INDEPENDENT_AMBULATORY_CARE_PROVIDER_SITE_OTHER): Payer: Self-pay | Admitting: *Deleted

## 2021-06-23 ENCOUNTER — Encounter (HOSPITAL_COMMUNITY): Payer: Self-pay | Admitting: Gastroenterology

## 2021-06-23 ENCOUNTER — Ambulatory Visit (HOSPITAL_COMMUNITY): Payer: Medicare Other | Admitting: Certified Registered"

## 2021-06-23 ENCOUNTER — Ambulatory Visit (HOSPITAL_COMMUNITY)
Admission: RE | Admit: 2021-06-23 | Discharge: 2021-06-23 | Disposition: A | Discharge status: Home / Self Care | Payer: Medicare Other | Attending: Gastroenterology | Admitting: Gastroenterology

## 2021-06-23 ENCOUNTER — Ambulatory Visit (HOSPITAL_BASED_OUTPATIENT_CLINIC_OR_DEPARTMENT_OTHER): Payer: Medicare Other | Admitting: Certified Registered"

## 2021-06-23 DIAGNOSIS — J449 Chronic obstructive pulmonary disease, unspecified: Secondary | ICD-10-CM | POA: Insufficient documentation

## 2021-06-23 DIAGNOSIS — Z980 Intestinal bypass and anastomosis status: Secondary | ICD-10-CM | POA: Diagnosis not present

## 2021-06-23 DIAGNOSIS — Z87891 Personal history of nicotine dependence: Secondary | ICD-10-CM | POA: Diagnosis not present

## 2021-06-23 DIAGNOSIS — R7989 Other specified abnormal findings of blood chemistry: Secondary | ICD-10-CM | POA: Diagnosis not present

## 2021-06-23 DIAGNOSIS — K50012 Crohn's disease of small intestine with intestinal obstruction: Secondary | ICD-10-CM

## 2021-06-23 DIAGNOSIS — K56699 Other intestinal obstruction unspecified as to partial versus complete obstruction: Secondary | ICD-10-CM | POA: Diagnosis not present

## 2021-06-23 DIAGNOSIS — K219 Gastro-esophageal reflux disease without esophagitis: Secondary | ICD-10-CM | POA: Diagnosis not present

## 2021-06-23 DIAGNOSIS — Z09 Encounter for follow-up examination after completed treatment for conditions other than malignant neoplasm: Secondary | ICD-10-CM | POA: Diagnosis not present

## 2021-06-23 DIAGNOSIS — M069 Rheumatoid arthritis, unspecified: Secondary | ICD-10-CM | POA: Diagnosis not present

## 2021-06-23 DIAGNOSIS — G709 Myoneural disorder, unspecified: Secondary | ICD-10-CM | POA: Diagnosis not present

## 2021-06-23 DIAGNOSIS — K50019 Crohn's disease of small intestine with unspecified complications: Secondary | ICD-10-CM

## 2021-06-23 HISTORY — PX: COLONOSCOPY WITH PROPOFOL: SHX5780

## 2021-06-23 HISTORY — PX: BIOPSY: SHX5522

## 2021-06-23 LAB — HM COLONOSCOPY

## 2021-06-23 LAB — HEPATITIS PANEL, ACUTE
HCV Ab: NONREACTIVE
Hep A IgM: NONREACTIVE
Hep B C IgM: NONREACTIVE
Hepatitis B Surface Ag: NONREACTIVE

## 2021-06-23 SURGERY — COLONOSCOPY WITH PROPOFOL
Anesthesia: General

## 2021-06-23 MED ORDER — PROPOFOL 10 MG/ML IV BOLUS
INTRAVENOUS | Status: DC | PRN
  Administered 2021-06-23: 20 mg via INTRAVENOUS
  Administered 2021-06-23: 30 mg via INTRAVENOUS
  Administered 2021-06-23: 50 mg via INTRAVENOUS

## 2021-06-23 MED ORDER — LIDOCAINE 2% (20 MG/ML) 5 ML SYRINGE
INTRAMUSCULAR | Status: DC | PRN
  Administered 2021-06-23: 50 mg via INTRAVENOUS

## 2021-06-23 MED ORDER — PROPOFOL 500 MG/50ML IV EMUL
INTRAVENOUS | Status: DC | PRN
  Administered 2021-06-23: 200 ug/kg/min via INTRAVENOUS

## 2021-06-23 MED ORDER — PREDNISONE 5 MG PO TABS: ORAL_TABLET | ORAL | 0 refills | Status: AC

## 2021-06-23 MED ORDER — LACTATED RINGERS IV SOLN: INTRAVENOUS | Status: DC

## 2021-06-23 NOTE — Interval H&P Note (Signed)
History and Physical Interval Note: ? ?06/23/2021 ?7:30 AM ? ?[name]  has presented today for surgery, with the diagnosis of Crohns Disease.  The various methods of treatment have been discussed with the patient and family. After consideration of risks, benefits and other options for treatment, the patient has consented to  Procedure(s) with comments: ?COLONOSCOPY WITH PROPOFOL (N/A) - 805 ASA 1 as a surgical intervention.  The patient's history has been reviewed, patient examined, no change in status, stable for surgery.  I have reviewed the patient's chart and labs.  Questions were answered to the patient's satisfaction.   ? ? ?[name] ? ? ?

## 2021-06-23 NOTE — Discharge Instructions (Signed)
You are being discharged to home.  ?Resume your previous diet.  ?We are waiting for your pathology results.  ?Your physician has recommended a repeat colonoscopy depending on clinical course for surveillance.  ?Proceed with scheduled CT enterography. ?Continue avoiding cigarrette smoking. ?Start prednisone taper 40 mg . ?

## 2021-06-23 NOTE — Anesthesia Preprocedure Evaluation (Signed)
Anesthesia Evaluation  ?Patient identified by MRN, date of birth, ID band ?Patient awake ? ? ? ?Reviewed: ?Allergy & Precautions, H&P , NPO status , Patient's Chart, lab work & pertinent test results, reviewed documented beta blocker date and time  ? ?Airway ?Mallampati: II ? ?TM Distance: >3 FB ?Neck ROM: full ? ? ? Dental ?no notable dental hx. ? ?  ?Pulmonary ?COPD, former smoker,  ?  ?Pulmonary exam normal ?breath sounds clear to auscultation ? ? ? ? ? ? Cardiovascular ?Exercise Tolerance: Good ?negative cardio ROS ? ? ?Rhythm:regular Rate:Normal ? ? ?  ?Neuro/Psych ? Neuromuscular disease negative psych ROS  ? GI/Hepatic ?Neg liver ROS, GERD  Medicated,  ?Endo/Other  ?negative endocrine ROS ? Renal/GU ?negative Renal ROS  ?negative genitourinary ?  ?Musculoskeletal ? ? Abdominal ?  ?Peds ? Hematology ?negative hematology ROS ?(+)   ?Anesthesia Other Findings ? ? Reproductive/Obstetrics ?negative OB ROS ? ?  ? ? ? ? ? ? ? ? ? ? ? ? ? ?  ?  ? ? ? ? ? ? ? ? ?Anesthesia Physical ?Anesthesia Plan ? ?ASA: 3 ? ?Anesthesia Plan: General  ? ?Post-op Pain Management:   ? ?Induction:  ? ?PONV Risk Score and Plan: Propofol infusion ? ?Airway Management Planned:  ? ?Additional Equipment:  ? ?Intra-op Plan:  ? ?Post-operative Plan:  ? ?Informed Consent: I have reviewed the patients History and Physical, chart, labs and discussed the procedure including the risks, benefits and alternatives for the proposed anesthesia with the patient or authorized representative who has indicated his/her understanding and acceptance.  ? ? ? ?Dental Advisory Given ? ?Plan Discussed with: CRNA ? ?Anesthesia Plan Comments:   ? ? ? ? ? ? ?Anesthesia Quick Evaluation ? ?

## 2021-06-23 NOTE — Anesthesia Postprocedure Evaluation (Signed)
Anesthesia Post Note ? ?Patient: [name] ? ?Procedure(s) Performed: COLONOSCOPY WITH PROPOFOL ?BIOPSY ? ?Patient location during evaluation: Phase II ?Anesthesia Type: General ?Level of consciousness: awake ?Pain management: pain level controlled ?Vital Signs Assessment: post-procedure vital signs reviewed and stable ?Respiratory status: spontaneous breathing and respiratory function stable ?Cardiovascular status: blood pressure returned to baseline and stable ?Postop Assessment: no headache and no apparent nausea or vomiting ?Anesthetic complications: no ?Comments: Late entry ? ? ?No notable events documented. ? ? ?Last Vitals:  ?Vitals:  ? 06/23/21 0741 06/23/21 0816  ?BP:  (!) 90/51  ?Pulse: (!) 53 67  ?Resp: (!) 7 14  ?Temp:  36.7 ?C  ?SpO2: 98% 99%  ?  ?Last Pain:  ?Vitals:  ? 06/23/21 0816  ?TempSrc: Oral  ?PainSc: 0-No pain  ? ? ?  ?  ?  ?  ?  ?  ? ?Louann Sjogren ? ? ? ? ?

## 2021-06-23 NOTE — Op Note (Signed)
[facility] ?Patient Name: [name] ?Procedure Date: 06/23/2021 7:41 AM ?MRN: [id number] ?Date of Birth: 14-Dec-1960 ?Attending MD: Maylon Peppers ,  ?CSN: [id number] ?Age: 61 ?Admit Type: Outpatient ?Procedure:                Colonoscopy ?Indications:              Disease activity assessment of Crohn's disease of  ?                          the small bowel ?Providers:                Maylon Peppers, Hughie Closs RN, RN, Caprice Kluver,  ?                          Kristine L. Risa Grill, Technician ?Referring MD:              ?Medicines:                Monitored Anesthesia Care ?Complications:            No immediate complications. ?Estimated Blood Loss:     Estimated blood loss: none. ?Procedure:                Pre-Anesthesia Assessment: ?                          - Prior to the procedure, a History and Physical  ?                          was performed, and patient medications, allergies  ?                          and sensitivities were reviewed. The patient's  ?                          tolerance of previous anesthesia was reviewed. ?                          - The risks and benefits of the procedure and the  ?                          sedation options and risks were discussed with the  ?                          patient. All questions were answered and informed  ?                          consent was obtained. ?                          - ASA Grade Assessment: III - A patient with severe  ?                          systemic disease. ?                          After obtaining informed consent, the colonoscope  ?  was passed under direct vision. Throughout the  ?                          procedure, the patient's blood pressure, pulse, and  ?                          oxygen saturations were monitored continuously. The  ?                          PCF-HQ190L ([id number]) scope was introduced through  ?                          the anus and advanced to the 3 cm into the ileum.  ?                           The colonoscopy was technically difficult and  ?                          complex due to stenosis in neo terminal ileum. The  ?                          patient tolerated the procedure well. The quality  ?                          of the bowel preparation was adequate to identify  ?                          polyps 6 mm and larger in size. ?Scope In: 7:51:46 AM ?Scope Out: 8:12:26 AM ?Scope Withdrawal Time: 0 hours 16 minutes 32 seconds  ?Total Procedure Duration: 0 hours 20 minutes 40 seconds  ?Findings: ?     The perianal and digital rectal examinations were normal. ?     The neo-terminal ileum contained a moderate stenosis measuring 5 mm  ?     (inner diameter) that coul not be traversed. This was located 3 cm from  ?     the anastomosis. Biopsies were taken with a cold forceps for histology. ?     Diffuse inflammation, moderate in severity and characterized by  ?     congestion (edema) and friability was found in the neo-terminal Ileum.  ?     Biopsies were taken with a cold forceps for histology. ?     There was evidence of a prior end-to-side ileo-colonic anastomosis at 60  ?     cm proximal to the anus. This was patent and was characterized by edema  ?     and erythema. The anastomosis was traversed. ?     The colon (entire examined portion) appeared normal. ?     The retroflexed view of the distal rectum and anal verge was normal and  ?     showed no anal or rectal abnormalities. ?Impression:               - Stricture in the neo-terminal ileum. Biopsied. ?                          - Crohn's disease with ileitis. Biopsied. ?                          -  Patent end-to-side ileo-colonic anastomosis,  ?                          characterized by edema and erythema. ?                          - The entire examined colon is normal. ?                          - The distal rectum and anal verge are normal on  ?                          retroflexion view. ?                          - Start prednisone taper 40  mg ?Moderate Sedation: ?     Per Anesthesia Care ?Recommendation:           - Discharge patient to home (ambulatory). ?                          - Resume previous diet. ?                          - Await pathology results. ?                          - Repeat colonoscopy timing will depend on clinical  ?                          course for surveillance. ?                          -Proceed with scheduled CT enterography. ?                          - Continue avoiding cigarrette smoking. ?Procedure Code(s):        --- Professional --- ?                          [phone], Colonoscopy, flexible; with biopsy, single  ?                          or multiple ?Diagnosis Code(s):        --- Professional --- ?                          K50.012, Crohn's disease of small intestine with  ?                          intestinal obstruction ?                          Z98.0, Intestinal bypass and anastomosis status ?CPT copyright 2019 [employer] Association. All rights reserved. ?The codes documented in this report are preliminary and upon coder review may  ?be revised to meet current compliance requirements. ?Maylon Peppers, MD ?Maylon Peppers,  ?06/23/2021 8:24:38 AM ?This report  has been signed electronically. ?Number of Addenda: 0 ?

## 2021-06-23 NOTE — Transfer of Care (Signed)
Immediate Anesthesia Transfer of Care Note ? ?Patient: [name] ? ?Procedure(s) Performed: COLONOSCOPY WITH PROPOFOL ?BIOPSY ? ?Patient Location: Short Stay ? ?Anesthesia Type:MAC ? ?Level of Consciousness: sedated and patient cooperative ? ?Airway & Oxygen Therapy: Patient Spontanous Breathing and Patient connected to nasal cannula oxygen ? ?Post-op Assessment: Report given to RN and Post -op Vital signs reviewed and stable ? ?Post vital signs: Reviewed and stable ? ?Last Vitals:  ?Vitals Value Taken Time  ?BP 90/51 06/23/21 0816  ?Temp 36.7 ?C 06/23/21 0816  ?Pulse 67 06/23/21 0816  ?Resp 14 06/23/21 0816  ?SpO2 99 % 06/23/21 0816  ? ? ?Last Pain:  ?Vitals:  ? 06/23/21 0816  ?TempSrc: Oral  ?PainSc: 0-No pain  ?   ? ?  ? ?Complications: No notable events documented. ?

## 2021-06-24 LAB — ANTI-SMOOTH MUSCLE ANTIBODY, IGG: F-Actin IgG: 6 Units (ref 0–19)

## 2021-06-24 LAB — SURGICAL PATHOLOGY

## 2021-06-24 NOTE — Progress Notes (Signed)
These were collected in her pre op evaluation but I ordered them in the past for elevated LFTs ?

## 2021-06-25 LAB — ANTINUCLEAR ANTIBODIES, IFA: ANA Ab, IFA: NEGATIVE

## 2021-06-26 ENCOUNTER — Encounter (HOSPITAL_COMMUNITY): Payer: Self-pay | Admitting: Gastroenterology

## 2021-06-26 NOTE — Progress Notes (Signed)
This was ordered for elevated LFTs and it was collected during her pre op evaluation ?

## 2021-06-29 ENCOUNTER — Ambulatory Visit (INDEPENDENT_AMBULATORY_CARE_PROVIDER_SITE_OTHER): Payer: Medicare Other | Admitting: Gastroenterology

## 2021-06-30 ENCOUNTER — Ambulatory Visit (INDEPENDENT_AMBULATORY_CARE_PROVIDER_SITE_OTHER): Payer: Medicare Other | Admitting: Gastroenterology

## 2021-06-30 ENCOUNTER — Encounter (INDEPENDENT_AMBULATORY_CARE_PROVIDER_SITE_OTHER): Payer: Self-pay

## 2021-07-02 ENCOUNTER — Ambulatory Visit (INDEPENDENT_AMBULATORY_CARE_PROVIDER_SITE_OTHER): Payer: Medicare Other | Admitting: Gastroenterology

## 2021-07-02 ENCOUNTER — Encounter (INDEPENDENT_AMBULATORY_CARE_PROVIDER_SITE_OTHER): Payer: Self-pay | Admitting: Gastroenterology

## 2021-07-02 ENCOUNTER — Other Ambulatory Visit: Payer: Self-pay

## 2021-07-02 VITALS — BP 159/79 | HR 72 | Temp 98.1°F | Ht 61.0 in | Wt 117.9 lb

## 2021-07-02 DIAGNOSIS — K50019 Crohn's disease of small intestine with unspecified complications: Secondary | ICD-10-CM | POA: Diagnosis not present

## 2021-07-02 NOTE — Patient Instructions (Addendum)
Proceed with scheduled CT ?Continue prednisone taper ?Continue 6-MP 50 mg qday for now ?Please read about Larinda Buttery and Humira options ?

## 2021-07-02 NOTE — Progress Notes (Signed)
Maylon Peppers, M.D. ?Gastroenterology & Hepatology ?[facility] ?[address] ?[place], [place] [address] ? ?Primary Care Physician: ?Carylon Perches, NP ?[phone] ?[place] [address] ? ?I will communicate my assessment and recommendations to the referring MD via EMR. ? ?Problems: ?Ileal Crohn's disease status post small bowel resection ?Recurrent ileal stenosis ?Mildly elevated LFTs due to 6-MP ? ?History of Present Illness: ?[name] is a 61 y.o. female with past medical history of ileal Crohn's disease status post small bowel resection, COPD, rheumatoid arthritis, GERD, who presents for follow up of Crohn's disease. ? ?The patient was last seen on 06/01/2021. At that time, the patient was scheduled for a colonoscopy and a CT enterography.  Underwent blood work-up that showed low vitamin D levels (was given a prescription for vitamin D supplementation) and had mildly elevated aminotransferases.  Underwent further work-up for reversible causes of elevated LFTs which were considered to be secondary to use of 6-MP. ? ?Colonoscopy was performed on 06/23/2021.  She was found to have neo-terminal ileum contained a moderate stenosis measuring 5 mm (inner diameter) that could not be traversed. This was located 3 cm from the anastomosis. Biopsies were taken with a cold forceps for histology. Diffuse inflammation, moderate in severity and characterized by congestion (edema) and friability was ?found in the neo-terminal Ileum. Biopsies were taken with a cold forceps for histology.  Biopsies were all normal.  Rest of the colon was completely normal.  She was given prednisone taper. ? ?Patient reported she is still presenting significant symptoms. Patient repots that she cannot tolerate eating too many solid foods as she will have severe cramping and bloating.  Denies any diarrhea but states her bowel movements are loose and she feels frequent abdominal  discomfort and pain in her abdomen. ? ?The patient denies having any nausea, vomiting, fever, chills, hematochezia, melena, hematemesis, jaundice, pruritus or  ? ?Has never been on Remicade, althoguh she was offered this medication in the past.  Has never trialed Biologics in the past. ? ?Patient scheduled for CTE on 07/13/2021. ? ?Last Colonoscopy: As above ? ?Past Medical History: ?Past Medical History:  ?Diagnosis Date  ? Anorexia 10/01/2010  ? Arthritis   ? Chronic pain syndrome   ? bones and stomach  ? COPD (chronic obstructive pulmonary disease) ([place])   ? Crohn's 10/01/2010  ? Diarrhea 10/01/2010  ? Emesis 10/01/2010  ? GERD (gastroesophageal reflux disease)   ? Lower abdominal pain 10/01/2010  ? Pancreatitis   ? Rectal bleeding 10/01/2010  ? ? ?Past Surgical History: ?Past Surgical History:  ?Procedure Laterality Date  ?  Partial Hysterectomy  about 20 years ago  ? BIOPSY  06/23/2021  ? Procedure: BIOPSY;  Surgeon: Harvel Quale, MD;  Location: [facility];  Service: Gastroenterology;;  ? CHOLECYSTECTOMY    ? COLECTOMY  1995  ? x2  ? COLONOSCOPY  01/17/2006  ? October 2007 revealinga ileal ulcers proximal to anastomosis without stricture and a single rectal ulcer.  ? COLONOSCOPY WITH PROPOFOL N/A 04/02/2016  ? Procedure: COLONOSCOPY WITH PROPOFOL;  Surgeon: Rogene Houston, MD;  Location: [facility];  Service: Endoscopy;  Laterality: N/A;  1:00  ? COLONOSCOPY WITH PROPOFOL N/A 06/23/2021  ? Procedure: COLONOSCOPY WITH PROPOFOL;  Surgeon: Harvel Quale, MD;  Location: [facility];  Service: Gastroenterology;  Laterality: N/A;  805 ASA 1  ? ESOPHAGOGASTRODUODENOSCOPY (EGD) WITH PROPOFOL N/A 11/21/2013  ? Procedure: ESOPHAGOGASTRODUODENOSCOPY (EGD) WITH PROPOFOL;  Surgeon: Bernadene Person  Gloriann Loan, MD;  Location: [facility];  Service: Endoscopy;  Laterality: N/A;  ? HERNIA REPAIR    ? TOOTH EXTRACTION Left 10/17/2013  ? left lower molar  ? ? ?Family History: ?Family History  ?Problem Relation Age of  Onset  ? Dementia Mother   ? Heart attack Mother   ?     x 2  ? Heart disease Father   ? Heart attack Father   ? Diabetes Sister   ? Healthy Daughter   ? Healthy Son   ? Dementia Maternal Grandmother   ? Cancer Maternal Grandmother   ?     breast  ? ? ?Social History: ?Social History  ? ?Tobacco Use  ?Smoking Status Former  ? Packs/day: 0.25  ? Years: 35.00  ? Pack years: 8.75  ? Types: Cigarettes  ? Quit date: 11/29/2015  ? Years since quitting: 5.5  ?Smokeless Tobacco Never  ? ?Social History  ? ?Substance and Sexual Activity  ?Alcohol Use No  ? ?Social History  ? ?Substance and Sexual Activity  ?Drug Use No  ? ? ?Allergies: ?No Known Allergies ? ?Medications: ?Current Outpatient Medications  ?Medication Sig Dispense Refill  ? ALPRAZolam (XANAX) 0.5 MG tablet Take 0.5 mg by mouth at bedtime as needed for sleep.    ? Calcium Carbonate-Vitamin D (CALCIUM 500 + D PO) Take 1 tablet by mouth daily.    ? Cholecalciferol 25 MCG (1000 UT) CHEW Chew 1 tablet (1,000 Units total) by mouth daily. 90 tablet 3  ? diclofenac Sodium (VOLTAREN) 1 % GEL Apply 1 application topically 2 (two) times daily as needed (pain).    ? mercaptopurine (PURINETHOL) 50 MG tablet Give on an empty stomach 1 hour before or 2 hours after meals. Caution: Chemotherapy. (Patient taking differently: Take 50 mg by mouth daily. Give on an empty stomach 1 hour before or 2 hours after meals. Caution: Chemotherapy.) 60 tablet 0  ? Multiple Vitamin (MULTIVITAMIN WITH MINERALS) TABS tablet Take 1 tablet by mouth daily.    ? Multiple Vitamins-Minerals (HAIR/SKIN/NAILS/BIOTIN PO) Take 1 tablet by mouth daily.    ? oxyCODONE (OXYCONTIN) 40 MG 12 hr tablet Take 40-80 mg by mouth daily as needed for pain.    ? pantoprazole (PROTONIX) 40 MG tablet Take 1 tablet (40 mg total) by mouth daily. 90 tablet 3  ? predniSONE (DELTASONE) 5 MG tablet Take 8 tablets (40 mg total) by mouth daily with breakfast for 5 days, THEN 7 tablets (35 mg total) daily with breakfast for 5  days, THEN 6 tablets (30 mg total) daily with breakfast for 5 days, THEN 5 tablets (25 mg total) daily with breakfast for 5 days, THEN 4 tablets (20 mg total) daily with breakfast for 5 days, THEN 3 tablets (15 mg total) daily with breakfast for 5 days, THEN 2 tablets (10 mg total) daily with breakfast for 5 days, THEN 1 tablet (5 mg total) daily with breakfast for 5 days. 180 tablet 0  ? Turmeric (QC TUMERIC COMPLEX PO) Take by mouth daily at 6 (six) AM.    ? vitamin B-12 (CYANOCOBALAMIN) 1000 MCG tablet Take 1,000 mcg by mouth daily.    ? dicyclomine (BENTYL) 10 MG capsule Take 1 capsule (10 mg total) by mouth every 12 (twelve) hours as needed (abdominal pain). (Patient not taking: Reported on 06/12/2021) 60 capsule 2  ? ?No current facility-administered medications for this visit.  ? ? ?Review of Systems: ?GENERAL: negative for malaise, night sweats ?HEENT: No changes in hearing  or vision, no nose bleeds or other nasal problems. ?NECK: Negative for lumps, goiter, pain and significant neck swelling ?RESPIRATORY: Negative for cough, wheezing ?CARDIOVASCULAR: Negative for chest pain, leg swelling, palpitations, orthopnea ?GI: SEE HPI ?MUSCULOSKELETAL: Negative for joint pain or swelling, back pain, and muscle pain. ?SKIN: Negative for lesions, rash ?PSYCH: Negative for sleep disturbance, mood disorder and recent psychosocial stressors. ?HEMATOLOGY Negative for prolonged bleeding, bruising easily, and swollen nodes. ?ENDOCRINE: Negative for cold or heat intolerance, polyuria, polydipsia and goiter. ?NEURO: negative for tremor, gait imbalance, syncope and seizures. ?The remainder of the review of systems is noncontributory. ? ? ?Physical Exam: ?BP (!) 159/79 (BP Location: Left Arm, Patient Position: Sitting, Cuff Size: Small)   Pulse 72   Temp 98.1 ?F (36.7 ?C) (Oral)   Ht 5' 1"  (1.549 m)   Wt 117 lb 14.4 oz (53.5 kg)   BMI 22.28 kg/m?  ?GENERAL: The patient is AO x3, in no acute distress. ?HEENT: Head is  normocephalic and atraumatic. EOMI are intact. Mouth is well hydrated and without lesions. ?NECK: Supple. No masses ?LUNGS: Clear to auscultation. No presence of rhonchi/wheezing/rales. Adequate chest expansion ?HEART: RRR,

## 2021-07-13 ENCOUNTER — Ambulatory Visit (HOSPITAL_COMMUNITY): Payer: Medicare Other

## 2021-07-23 ENCOUNTER — Ambulatory Visit (INDEPENDENT_AMBULATORY_CARE_PROVIDER_SITE_OTHER): Payer: Medicare Other | Admitting: Gastroenterology

## 2021-08-06 ENCOUNTER — Ambulatory Visit (INDEPENDENT_AMBULATORY_CARE_PROVIDER_SITE_OTHER): Payer: Medicare Other | Admitting: Gastroenterology

## 2021-08-07 ENCOUNTER — Encounter (HOSPITAL_COMMUNITY): Payer: Self-pay

## 2021-08-07 ENCOUNTER — Ambulatory Visit (HOSPITAL_COMMUNITY): Admission: RE | Admit: 2021-08-07 | Payer: Medicare Other | Source: Ambulatory Visit

## 2021-09-03 ENCOUNTER — Other Ambulatory Visit (INDEPENDENT_AMBULATORY_CARE_PROVIDER_SITE_OTHER): Payer: Self-pay | Admitting: Gastroenterology

## 2021-09-03 DIAGNOSIS — K5000 Crohn's disease of small intestine without complications: Secondary | ICD-10-CM

## 2021-09-17 ENCOUNTER — Ambulatory Visit (HOSPITAL_COMMUNITY): Payer: Medicare Other

## 2021-10-05 ENCOUNTER — Ambulatory Visit (INDEPENDENT_AMBULATORY_CARE_PROVIDER_SITE_OTHER): Payer: Medicare Other | Admitting: Gastroenterology

## 2021-10-07 ENCOUNTER — Encounter (INDEPENDENT_AMBULATORY_CARE_PROVIDER_SITE_OTHER): Payer: Self-pay | Admitting: Gastroenterology

## 2021-10-29 ENCOUNTER — Ambulatory Visit (HOSPITAL_COMMUNITY)
Admission: RE | Admit: 2021-10-29 | Discharge: 2021-10-29 | Disposition: A | Discharge status: Home / Self Care | Payer: Medicare Other | Source: Ambulatory Visit | Attending: Gastroenterology | Admitting: Gastroenterology

## 2021-10-29 DIAGNOSIS — K50019 Crohn's disease of small intestine with unspecified complications: Secondary | ICD-10-CM | POA: Diagnosis present

## 2021-10-29 MED ORDER — IOHEXOL 350 MG/ML SOLN: 100.0000 mL | Freq: Once | INTRAVENOUS | Status: DC | PRN

## 2021-10-29 MED ORDER — IOHEXOL 300 MG/ML  SOLN
100.0000 mL | Freq: Once | INTRAMUSCULAR | Status: AC | PRN
  Administered 2021-10-29: 100 mL via INTRAVENOUS

## 2021-11-04 ENCOUNTER — Other Ambulatory Visit (INDEPENDENT_AMBULATORY_CARE_PROVIDER_SITE_OTHER): Payer: Self-pay

## 2021-11-04 DIAGNOSIS — K861 Other chronic pancreatitis: Secondary | ICD-10-CM

## 2021-11-04 DIAGNOSIS — K838 Other specified diseases of biliary tract: Secondary | ICD-10-CM

## 2021-11-04 DIAGNOSIS — R7989 Other specified abnormal findings of blood chemistry: Secondary | ICD-10-CM

## 2021-11-19 ENCOUNTER — Ambulatory Visit (HOSPITAL_COMMUNITY): Payer: Medicare Other

## 2021-11-23 ENCOUNTER — Ambulatory Visit (INDEPENDENT_AMBULATORY_CARE_PROVIDER_SITE_OTHER): Payer: Medicare Other | Admitting: Gastroenterology

## 2021-11-23 ENCOUNTER — Encounter (INDEPENDENT_AMBULATORY_CARE_PROVIDER_SITE_OTHER): Payer: Self-pay | Admitting: Gastroenterology

## 2021-11-30 ENCOUNTER — Ambulatory Visit (HOSPITAL_COMMUNITY)
Admission: RE | Admit: 2021-11-30 | Discharge: 2021-11-30 | Disposition: A | Discharge status: Home / Self Care | Payer: Medicare Other | Source: Ambulatory Visit | Attending: Gastroenterology | Admitting: Gastroenterology

## 2021-11-30 ENCOUNTER — Other Ambulatory Visit (INDEPENDENT_AMBULATORY_CARE_PROVIDER_SITE_OTHER): Payer: Self-pay | Admitting: Gastroenterology

## 2021-11-30 DIAGNOSIS — R7989 Other specified abnormal findings of blood chemistry: Secondary | ICD-10-CM | POA: Diagnosis present

## 2021-11-30 DIAGNOSIS — K838 Other specified diseases of biliary tract: Secondary | ICD-10-CM | POA: Diagnosis present

## 2021-11-30 DIAGNOSIS — K861 Other chronic pancreatitis: Secondary | ICD-10-CM | POA: Diagnosis present

## 2021-11-30 MED ORDER — GADOBUTROL 1 MMOL/ML IV SOLN
5.0000 mL | Freq: Once | INTRAVENOUS | Status: AC | PRN
  Administered 2021-11-30: 5 mL via INTRAVENOUS

## 2021-12-04 ENCOUNTER — Other Ambulatory Visit (INDEPENDENT_AMBULATORY_CARE_PROVIDER_SITE_OTHER): Payer: Self-pay

## 2021-12-04 DIAGNOSIS — K838 Other specified diseases of biliary tract: Secondary | ICD-10-CM

## 2021-12-04 DIAGNOSIS — R7989 Other specified abnormal findings of blood chemistry: Secondary | ICD-10-CM

## 2021-12-04 DIAGNOSIS — K861 Other chronic pancreatitis: Secondary | ICD-10-CM

## 2021-12-04 DIAGNOSIS — R748 Abnormal levels of other serum enzymes: Secondary | ICD-10-CM

## 2021-12-04 DIAGNOSIS — K50019 Crohn's disease of small intestine with unspecified complications: Secondary | ICD-10-CM

## 2021-12-04 DIAGNOSIS — R7401 Elevation of levels of liver transaminase levels: Secondary | ICD-10-CM

## 2021-12-24 ENCOUNTER — Telehealth (INDEPENDENT_AMBULATORY_CARE_PROVIDER_SITE_OTHER): Payer: Self-pay | Admitting: *Deleted

## 2021-12-24 ENCOUNTER — Other Ambulatory Visit: Payer: Self-pay | Admitting: Gastroenterology

## 2021-12-24 DIAGNOSIS — K50019 Crohn's disease of small intestine with unspecified complications: Secondary | ICD-10-CM

## 2021-12-24 MED ORDER — PREDNISONE 10 MG PO TABS: ORAL_TABLET | ORAL | 0 refills | Status: DC

## 2021-12-24 NOTE — Telephone Encounter (Signed)
Patient left voicemail that is was very important for Dr. Jenetta Downer to call her as soon as possible.  I called patient back to see what concerns she had and she reports she stopped mercaptopurine after her CT scan in July and has not been on anything for Crohn's since. She was taking 72m one before breaksfast. States med worked well for her and she does not want to do injections or infusions. States she does not care about the side effects because the mercaptopurine worked well for her and since stopping she cant leave the house without having accidents. Has to wear a pad all the time.   [phone]

## 2021-12-24 NOTE — Telephone Encounter (Signed)
Spoke to the patient today, she reports she is very symptomatic as she is having diarrhea and rectal bleeding.  Unfortunately, she cannot be restarted on 6-MP as she developed elevated liver enzymes due to this and now has cirrhosis possibly related to this medication. We discussed about different options and she would like to start on Humira -please start the authorization process for this.  I will send her a short prednisone taper for now.

## 2021-12-25 ENCOUNTER — Other Ambulatory Visit (INDEPENDENT_AMBULATORY_CARE_PROVIDER_SITE_OTHER): Payer: Self-pay | Admitting: *Deleted

## 2021-12-25 MED ORDER — HUMIRA (2 PEN) 80 MG/0.8ML ~~LOC~~ PNKT: PEN_INJECTOR | SUBCUTANEOUS | 0 refills | Status: DC

## 2021-12-25 MED ORDER — HUMIRA (2 PEN) 40 MG/0.4ML ~~LOC~~ AJKT: AUTO-INJECTOR | SUBCUTANEOUS | 11 refills | Status: DC

## 2021-12-25 NOTE — Telephone Encounter (Signed)
Rx, [employer] forms and notes submitted to [employer]. Await decision.

## 2021-12-29 NOTE — Telephone Encounter (Signed)
Fax from [employer] Humira is approved through 06/25/22. Estimated co pay $0. [employer] can be called at [phone]. I called patient to let her know and she states she spoke with [employer] yesterday and was told it would ship on sept 19th and she should receive it on 9/20. I asked her to call office when she receives med so we can set up nurse visit for instruction to give injection.

## 2022-01-12 NOTE — Telephone Encounter (Signed)
Called patient to see if she got humira and she reports she did and made nurse visit for 10/3.  Patient is aware of appt and will bring humira with her to visit to get first injection.

## 2022-01-19 ENCOUNTER — Ambulatory Visit (INDEPENDENT_AMBULATORY_CARE_PROVIDER_SITE_OTHER): Payer: Self-pay | Admitting: Gastroenterology

## 2022-01-19 ENCOUNTER — Encounter (INDEPENDENT_AMBULATORY_CARE_PROVIDER_SITE_OTHER): Payer: Self-pay

## 2022-01-19 VITALS — BP 120/73 | HR 74 | Temp 97.9°F | Ht 62.0 in | Wt 113.7 lb

## 2022-01-19 DIAGNOSIS — K50019 Crohn's disease of small intestine with unspecified complications: Secondary | ICD-10-CM

## 2022-01-19 NOTE — Progress Notes (Signed)
Patient seen today for first dose of SQ Humira 62m which was discussed and administered by CGustavus Bryant CMA. Patient tolerated injection well. I evaluated the patient 15 minutes after her injection, No signs of acute reaction. She denied sob, rash or swelling. She is aware of signs/symptoms to be aware of in regards to reaction to medication.   Ameliah Baskins L. CAlver Sorrow MSN, APRN, AGNP-C Adult-Gerontology Nurse Practitioner [facility]

## 2022-02-04 ENCOUNTER — Ambulatory Visit (INDEPENDENT_AMBULATORY_CARE_PROVIDER_SITE_OTHER): Payer: Medicare Other | Admitting: Gastroenterology

## 2022-02-08 ENCOUNTER — Ambulatory Visit (INDEPENDENT_AMBULATORY_CARE_PROVIDER_SITE_OTHER): Payer: Medicare Other | Admitting: Gastroenterology

## 2022-03-01 ENCOUNTER — Other Ambulatory Visit (INDEPENDENT_AMBULATORY_CARE_PROVIDER_SITE_OTHER): Payer: Self-pay

## 2022-03-01 DIAGNOSIS — K50019 Crohn's disease of small intestine with unspecified complications: Secondary | ICD-10-CM

## 2022-03-01 MED ORDER — HUMIRA (2 PEN) 40 MG/0.4ML ~~LOC~~ AJKT: AUTO-INJECTOR | SUBCUTANEOUS | 11 refills | Status: DC

## 2022-04-06 ENCOUNTER — Encounter (INDEPENDENT_AMBULATORY_CARE_PROVIDER_SITE_OTHER): Payer: Self-pay | Admitting: *Deleted

## 2022-04-06 ENCOUNTER — Ambulatory Visit (INDEPENDENT_AMBULATORY_CARE_PROVIDER_SITE_OTHER): Payer: Medicare Other | Admitting: Gastroenterology

## 2022-04-06 ENCOUNTER — Encounter (INDEPENDENT_AMBULATORY_CARE_PROVIDER_SITE_OTHER): Payer: Self-pay | Admitting: Gastroenterology

## 2022-04-06 VITALS — BP 138/83 | HR 76 | Temp 97.8°F | Ht 62.0 in | Wt 114.6 lb

## 2022-04-06 DIAGNOSIS — E559 Vitamin D deficiency, unspecified: Secondary | ICD-10-CM

## 2022-04-06 DIAGNOSIS — K50019 Crohn's disease of small intestine with unspecified complications: Secondary | ICD-10-CM | POA: Diagnosis not present

## 2022-04-06 DIAGNOSIS — Z7952 Long term (current) use of systemic steroids: Secondary | ICD-10-CM | POA: Diagnosis not present

## 2022-04-06 DIAGNOSIS — K580 Irritable bowel syndrome with diarrhea: Secondary | ICD-10-CM

## 2022-04-06 DIAGNOSIS — K7469 Other cirrhosis of liver: Secondary | ICD-10-CM | POA: Diagnosis not present

## 2022-04-06 NOTE — Progress Notes (Signed)
Referring Provider: Carylon Perches, NP Primary Care Physician:  Carylon Perches, NP Primary GI Physician: Jenetta Downer   Chief Complaint  Patient presents with   Follow-up    Patient here today for a follow up on Crohn's. She says she does not believe the Humira is of much help as she is having fecal incontinence.    HPI:   [name] is a 61 y.o. female with past medical history of ileal Crohn's disease status post small bowel resection, COPD, rheumatoid arthritis, GERD   Patient presenting today for follow up of ileal Crohn's disease.  Last seen in march 2023, at that time still having significant symptoms, cannot tolerate much solid food, having severe cramping, bloating, BMs loose and having frequent abdominal pain. No previous biologic therapy. Pt had upcoming CT entero abd/pelvis with contrast.  Recommend proceeding with scheduled CT, continue prednisone taper, continue 6-MP 39m daily, consider biologic therapy, smoking cessation.   CT Entero Abd/Pelvis w contrast: 10/29/21 Severe intra and extrahepatic biliary ductal dilatation. This appears similar to prior noncontrast CT the abdomen and pelvis 05/13/2020, favored to reflect benign post cholecystectomy physiology. However, in the setting of elevated liver function tests, if there is clinical concern for biliary tract obstruction, further evaluation with abdominal MRI with and without IV gadolinium with MRCP would be recommended to exclude the possibility of noncalcified choledocholithiasis or subtle obstructing ampullary lesion.Morphologic changes in the liver indicative of underlying cirrhosis. Morphologic changes in the pancreas of chronic pancreatitis redemonstrated. Borderline splenomegaly.  MRCP thereafter 11/30/21 Notable intrahepatic and extrahepatic biliary dilatation extending to a somewhat irregular ampullary region. This could be from scarring or an underlying stone. The gallbladder is surgically absent. No obvious  differentially enhancing mass is identified in the ampulla. 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 Aortic Atherosclerosis (ICD10-I70.0). Trace ascites in the left upper quadrant  Further serologies in light of cirrhosis were done which were negative for AIH, viral hep or wilsons disease, suspect 6-MP and fatty liver contributed to cirrhosis. Again recommended biologic therapy and d/c of 6-MP.  She was advised to have repeat CMP in August but this was not completed.  Humira was started on 01/19/22. Currently doing Humira 463mevery other week.  Present:  She states that she has to wear a pad/panty liner daily due her fistula and fecal incontinence. She reports a lot of abdominal cramping. On a bad day she may have up to 8-10 episodes of looser stools, though on average having 2-3 loose stools since starting Humira. Took imodium in the past but was no longer providing much results so currently not taking anything for her loose stools. She notes some blood in stools on occasion. No mucus in stools. Felt on 6-MP she was having a solid BM daily and rare abdominal cramping so is concerned that Humira is not working well for her. Appetite is not good though she has gained some weight back that she had previously lost. She usually forces herself to eat. Next Humira dose is tomorrow.   She notes that abdomen has become larger and feels firm since she started on Humira. She denies itching, jaundice or confusion. No SOB or swelling to LEs  Extraintestinal Manifestations: Skin:no rashes, no skin lesions  Joints: has some chronic joint pain, worse when the weather changes Eyes: no vision changes, sees eye doctor regularly   Having pap smear next month No flu vaccine this year No covid vaccines No pneumonia vaccines in the past  Last bone density July 2020  Last Colonoscopy:06/23/2021.  She was found to have neo-terminal ileum contained a moderate stenosis measuring 5 mm (inner diameter) that could  not be traversed. This was located 3 cm from the anastomosis. Biopsies were taken with a cold forceps for histology. Diffuse inflammation, moderate in severity and characterized by congestion (edema) and friability was found in the neo-terminal Ileum. Biopsies were taken with a cold forceps for histology.  Biopsies were all normal.  Rest of the colon was completely normal.  She was given prednisone taper. Last Endoscopy:  Recommendations:    Past Medical History:  Diagnosis Date   Anorexia 10/01/2010   Arthritis    Chronic pain syndrome    bones and stomach   COPD (chronic obstructive pulmonary disease) ([place])    Crohn's 10/01/2010   Diarrhea 10/01/2010   Emesis 10/01/2010   GERD (gastroesophageal reflux disease)    Lower abdominal pain 10/01/2010   Pancreatitis    Rectal bleeding 10/01/2010    Past Surgical History:  Procedure Laterality Date    Partial Hysterectomy  about 20 years ago   BIOPSY  06/23/2021   Procedure: BIOPSY;  Surgeon: Harvel Quale, MD;  Location: [facility];  Service: Gastroenterology;;   [place]   x2   COLONOSCOPY  01/17/2006   October 2007 revealinga ileal ulcers proximal to anastomosis without stricture and a single rectal ulcer.   COLONOSCOPY WITH PROPOFOL N/A 04/02/2016   Procedure: COLONOSCOPY WITH PROPOFOL;  Surgeon: Rogene Houston, MD;  Location: [facility];  Service: Endoscopy;  Laterality: N/A;  1:00   COLONOSCOPY WITH PROPOFOL N/A 06/23/2021   Procedure: COLONOSCOPY WITH PROPOFOL;  Surgeon: Harvel Quale, MD;  Location: [facility];  Service: Gastroenterology;  Laterality: N/A;  805 ASA 1   ESOPHAGOGASTRODUODENOSCOPY (EGD) WITH PROPOFOL N/A 11/21/2013   Procedure: ESOPHAGOGASTRODUODENOSCOPY (EGD) WITH PROPOFOL;  Surgeon: Rogene Houston, MD;  Location: [facility];  Service: Endoscopy;  Laterality: N/A;   HERNIA REPAIR     TOOTH EXTRACTION Left 10/17/2013   left lower molar    Current  Outpatient Medications  Medication Sig Dispense Refill   Adalimumab (HUMIRA PEN) 40 MG/0.4ML PNKT Inject subQ 47m every other week 2 each 11   ALPRAZolam (XANAX) 0.5 MG tablet Take 0.5 mg by mouth at bedtime as needed for sleep.     Calcium Carbonate-Vitamin D (CALCIUM 500 + D PO) Take 1 tablet by mouth daily.     Cholecalciferol 25 MCG (1000 UT) CHEW Chew 1 tablet (1,000 Units total) by mouth daily. 90 tablet 3   diclofenac Sodium (VOLTAREN) 1 % GEL Apply 1 application topically 2 (two) times daily as needed (pain).     dicyclomine (BENTYL) 10 MG capsule Take 1 capsule (10 mg total) by mouth every 12 (twelve) hours as needed (abdominal pain). 60 capsule 2   famotidine (PEPCID) 20 MG tablet Take 20 mg by mouth daily.     Multiple Vitamin (MULTIVITAMIN WITH MINERALS) TABS tablet Take 1 tablet by mouth daily.     oxyCODONE (OXYCONTIN) 40 MG 12 hr tablet Take 30 mg by mouth daily as needed for pain. 2-3 per day per patient prn.     Turmeric (QC TUMERIC COMPLEX PO) Take by mouth daily at 6 (six) AM.     vitamin B-12 (CYANOCOBALAMIN) 1000 MCG tablet Take 1,000 mcg by mouth daily.     zinc gluconate 50 MG tablet Take 50 mg by mouth daily.     No current  facility-administered medications for this visit.    Allergies as of 04/06/2022   (No Known Allergies)    Family History  Problem Relation Age of Onset   Dementia Mother    Heart attack Mother        x 2   Heart disease Father    Heart attack Father    Diabetes Sister    Healthy Daughter    Healthy Son    Dementia Maternal Grandmother    Cancer Maternal Grandmother        breast    Social History   Socioeconomic History   Marital status: Divorced    Spouse name: Not on file   Number of children: 2   Years of education: Not on file   Highest education level: Not on file  Occupational History   Occupation: disabled  Tobacco Use   Smoking status: Former    Packs/day: 0.25    Years: 35.00    Total pack years: 8.75    Types:  Cigarettes    Quit date: 11/29/2015    Years since quitting: 6.3   Smokeless tobacco: Never  Vaping Use   Vaping Use: Never used  Substance and Sexual Activity   Alcohol use: No   Drug use: No   Sexual activity: Not Currently    Birth control/protection: Surgical    Comment: hyst  Other Topics Concern   Not on file  Social History Narrative   Not on file   Social Determinants of Health   Financial Resource Strain: Not on file  Food Insecurity: Not on file  Transportation Needs: Not on file  Physical Activity: Not on file  Stress: Not on file  Social Connections: Not on file   Review of systems General: negative for malaise, night sweats, fever, chills, weight loss Neck: Negative for lumps, goiter, pain and significant neck swelling Resp: Negative for cough, wheezing, dyspnea at rest CV: Negative for chest pain, leg swelling, palpitations, orthopnea GI: denies melena, nausea, vomiting, diarrhea, constipation, dysphagia, odyonophagia, early satiety or unintentional weight loss. +loose stools +occasional rectal bleeding +abdominal cramping MSK: Negative for joint pain or swelling, back pain, and muscle pain. Derm: Negative for itching or rash Psych: Denies depression, anxiety, memory loss, confusion. No homicidal or suicidal ideation.  Heme: Negative for prolonged bleeding, bruising easily, and swollen nodes. Endocrine: Negative for cold or heat intolerance, polyuria, polydipsia and goiter. Neuro: negative for tremor, gait imbalance, syncope and seizures. The remainder of the review of systems is noncontributory.  Physical Exam: BP 138/83 (BP Location: Left Arm, Patient Position: Sitting, Cuff Size: Large)   Pulse 76   Temp 97.8 F (36.6 C) (Temporal)   Ht 5' 2"  (1.575 m)   Wt 114 lb 9.6 oz (52 kg)   BMI 20.96 kg/m  General:   Alert and oriented. No distress noted. Pleasant and cooperative.  Head:  Normocephalic and atraumatic. Eyes:  Conjuctiva clear without scleral  icterus. Mouth:  Oral mucosa pink and moist. Good dentition. No lesions. Heart: Normal rate and rhythm, s1 and s2 heart sounds present.  Lungs: Clear lung sounds in all lobes. Respirations equal and unlabored. Abdomen:  +BS, soft, and non-distended. TTP of LLQ and RLQ. No obvious ascites. No rebound or guarding. No HSM or masses noted. Derm: No palmar erythema or jaundice Msk:  Symmetrical without gross deformities. Normal posture. Extremities:  Without edema. Neurologic:  Alert and  oriented x4 Psych:  Alert and cooperative. Normal mood and affect.  Invalid input(s): "6 MONTHS"  ASSESSMENT: EMERSEN MASCARI is a 61 y.o. female presenting today for follow up of Crohn's disease and newly diagnosed cirrhosis.  Ileal Crohn's disease: on humira since October, having anywhere from 2-10 loose stools a day with some rectal bleeding and abdominal cramping. Appetite is poor. She generally does not feel well since starting Humira. As her next dose is tomorrow, recommend checking drug levels as we may need to increase Humira dosing if levels are suboptimal. Will check CRP and fecal calprotectin too to evaluate for active inflammation. As she had long history of steroid use in the past, and history of vitamin d deficiency, will check vit d levels and update DEXA, last was in 2020.   Hepatic cirrhosis: diagnosed this summer, workup suggested that likely secondary to UTI fatty liver and/ or combination of use of 6-MP for many years as she does not drink alcohol.  Discussion had with patient regarding diagnosis of cirrhosis and importance of routine labs and imaging every 6 months with increased risk of incidence of HCC, bleeding, especially with increased risk of esophageal varices, retention of ammonia/fluid, importance of avoiding alcohol and NSAIDs. Pt verbalized understanding of this.  Last MELD from labs in feb 2023 was 7. She appears well compensated thus far. She is up-to-date on imaging as she had MRCP in  August.  Will update MELD labs and AFP today. Will scheduled EGD for variceal screening if plt count is <150k once labs result.    PLAN:  -MELD labs, INR, AFP  -CRP, fecal calprotectin, vitamin d - Liver imaging via [place] in feb 2024 -Humira drug/ab levels just prior to next dose (need to be done today) - Dexa scan - Reduce salt intake to <2 g per day - Can take Tylenol max of 2 g per day (650 mg q8h) for pain - Avoid NSAIDs for pain - Avoid eating raw oysters/shellfish - Ensure every night before going to sleep -consider repeat colonoscopy in April 2024 - EGD for esophageal varice screening if plt count <150k -TB and Hep B in February 2024  All questions were answered, patient verbalized understanding and is in agreement with plan as outlined above.   Follow Up: 2 months   Solenne Manwarren L. Alver Sorrow, MSN, APRN, AGNP-C Adult-Gerontology Nurse Practitioner [facility]  I have reviewed the note and agree with the APP's assessment as described in this progress note  Maylon Peppers, MD Gastroenterology and Hepatology [facility]

## 2022-04-06 NOTE — Patient Instructions (Signed)
-  We will update cirrhosis labs, check humira levels (this needs to be done today, prior to your dose tomorrow), vitamin D levels and inflammatory markers. Further recommendations regarding humira dosing to follow once labs are back - Liver imaging with [place] in feb 2024 -will schedule bone density scan - Reduce salt intake to <2 g per day - Can take Tylenol max of 2 g per day (650 mg q8h) for pain - Avoid NSAIDs for pain - Avoid eating raw oysters/shellfish - Ensure every night before going to sleep  Follow up 2 months

## 2022-04-13 LAB — CBC
HCT: 35 % (ref 35.0–45.0)
Hemoglobin: 12 g/dL (ref 11.7–15.5)
MCH: 31.7 pg (ref 27.0–33.0)
MCHC: 34.3 g/dL (ref 32.0–36.0)
MCV: 92.6 fL (ref 80.0–100.0)
MPV: 11.9 fL (ref 7.5–12.5)
Platelets: 106 10*3/uL — ABNORMAL LOW (ref 140–400)
RBC: 3.78 10*6/uL — ABNORMAL LOW (ref 3.80–5.10)
RDW: 14.5 % (ref 11.0–15.0)
WBC: 5.5 10*3/uL (ref 3.8–10.8)

## 2022-04-13 LAB — COMPREHENSIVE METABOLIC PANEL
AG Ratio: 1.2 (calc) (ref 1.0–2.5)
ALT: 24 U/L (ref 6–29)
AST: 49 U/L — ABNORMAL HIGH (ref 10–35)
Albumin: 3.7 g/dL (ref 3.6–5.1)
Alkaline phosphatase (APISO): 193 U/L — ABNORMAL HIGH (ref 37–153)
BUN/Creatinine Ratio: 8 (calc) (ref 6–22)
BUN: 5 mg/dL — ABNORMAL LOW (ref 7–25)
CO2: 29 mmol/L (ref 20–32)
Calcium: 8.9 mg/dL (ref 8.6–10.4)
Chloride: 106 mmol/L (ref 98–110)
Creat: 0.63 mg/dL (ref 0.50–1.05)
Globulin: 3.1 g/dL (calc) (ref 1.9–3.7)
Glucose, Bld: 103 mg/dL — ABNORMAL HIGH (ref 65–99)
Potassium: 3.5 mmol/L (ref 3.5–5.3)
Sodium: 144 mmol/L (ref 135–146)
Total Bilirubin: 0.4 mg/dL (ref 0.2–1.2)
Total Protein: 6.8 g/dL (ref 6.1–8.1)

## 2022-04-13 LAB — AFP TUMOR MARKER: AFP-Tumor Marker: 4.6 ng/mL

## 2022-04-13 LAB — PROTIME-INR
INR: 1.6 — ABNORMAL HIGH
Prothrombin Time: 16.1 s — ABNORMAL HIGH (ref 9.0–11.5)

## 2022-04-13 LAB — ADALIMUMAB DRUG LEVEL AND ANTI-DRUG AB FOR RHEUMATIC DISEASE
ADALIMUMAB AB,RHEUM: <10 AU (ref <10)
ADALIMUMAB LEVEL, RHEUM: 12.4 ug/mL

## 2022-04-13 LAB — VITAMIN D 25 HYDROXY (VIT D DEFICIENCY, FRACTURES): Vit D, 25-Hydroxy: 12 ng/mL — ABNORMAL LOW (ref 30–100)

## 2022-04-13 LAB — C-REACTIVE PROTEIN: CRP: 5.2 mg/L (ref <8.0)

## 2022-04-14 ENCOUNTER — Other Ambulatory Visit (INDEPENDENT_AMBULATORY_CARE_PROVIDER_SITE_OTHER): Payer: Self-pay | Admitting: Gastroenterology

## 2022-04-16 LAB — CALPROTECTIN: Calprotectin: 83 mcg/g

## 2022-04-21 ENCOUNTER — Other Ambulatory Visit (HOSPITAL_COMMUNITY): Payer: Medicare Other

## 2022-04-26 ENCOUNTER — Ambulatory Visit: Payer: Medicare Other | Admitting: Adult Health

## 2022-04-28 ENCOUNTER — Telehealth (INDEPENDENT_AMBULATORY_CARE_PROVIDER_SITE_OTHER): Payer: Self-pay | Admitting: *Deleted

## 2022-04-28 NOTE — Telephone Encounter (Signed)
Rec'd call from [facility] Monday advising [place] they have not been able to get in touch with patient to scheduled ERCP. I called [name] after that call and gave her the number and she assured me she would call them. Rec'd another call today from [facility] advising [place] patient still had not called them

## 2022-04-29 ENCOUNTER — Other Ambulatory Visit (INDEPENDENT_AMBULATORY_CARE_PROVIDER_SITE_OTHER): Payer: Self-pay | Admitting: Gastroenterology

## 2022-04-29 ENCOUNTER — Encounter (HOSPITAL_COMMUNITY): Payer: Self-pay

## 2022-04-29 ENCOUNTER — Other Ambulatory Visit (HOSPITAL_COMMUNITY): Payer: Medicare Other

## 2022-04-29 DIAGNOSIS — K5000 Crohn's disease of small intestine without complications: Secondary | ICD-10-CM

## 2022-06-02 ENCOUNTER — Ambulatory Visit (INDEPENDENT_AMBULATORY_CARE_PROVIDER_SITE_OTHER): Payer: 59 | Admitting: Adult Health

## 2022-06-02 ENCOUNTER — Encounter: Payer: Self-pay | Admitting: Adult Health

## 2022-06-02 VITALS — BP 132/88 | HR 78 | Ht 62.0 in | Wt 112.0 lb

## 2022-06-02 DIAGNOSIS — Z90710 Acquired absence of both cervix and uterus: Secondary | ICD-10-CM | POA: Insufficient documentation

## 2022-06-02 DIAGNOSIS — R159 Full incontinence of feces: Secondary | ICD-10-CM | POA: Insufficient documentation

## 2022-06-02 DIAGNOSIS — R195 Other fecal abnormalities: Secondary | ICD-10-CM | POA: Diagnosis not present

## 2022-06-02 DIAGNOSIS — Z8719 Personal history of other diseases of the digestive system: Secondary | ICD-10-CM | POA: Insufficient documentation

## 2022-06-02 DIAGNOSIS — K50019 Crohn's disease of small intestine with unspecified complications: Secondary | ICD-10-CM | POA: Diagnosis not present

## 2022-06-02 DIAGNOSIS — Z1321 Encounter for screening for nutritional disorder: Secondary | ICD-10-CM | POA: Insufficient documentation

## 2022-06-02 DIAGNOSIS — Z1211 Encounter for screening for malignant neoplasm of colon: Secondary | ICD-10-CM

## 2022-06-02 DIAGNOSIS — N823 Fistula of vagina to large intestine: Secondary | ICD-10-CM

## 2022-06-02 DIAGNOSIS — Z0000 Encounter for general adult medical examination without abnormal findings: Secondary | ICD-10-CM

## 2022-06-02 DIAGNOSIS — Z01419 Encounter for gynecological examination (general) (routine) without abnormal findings: Secondary | ICD-10-CM

## 2022-06-02 LAB — HEMOCCULT GUIAC POC 1CARD (OFFICE): Fecal Occult Blood, POC: POSITIVE — AB

## 2022-06-02 NOTE — Progress Notes (Signed)
Patient ID: [name], female   DOB: 10-10-1960, 62 y.o.   MRN: [id number] History of Present Illness: [name] is a 62 year old white female, divorced, sp hysterectomy, in for well woman gyn exam. She has Crohn's Disease and has had fecal incontinence, and rectal bleeding and when stool liquid comes from vagina too, has history of RV fistula with repeat in past. She says stomach bubbles after eating. She is on humira now and does not think it is helping, used to take mercaptopurine and liked it better, but was told has cirrhosis of the liver.   PCP is Dr Ruthann Cancer at [facility]   Current Medications, Allergies, Past Medical History, Past Surgical History, Family History and Social History were reviewed in [facility] record.     Review of Systems: Patient denies any headaches, hearing loss, fatigue, blurred vision, shortness of breath, chest pain, abdominal pain, problems with urination, or intercourse(not active). No joint pain or mood swings.  See HPI for positives.    Physical Exam:BP 132/88 (BP Location: Right Arm, Patient Position: Sitting, Cuff Size: Normal)   Pulse 78   Ht 5' 2"$  (1.575 m)   Wt 112 lb (50.8 kg)   BMI 20.49 kg/m   General:  Well developed, well nourished, no acute distress Skin:  Warm and dry Neck:  Midline trachea, normal thyroid, good ROM, no lymphadenopathy,no carotid bruits heard Lungs; Clear to auscultation bilaterally Breast:  No dominant palpable mass, retraction, or nipple discharge Cardiovascular: Regular rate and rhythm Abdomen:  Soft, mildly tender, no hepatosplenomegaly,felt Pelvic:  External genitalia is normal in appearance, no lesions.  The vagina is pale, ?small RV fistula. Urethra has no lesions or masses. The cervix and uterus are absent.  No adnexal masses or tenderness noted.Bladder is non tender, no masses felt. Rectal: Good sphincter tone, no polyps, or hemorrhoids felt.  Hemoccult  positive. Extremities/musculoskeletal:  No swelling or varicosities noted, no clubbing or cyanosis Psych:  No mood changes, alert and cooperative,seems happy AA is 0 Fall risk is low    06/02/2022   10:47 AM 10/17/2018    1:43 PM 04/05/2016    2:47 PM  Depression screen PHQ 2/9  Decreased Interest 0 3 1  Down, Depressed, Hopeless 0 0 1  PHQ - 2 Score 0 3 2  Altered sleeping 2 0 2  Tired, decreased energy 0 1 1  Change in appetite 0 1 3  Feeling bad or failure about yourself  0 0 0  Trouble concentrating 0 0 0  Moving slowly or fidgety/restless 0 0 0  Suicidal thoughts 0 0 0  PHQ-9 Score 2 5 8  $ Difficult doing work/chores  Not difficult at all        06/02/2022   10:47 AM  GAD 7 : Generalized Anxiety Score  Nervous, Anxious, on Edge 1  Control/stop worrying 0  Worry too much - different things 0  Trouble relaxing 0  Restless 0  Easily annoyed or irritable 0  Afraid - awful might happen 1  Total GAD 7 Score 2    Upstream - 06/02/22 1042       Pregnancy Intention Screening   Does the patient want to become pregnant in the next year? N/A    Does the patient's partner want to become pregnant in the next year? N/A    Would the patient like to discuss contraceptive options today? N/A      Contraception Wrap Up   Current Method Female Sterilization  hyst   End Method Female Sterilization   hyst   Contraception Counseling Provided No            Examination chaperoned by Levy Pupa LPN    Impression and plan: 1. Encounter for well woman exam with routine gynecological exam Physical with PCP Labs with PCP Mammogram was negative 05/11/21  2. Encounter for screening fecal occult blood testing +hemoccult  - POCT occult blood stool  3. Fecal occult blood test positive +hemoccult - POCT occult blood stool  4. Crohn's disease of small intestine with complication ([place]) Has appt 2/19 24 with GI  5. RVF (rectovaginal fistula) Has stool from vaginal when  liquid  6. History of rectal bleeding Follow up with GI  7. Incontinence of feces, unspecified fecal incontinence type Follow up with GI   8. S/P hysterectomy No pap needed

## 2022-06-07 ENCOUNTER — Encounter (INDEPENDENT_AMBULATORY_CARE_PROVIDER_SITE_OTHER): Payer: Self-pay | Admitting: *Deleted

## 2022-06-07 ENCOUNTER — Ambulatory Visit (INDEPENDENT_AMBULATORY_CARE_PROVIDER_SITE_OTHER): Payer: 59 | Admitting: Gastroenterology

## 2022-06-07 ENCOUNTER — Encounter (INDEPENDENT_AMBULATORY_CARE_PROVIDER_SITE_OTHER): Payer: Self-pay | Admitting: Gastroenterology

## 2022-06-07 VITALS — BP 122/84 | HR 76 | Temp 97.8°F | Ht 62.0 in | Wt 110.9 lb

## 2022-06-07 DIAGNOSIS — K50019 Crohn's disease of small intestine with unspecified complications: Secondary | ICD-10-CM

## 2022-06-07 DIAGNOSIS — K7469 Other cirrhosis of liver: Secondary | ICD-10-CM

## 2022-06-07 DIAGNOSIS — Z9289 Personal history of other medical treatment: Secondary | ICD-10-CM

## 2022-06-07 DIAGNOSIS — K838 Other specified diseases of biliary tract: Secondary | ICD-10-CM

## 2022-06-07 DIAGNOSIS — E559 Vitamin D deficiency, unspecified: Secondary | ICD-10-CM | POA: Diagnosis not present

## 2022-06-07 DIAGNOSIS — Z1321 Encounter for screening for nutritional disorder: Secondary | ICD-10-CM

## 2022-06-07 DIAGNOSIS — R7989 Other specified abnormal findings of blood chemistry: Secondary | ICD-10-CM

## 2022-06-07 MED ORDER — PEG 3350-KCL-NA BICARB-NACL 420 G PO SOLR: 4000.0000 mL | Freq: Once | ORAL | 0 refills | Status: AC

## 2022-06-07 NOTE — Progress Notes (Addendum)
Referring Provider: Carylon Perches, NP Primary Care Physician:  Carylon Perches, NP Primary GI Physician: Jenetta Downer   Chief Complaint  Patient presents with   Crohn's Disease    Follow up on Crohn's. Has gas and abdominal cramping every time she eats. Patient had positive occult card at physical last week.    HPI:   [name] is a 62 y.o. female with past medical history of ileal Crohn's disease status post small bowel resection, COPD, chronic pancreatitis, rheumatoid arthritis, GERD    Patient presenting today for follow up.  History: CT Entero Abd/Pelvis w contrast: 10/29/21 Severe intra and extrahepatic biliary ductal dilatation. This appears similar to prior noncontrast CT the abdomen and pelvis 05/13/2020, favored to reflect benign post cholecystectomy physiology. However, in the setting of elevated liver function tests, if there is clinical concern for biliary tract obstruction, further evaluation with abdominal MRI with and without IV gadolinium with MRCP would be recommended to exclude the possibility of noncalcified choledocholithiasis or subtle obstructing ampullary lesion.Morphologic changes in the liver indicative of underlying cirrhosis. Morphologic changes in the pancreas of chronic pancreatitis redemonstrated. Borderline splenomegaly.   MRCP thereafter 11/30/21 Notable intrahepatic and extrahepatic biliary dilatation extending to a somewhat irregular ampullary region. This could be from scarring or an underlying stone. The gallbladder is surgically absent. No obvious differentially enhancing mass is identified in the ampulla. 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 Aortic Atherosclerosis (ICD10-I70.0). Trace ascites in the left upper quadrant   Further serologies in light of cirrhosis were done which were negative for AIH, viral hep or wilsons disease, suspect 6-MP and fatty liver contributed to cirrhosis. Again recommended biologic therapy and d/c of 6-MP.  Humira was  started on 01/19/22. Currently doing Humira '40mg'$  every other week.  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 She reports a lot of abdominal cramping. On a bad day she may have up to 8-10 episodes of looser stools, though on average having 2-3 loose stools since starting Humira. Took imodium in the past but was no longer providing much results so currently not taking anything for her loose stools. notes some blood in stools on occasion.  Felt on 6-MP she was having a solid BM daily and rare abdominal cramping so is concerned that Humira is not working well for her.  Next Humira dose is tomorrow. She notes that abdomen has become larger and feels firm since she started on Humira. She denies itching, jaundice or confusion. No SOB or swelling to LEs   Recommend updating MELD labs, INR, AFP, CRP, Fecal cal, Vit D LIver imaging due Feb 2024, Humira drug levels/ab prior to next dose, dexa scan, EGD for esophageal varice screening if plt count <150k, TB/Hep B feb 2024.  Humira drug levels were optimal in December, calprotecin and CRP normal, recommend repeating Fecal calprotecin in 3 months, consider switching therapy to Rinvoq if fecal calprotectin elevated or she continue to have diarrhea. Vitamin d quite low, increased to 5000units/daily. Alk phos elevated to 193, recommend EUS/ERCP for further evaluation given previous biliary ductal dilation in July/August on imaging. Referred to [facility] for this. Plt count 106k, esophageal varice screening to be done with EUS/ERCP.   [facility] for [facility]ERCP as [facility] was very behind on procedures, however, [facility] contacted [place] stating they could not get in touch with the patient on multiple attempts.  Present: She is feeling good today but  reports that a few days after her last injection she had a lot of looser stools. Has had 2 stools today that were soft but formed. She may  have 2 days a week that she feels overall pretty good and having more formed stools. On a bad day she can have upwards of 10 BMs that are usually loose to watery. Appetite is not good. Weight continues to decline. She is having fecal urgency and incontinence. She has abdominal pain usually in lower abdomen and bloating.  She has tried to change her diet, she is avoiding dairy products as this tends to cause more diarrhea and abdominal pain. She denies rectal bleeding or melena. She has some nausea but no vomiting. She is doing ensure BID.   She notes that she never heard from [facility], she attempted to call them and never got through to anyone.   Last Colonoscopy:06/23/2021.  She was found to have neo-terminal ileum contained a moderate stenosis measuring 5 mm (inner diameter) that could not be traversed. This was located 3 cm from the anastomosis. Biopsies were taken with a cold forceps for histology. Diffuse inflammation, moderate in severity and characterized by congestion (edema) and friability was found in the neo-terminal Ileum. Biopsies were taken with a cold forceps for histology.  Biopsies were all normal.  Rest of the colon was completely normal.  She was given prednisone taper. Last Endoscopy:   Past Medical History:  Diagnosis Date   Anorexia 10/01/2010   Arthritis    Chronic pain syndrome    bones and stomach   COPD (chronic obstructive pulmonary disease) ([place])    Crohn's 10/01/2010   Diarrhea 10/01/2010   Emesis 10/01/2010   GERD (gastroesophageal reflux disease)    Lower abdominal pain 10/01/2010   Pancreatitis    Rectal bleeding 10/01/2010    Past Surgical History:  Procedure Laterality Date    Partial Hysterectomy  about 20 years ago   BIOPSY  06/23/2021   Procedure: BIOPSY;  Surgeon: Harvel Quale, MD;  Location[facility];  Service: Gastroenterology;;   [place]   x2   COLONOSCOPY  01/17/2006   October 2007 revealinga ileal  ulcers proximal to anastomosis without stricture and a single rectal ulcer.   COLONOSCOPY WITH PROPOFOL N/A 04/02/2016   Procedure: COLONOSCOPY WITH PROPOFOL;  Surgeon: Rogene Houston, MD;  [facility];  Service: Endoscopy;  Laterality: N/A;  1:00   COLONOSCOPY WITH PROPOFOL N/A 06/23/2021   Procedure: COLONOSCOPY WITH PROPOFOL;  Surgeon: Harvel Quale, MD;  Location: [facility];  Service: Gastroenterology;  Laterality: N/A;  805 ASA 1   ESOPHAGOGASTRODUODENOSCOPY (EGD) WITH PROPOFOL N/A 11/21/2013   Procedure: ESOPHAGOGASTRODUODENOSCOPY (EGD) WITH PROPOFOL;  Surgeon: Rogene Houston, MD;  Location: [facility];  Service: Endoscopy;  Laterality: N/A;   HERNIA REPAIR     TOOTH EXTRACTION Left 10/17/2013   left lower molar    Current Outpatient Medications  Medication Sig Dispense Refill   Adalimumab (HUMIRA PEN) 40 MG/0.4ML PNKT Inject subQ '40mg'$  every other week 2 each 11   ALPRAZolam (XANAX) 0.5 MG tablet Take 0.5 mg by mouth at bedtime as needed for sleep.     Calcium Carbonate-Vitamin D (CALCIUM 500 + D PO) Take 1 tablet by mouth daily.     diclofenac Sodium (VOLTAREN) 1 % GEL Apply 1 application topically 2 (two) times daily as needed (pain).     dicyclomine (BENTYL) 10 MG capsule TAKE1 CAPSULE EVERY  12 HOURS AS NEEDED. 60 capsule 0   famotidine (PEPCID) 20 MG tablet Take 20 mg by mouth daily.     Loperamide HCl (IMODIUM PO) Take by mouth. 2 qam     MAGNESIUM PO Take by mouth.     Multiple Vitamin (MULTIVITAMIN WITH MINERALS) TABS tablet Take 1 tablet by mouth daily.     oxyCODONE 30 MG 12 hr tablet Take 30 mg by mouth daily as needed for pain. 2-3 per day per patient prn.     Turmeric (QC TUMERIC COMPLEX PO) Take by mouth daily at 6 (six) AM.     vitamin B-12 (CYANOCOBALAMIN) 1000 MCG tablet Take 1,000 mcg by mouth daily.     VITAMIN D, CHOLECALCIFEROL, PO Take by mouth. Vit d 5,000 iu takes two daily and one 1,000 iU daily     zinc gluconate 50 MG tablet Take 50  mg by mouth daily.     No current facility-administered medications for this visit.    Allergies as of 06/07/2022   (No Known Allergies)    Family History  Problem Relation Age of Onset   Dementia Mother    Heart attack Mother        x 2   Heart disease Father    Heart attack Father    Diabetes Sister    Healthy Daughter    Healthy Son    Dementia Maternal Grandmother    Cancer Maternal Grandmother        breast    Social History   Socioeconomic History   Marital status: Divorced    Spouse name: Not on file   Number of children: 2   Years of education: Not on file   Highest education level: Not on file  Occupational History   Occupation: disabled  Tobacco Use   Smoking status: Former    Packs/day: 0.25    Years: 35.00    Total pack years: 8.75    Types: Cigarettes    Quit date: 11/29/2015    Years since quitting: 6.5    Passive exposure: Past   Smokeless tobacco: Never  Vaping Use   Vaping Use: Never used  Substance and Sexual Activity   Alcohol use: No   Drug use: No   Sexual activity: Not Currently    Birth control/protection: Surgical    Comment: hyst  Other Topics Concern   Not on file  Social History Narrative   Not on file   Social Determinants of Health   Financial Resource Strain: Low Risk  (06/02/2022)   Overall Financial Resource Strain (CARDIA)    Difficulty of Paying Living Expenses: Not very hard  Food Insecurity: No Food Insecurity (06/02/2022)   Hunger Vital Sign    Worried About Running Out of Food in the Last Year: Never true    Ran Out of Food in the Last Year: Never true  Transportation Needs: No Transportation Needs (06/02/2022)   PRAPARE - Hydrologist (Medical): No    Lack of Transportation (Non-Medical): No  Physical Activity: Insufficiently Active (06/02/2022)   Exercise Vital Sign    Days of Exercise per Week: 3 days    Minutes of Exercise per Session: 30 min  Stress: No Stress Concern Present  (06/02/2022)   [place]    Feeling of Stress : Not at all  Social Connections: Moderately Integrated (06/02/2022)   Social Connection and Isolation Panel [NHANES]    Frequency of  Communication with Friends and Family: More than three times a week    Frequency of Social Gatherings with Friends and Family: Three times a week    Attends Religious Services: More than 4 times per year    Active Member of Clubs or Organizations: Yes    Attends Music therapist: More than 4 times per year    Marital Status: Divorced    Review of systems General: negative for malaise, night sweats, fever, chills, weight loss Neck: Negative for lumps, goiter, pain and significant neck swelling Resp: Negative for cough, wheezing, dyspnea at rest CV: Negative for chest pain, leg swelling, palpitations, orthopnea GI: denies melena, hematochezia, nausea, vomiting, constipation, dysphagia, odyonophagia, early satiety or unintentional weight loss. +diarrhea  MSK: Negative for joint pain or swelling, back pain, and muscle pain. Derm: Negative for itching or rash Psych: Denies depression, anxiety, memory loss, confusion. No homicidal or suicidal ideation.  Heme: Negative for prolonged bleeding, bruising easily, and swollen nodes. Endocrine: Negative for cold or heat intolerance, polyuria, polydipsia and goiter. Neuro: negative for tremor, gait imbalance, syncope and seizures. The remainder of the review of systems is noncontributory.  Physical Exam: BP 122/84 (BP Location: Left Arm, Patient Position: Sitting, Cuff Size: Normal)   Pulse 76   Temp 97.8 F (36.6 C) (Oral)   Ht '5\' 2"'$  (1.575 m)   Wt 110 lb 14.4 oz (50.3 kg)   BMI 20.28 kg/m  General:   Alert and oriented. No distress noted. Pleasant and cooperative.  Head:  Normocephalic and atraumatic. Eyes:  Conjuctiva clear without scleral icterus. Mouth:  Oral mucosa pink and moist.  Good dentition. No lesions. Heart: Normal rate and rhythm, s1 and s2 heart sounds present.  Lungs: Clear lung sounds in all lobes. Respirations equal and unlabored. Abdomen:  +BS, soft, non-tender and non-distended. No rebound or guarding. No HSM or masses noted. Derm: No palmar erythema or jaundice Msk:  Symmetrical without gross deformities. Normal posture. Extremities:  Without edema. Neurologic:  Alert and  oriented x4 Psych:  Alert and cooperative. Normal mood and affect.  Invalid input(s): "6 MONTHS"   ASSESSMENT: [name] is a 62 y.o. female presenting today for follow up of crohn's disease and cirrhosis.   Ileal Crohn's disease/diarrhea: on Humira since October, Humira drug levels were optimal in December, calprotecin and CRP normal. She continues to have diarrhea, can have up to 10 loose to watery stools on a bad day, having about 2 good days per week. Denies rectal bleeding or melena. She is due for colonoscopy in April given Humira was started in October. Query if diarrhea is of other etiology such as pancreatic insufficiency as previous imaging shows pancreatic atrophy and calcific pancreatitis. Will start creon to see if this provides some improvement in her diarrhea and schedule colonoscopy in April. If she is not in endoscopic remission on TCS, may need to consider change in drug therapy, potentially to Rinvoq. Will update Hep B and TB testing as well as vitamin D levels as she was severely deficient in December at 12 and was advised to start vitamin D 5000 units daily. She was also advised to schedule DEXA scan but was lost to follow up for this, I encouraged her to get this scheduled.   Cirrhosis: Has remained relatively well compensated. Labs in December with Alk phos elevated to 193, recommended then to have EUS/ERCP for further evaluation given previous biliary ductal dilation in July/August on imaging. Referred to [facility] for this initially, then  later to [facility] as [facility] could  not get her in for procedure, however, [facility] advised our clinic they were never able to make contact with the patient, she reports she never had any calls from them, will look into this further as it is important that she has this evaluation. Notably, Plt count in December was 106k, she will need esophageal varice screening which can be done with EUS/ERCP. She is due for [facility] screening, will update RUQ [place]   PLAN:  -Start creon samples 2 with meals, 1 with snack - TB/Hep B testing, vit d levels  -RUQ [place]  - ERCP/EUS [facility]-will check on referral  - Repeat Colonoscopy April-ASA III ENDO 3 -Reschedule DEXA scan  -. Continue vitamin D supplement - Continue Humira '40mg'$  every other week - Reduce salt intake to <2 g per day - Can take Tylenol max of 2 g per day (650 mg q8h) for pain - Avoid NSAIDs for pain - Avoid eating raw oysters/shellfish - Ensure every night before going to sleep  All questions were answered, patient verbalized understanding and is in agreement with plan as outlined above.    Follow Up: 3 months   [name] L. 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  This was possibly driven by uncontrolled Crohn's.  Hopefully Humira may be leading to adequate control of her disease.  She is also presenting some diarrhea intermittently which could be related to other causes such as EPI in the setting of chronic pancreatitis (unclear why she had chronic pancreatitis but this was found on imaging performed last year). We will repeat a colonoscopy around April 2024 and attempt to dilate her stricture if there is no presence of ongoing inflammation.  Maylon Peppers, MD Gastroenterology and Hepatology [facility]  Gastroenterology

## 2022-06-07 NOTE — Patient Instructions (Signed)
We will start creon (pancreatic enzymes), take 2 before a meal and 1 with a snack, let me know how this works for you and I can send a prescription   Will update labs and US of the liver I will follow up on referral to [facility] for dilated biliary ducts  Will reschedule dexa scan  Will get you scheduled for colonoscopy for further evaluation Continue humira at current dosage   Follow up 3 months  It was a pleasure to see you today. I want to create trusting relationships with patients and provide genuine, compassionate, and quality care. I truly value your feedback! please be on the lookout for a survey regarding your visit with me today. I appreciate your input about our visit and your time in completing this!    Caitlynne Harbeck L. Alver Sorrow, MSN, APRN, AGNP-C Adult-Gerontology Nurse Practitioner [facility]astroenterology at [facility]

## 2022-06-08 ENCOUNTER — Telehealth (INDEPENDENT_AMBULATORY_CARE_PROVIDER_SITE_OTHER): Payer: Self-pay | Admitting: *Deleted

## 2022-06-08 NOTE — Telephone Encounter (Signed)
Per [facility] patient is not due for TCS until end of April.   Called pt and she is aware will cancel procedure 3/19 and will call back once we receive Dr. Jenetta Downer April schedule. She voiced understanding. She already has prep.

## 2022-06-10 DIAGNOSIS — K838 Other specified diseases of biliary tract: Secondary | ICD-10-CM | POA: Insufficient documentation

## 2022-06-10 DIAGNOSIS — Z9289 Personal history of other medical treatment: Secondary | ICD-10-CM | POA: Insufficient documentation

## 2022-06-10 DIAGNOSIS — R7989 Other specified abnormal findings of blood chemistry: Secondary | ICD-10-CM | POA: Insufficient documentation

## 2022-06-13 NOTE — Addendum Note (Signed)
Addended by: Harvel Quale on: 06/13/2022 11:34 AM   Modules accepted: Level of Service

## 2022-06-14 ENCOUNTER — Other Ambulatory Visit (HOSPITAL_COMMUNITY): Payer: 59

## 2022-06-22 ENCOUNTER — Ambulatory Visit (HOSPITAL_COMMUNITY): Payer: 59

## 2022-06-23 ENCOUNTER — Ambulatory Visit (HOSPITAL_COMMUNITY)
Admission: RE | Admit: 2022-06-23 | Discharge: 2022-06-23 | Disposition: A | Discharge status: Home / Self Care | Payer: 59 | Source: Ambulatory Visit | Attending: Gastroenterology | Admitting: Gastroenterology

## 2022-06-23 DIAGNOSIS — Z7952 Long term (current) use of systemic steroids: Secondary | ICD-10-CM | POA: Diagnosis present

## 2022-06-23 DIAGNOSIS — K50019 Crohn's disease of small intestine with unspecified complications: Secondary | ICD-10-CM | POA: Diagnosis present

## 2022-06-23 DIAGNOSIS — E559 Vitamin D deficiency, unspecified: Secondary | ICD-10-CM | POA: Diagnosis present

## 2022-06-24 ENCOUNTER — Telehealth (INDEPENDENT_AMBULATORY_CARE_PROVIDER_SITE_OTHER): Payer: Self-pay | Admitting: Gastroenterology

## 2022-06-24 NOTE — Telephone Encounter (Signed)
Contacted pt to schedule her TCS. Pt states "when is the last time I had a colonoscopy?" Nurse stated ":march of last year." Pt states "why do I have to have another one so soon. Dr.Castaneda said I didn't need one for 5-7 years and plus I dont think my insurance will pay for another one."  Please advise. Thank you

## 2022-06-24 NOTE — Telephone Encounter (Signed)
Error

## 2022-06-25 LAB — QUANTIFERON-TB GOLD PLUS
Mitogen-NIL: 8.36 IU/mL
NIL: 0.04 IU/mL
QuantiFERON-TB Gold Plus: NEGATIVE
TB1-NIL: 0 IU/mL
TB2-NIL: 0 IU/mL

## 2022-06-25 LAB — HEPATITIS B SURFACE ANTIGEN: Hepatitis B Surface Ag: NONREACTIVE

## 2022-06-25 LAB — VITAMIN D 25 HYDROXY (VIT D DEFICIENCY, FRACTURES): Vit D, 25-Hydroxy: 25 ng/mL — ABNORMAL LOW (ref 30–100)

## 2022-06-25 NOTE — Telephone Encounter (Signed)
Spoke to the patient about the need to proceed with colonoscopy to assess if she has achieved endoscopic improvement of her disease.  She understood this but would like to hold off for this month and she will give [place] a call to schedule this.    If she has persistence of endoscopic inflammation, we will need to switch to a different agent such as Rinvoq.

## 2022-06-28 NOTE — Telephone Encounter (Signed)
Noted  

## 2022-07-05 ENCOUNTER — Other Ambulatory Visit (INDEPENDENT_AMBULATORY_CARE_PROVIDER_SITE_OTHER): Payer: Self-pay | Admitting: Gastroenterology

## 2022-07-05 ENCOUNTER — Other Ambulatory Visit (HOSPITAL_COMMUNITY): Payer: Self-pay | Admitting: Adult Health

## 2022-07-05 DIAGNOSIS — Z1231 Encounter for screening mammogram for malignant neoplasm of breast: Secondary | ICD-10-CM

## 2022-07-05 DIAGNOSIS — K219 Gastro-esophageal reflux disease without esophagitis: Secondary | ICD-10-CM

## 2022-07-06 ENCOUNTER — Encounter (HOSPITAL_COMMUNITY): Payer: Self-pay

## 2022-07-06 ENCOUNTER — Ambulatory Visit (HOSPITAL_COMMUNITY): Admit: 2022-07-06 | Payer: 59 | Admitting: Gastroenterology

## 2022-07-06 ENCOUNTER — Ambulatory Visit (HOSPITAL_COMMUNITY): Payer: 59

## 2022-07-06 SURGERY — COLONOSCOPY WITH PROPOFOL
Anesthesia: Monitor Anesthesia Care

## 2022-07-15 ENCOUNTER — Ambulatory Visit (HOSPITAL_COMMUNITY): Admission: RE | Admit: 2022-07-15 | Payer: 59 | Source: Ambulatory Visit

## 2022-07-21 ENCOUNTER — Ambulatory Visit (HOSPITAL_COMMUNITY)
Admission: RE | Admit: 2022-07-21 | Discharge: 2022-07-21 | Disposition: A | Discharge status: Home / Self Care | Payer: 59 | Source: Ambulatory Visit | Attending: Adult Health | Admitting: Adult Health

## 2022-07-21 DIAGNOSIS — Z1231 Encounter for screening mammogram for malignant neoplasm of breast: Secondary | ICD-10-CM | POA: Diagnosis present

## 2022-07-28 ENCOUNTER — Other Ambulatory Visit: Payer: Self-pay | Admitting: Gastroenterology

## 2022-07-28 ENCOUNTER — Encounter (HOSPITAL_COMMUNITY): Payer: Self-pay

## 2022-07-28 ENCOUNTER — Telehealth: Payer: Self-pay

## 2022-07-28 ENCOUNTER — Ambulatory Visit (HOSPITAL_COMMUNITY): Payer: 59

## 2022-07-28 DIAGNOSIS — R112 Nausea with vomiting, unspecified: Secondary | ICD-10-CM

## 2022-07-28 MED ORDER — ONDANSETRON HCL 4 MG PO TABS: 4.0000 mg | ORAL_TABLET | Freq: Three times a day (TID) | ORAL | 3 refills | Status: DC | PRN

## 2022-07-28 NOTE — Telephone Encounter (Signed)
Patient wants Ondansetron sent into [facility]. She says she had an appointment last week with Dr. Gaynell Face and had asked for a script for the Ondansetron. Dr. Gaynell Face asked the patient to have her gastroenterologist to write this for her. Patient says she just needs this prn. Please send to [facility].

## 2022-07-28 NOTE — Telephone Encounter (Signed)
Patient made aware of all.  

## 2022-07-28 NOTE — Telephone Encounter (Signed)
Prescription sent

## 2022-08-11 ENCOUNTER — Ambulatory Visit (HOSPITAL_COMMUNITY): Payer: 59

## 2022-08-11 ENCOUNTER — Encounter (HOSPITAL_COMMUNITY): Payer: Self-pay

## 2022-09-14 ENCOUNTER — Ambulatory Visit (INDEPENDENT_AMBULATORY_CARE_PROVIDER_SITE_OTHER): Payer: 59 | Admitting: Gastroenterology

## 2022-09-14 ENCOUNTER — Encounter (INDEPENDENT_AMBULATORY_CARE_PROVIDER_SITE_OTHER): Payer: Self-pay

## 2022-09-23 ENCOUNTER — Encounter (INDEPENDENT_AMBULATORY_CARE_PROVIDER_SITE_OTHER): Payer: Self-pay

## 2022-09-23 ENCOUNTER — Telehealth (INDEPENDENT_AMBULATORY_CARE_PROVIDER_SITE_OTHER): Payer: Self-pay | Admitting: *Deleted

## 2022-09-23 ENCOUNTER — Other Ambulatory Visit (INDEPENDENT_AMBULATORY_CARE_PROVIDER_SITE_OTHER): Payer: Self-pay | Admitting: Gastroenterology

## 2022-09-23 DIAGNOSIS — K5000 Crohn's disease of small intestine without complications: Secondary | ICD-10-CM

## 2022-09-23 MED ORDER — DICYCLOMINE HCL 10 MG PO CAPS: ORAL_CAPSULE | ORAL | 1 refills | Status: DC

## 2022-09-23 NOTE — Telephone Encounter (Signed)
Patient aware refill sent. 

## 2022-09-23 NOTE — Telephone Encounter (Signed)
Pt wants to reschedule her appt for some time in July. She said she will check mychart for her appt information.

## 2022-09-23 NOTE — Telephone Encounter (Signed)
Pt requesting refill on dicyclomine. Last seen 06/07/22.  [place] [facility]

## 2022-10-18 ENCOUNTER — Ambulatory Visit (INDEPENDENT_AMBULATORY_CARE_PROVIDER_SITE_OTHER): Payer: 59 | Admitting: Gastroenterology

## 2022-10-18 ENCOUNTER — Encounter (INDEPENDENT_AMBULATORY_CARE_PROVIDER_SITE_OTHER): Payer: Self-pay

## 2022-11-30 ENCOUNTER — Ambulatory Visit (INDEPENDENT_AMBULATORY_CARE_PROVIDER_SITE_OTHER): Payer: 59 | Admitting: Gastroenterology

## 2023-02-06 ENCOUNTER — Other Ambulatory Visit: Payer: Self-pay | Admitting: Gastroenterology

## 2023-02-06 DIAGNOSIS — K50019 Crohn's disease of small intestine with unspecified complications: Secondary | ICD-10-CM

## 2023-02-15 ENCOUNTER — Ambulatory Visit (INDEPENDENT_AMBULATORY_CARE_PROVIDER_SITE_OTHER): Payer: 59 | Admitting: Gastroenterology

## 2023-02-15 ENCOUNTER — Encounter (INDEPENDENT_AMBULATORY_CARE_PROVIDER_SITE_OTHER): Payer: Self-pay | Admitting: Gastroenterology

## 2023-02-15 VITALS — BP 138/68 | HR 55 | Temp 98.4°F | Ht 62.0 in | Wt 113.1 lb

## 2023-02-15 DIAGNOSIS — K7460 Unspecified cirrhosis of liver: Secondary | ICD-10-CM | POA: Diagnosis not present

## 2023-02-15 DIAGNOSIS — Z1321 Encounter for screening for nutritional disorder: Secondary | ICD-10-CM

## 2023-02-15 DIAGNOSIS — K625 Hemorrhage of anus and rectum: Secondary | ICD-10-CM

## 2023-02-15 DIAGNOSIS — K5000 Crohn's disease of small intestine without complications: Secondary | ICD-10-CM

## 2023-02-15 DIAGNOSIS — K219 Gastro-esophageal reflux disease without esophagitis: Secondary | ICD-10-CM

## 2023-02-15 DIAGNOSIS — K7469 Other cirrhosis of liver: Secondary | ICD-10-CM

## 2023-02-15 DIAGNOSIS — E559 Vitamin D deficiency, unspecified: Secondary | ICD-10-CM

## 2023-02-15 DIAGNOSIS — K838 Other specified diseases of biliary tract: Secondary | ICD-10-CM

## 2023-02-15 DIAGNOSIS — R197 Diarrhea, unspecified: Secondary | ICD-10-CM

## 2023-02-15 DIAGNOSIS — R110 Nausea: Secondary | ICD-10-CM

## 2023-02-15 MED ORDER — PEG 3350-KCL-NA BICARB-NACL 420 G PO SOLR: 4000.0000 mL | Freq: Once | ORAL | 0 refills | Status: AC

## 2023-02-15 NOTE — Patient Instructions (Addendum)
-  Continue humira every other week -Schedule Colonoscopy  -we will refer you for EUS/ERCP with [facility]  -due for liver labs  -we will obtain US of your abdomen to screen the liver and evaluate for hernia  -vitamin D levels  -continue zofran as needed for nausea  -increase protonix 40mg  to twice daily dosing for reflux symptoms  - Reduce salt intake to <2 g per day - Can take Tylenol max of 2 g per day (650 mg q8h) for pain - Avoid NSAIDs for pain - Avoid eating raw oysters/shellfish - Ensure every night before going to sleep  Follow up 3 months   It was a pleasure to see you today. I want to create trusting relationships with patients and provide genuine, compassionate, and quality care. I truly value your feedback! please be on the lookout for a survey regarding your visit with me today. I appreciate your input about our visit and your time in completing this!    Maxim Bedel L. Jeanmarie Hubert, MSN, APRN, AGNP-C Adult-Gerontology Nurse Practitioner [facility] Gastroenterology at [facility]

## 2023-02-15 NOTE — H&P (View-Only) (Signed)
 Referring Provider: No ref. provider found Primary Care Physician:  Micki Riley, Harlin Rain, NP Primary GI Physician: Dr. Levon Hedger   Chief Complaint  Patient presents with   Abdominal Pain    Having low abdominal pain and tenderness, feels bloated after eating and drinking, nausea, had blood in stools for about one week straight, multiple loose stools per day.    HPI:   [name] is a 62 y.o. female with past medical history of  ileal Crohn's disease status post small bowel resection, COPD, chronic pancreatitis, rheumatoid arthritis, GERD     Patient presenting today for follow up of Crohn's disease, cirrhosis, EPI  History: CT Entero Abd/Pelvis w contrast: 10/29/21 Severe intra and extrahepatic biliary ductal dilatation. This appears similar to prior noncontrast CT the abdomen and pelvis 05/13/2020, favored to reflect benign post cholecystectomy physiology. However, in the setting of elevated liver function tests, if there is clinical concern for biliary tract obstruction, further evaluation with abdominal MRI with and without IV gadolinium with MRCP would be recommended to exclude the possibility of noncalcified choledocholithiasis or subtle obstructing ampullary lesion.Morphologic changes in the liver indicative of underlying cirrhosis. Morphologic changes in the pancreas of chronic pancreatitis redemonstrated. Borderline splenomegaly.   MRCP thereafter 11/30/21 Notable intrahepatic and extrahepatic biliary dilatation extending to a somewhat irregular ampullary region. This could be from scarring or an underlying stone. The gallbladder is surgically absent. No obvious differentially enhancing mass is identified in the ampulla. 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 Aortic Atherosclerosis (ICD10-I70.0). Trace ascites in the left upper quadrant   Further serologies in light of cirrhosis were done which were negative for AIH, viral hep or wilsons disease, suspect 6-MP and fatty  liver contributed to cirrhosis. Again recommended biologic therapy and d/c of 6-MP.   Humira was started on 01/19/22. Currently doing Humira 40mg  every other week.  Last seen February 2024, at that time reported having some looser stools though overall having more formed stools.  On a bad day can have upwards of 10 BMs that are loose to watery.  Appetite not great and weight continues to decline.  Having fecal urgency and incontinence.  Having some lower abdominal pain and bloating.  Had not yet heard from [facility] to schedule EUS/ERCP.  Recommended to start Creon, TB hep B testing, vitamin D levels, right quadrant ultrasound, ERCP/EUS at [facility], repeat colonoscopy in April 2024, reschedule DEXA scan, continuing vitamin D supplementation, continue Humira 40 mg every other week  Patient did not schedule Colonoscopy, did not have ERCP/EUS at [facility] as recommended, did not complete RUQ [place]  Hep B and TB testing in march 2024 was negative Vit d 25 in march 2024 DEXA in march 2024 showed osteoporosis, recommended to follow up with PCP, continue vitamin D/calcium supplementation Last MELD labs December 2023  Last liver imaging via MRCP August 2023 as above CRP and fecal calprotectin WNL December 2023 Humira drug levels optimal at 12.4 in December 2023  Present:  She is having a lot of BMs, noting symptoms worse in the morning. She had 1 weeks of passing BRBPR about 2 weeks ago, still having some rectal bleeding intermittently. Stools are sometimes formed though she notes more looser stools at times. She is passing some mucus per rectum sometimes as well.  She is having a lot of heartburn and pressure in her chest and stomach after eating. She has tried gas x which seemed to help quite a bit the first day she took it then did not provide  any relief thereafter. She is taking. She was started back on pantoprazole 40mg  once daily by her pain management doctor. She has not noticed much improvement with  protonix. She notes a lot of soreness in her sides upon waking in the morning. She notes that her abdomen tends to bloat/swell even after just drinking water in the morning. Abdomen gets very tight at times. She is having some swelling in her legs as well. Denies episodes of confusion. Denies any melena or hematemesis/coffee ground emesis.   She is taking zofran PRN for nausea.   She states she thinks [facility] called to schedule ERCP/EUS but she missed the call and was unable to get back in touch with them. She Would like to go to [employer] for this if possible.   She is unsure if she tried creon samples given at last visit.   Previous MELD 3.0 03/2022 - 13  Cirrhosis related questions: Episodes of confusion/disorientation: none  Taking diuretics? None  Beta blockers? no Prior history of variceal banding? None  Prior episodes of SBP? None  Last liver imaging: MRCP 11/2021 as outlined above  Alcohol use: none    Last Colonoscopy:06/23/2021.  She was found to have neo-terminal ileum contained a moderate stenosis measuring 5 mm (inner diameter) that could not be traversed. This was located 3 cm from the anastomosis. Biopsies were taken with a cold forceps for histology. Diffuse inflammation, moderate in severity and characterized by congestion (edema) and friability was found in the neo-terminal Ileum. Biopsies were taken with a cold forceps for histology.  Biopsies were all normal.  Rest of the colon was completely normal.  She was given prednisone taper. Last Endoscopy:  Recommendations:    Past Medical History:  Diagnosis Date   Anorexia 10/01/2010   Arthritis    Chronic pain syndrome    bones and stomach   COPD (chronic obstructive pulmonary disease) (HCC)    Crohn's 10/01/2010   Diarrhea 10/01/2010   Emesis 10/01/2010   GERD (gastroesophageal reflux disease)    Lower abdominal pain 10/01/2010   Pancreatitis    Rectal bleeding 10/01/2010    Past Surgical History:  Procedure  Laterality Date    Partial Hysterectomy  about 20 years ago   BIOPSY  06/23/2021   Procedure: BIOPSY;  Surgeon: Dolores Frame, MD;  Location: [facility];  Service: Gastroenterology;;   CHOLECYSTECTOMY     COLECTOMY  1995   x2   COLONOSCOPY  01/17/2006   October 2007 revealinga ileal ulcers proximal to anastomosis without stricture and a single rectal ulcer.   COLONOSCOPY WITH PROPOFOL N/A 04/02/2016   Procedure: COLONOSCOPY WITH PROPOFOL;  Surgeon: Malissa Hippo, MD;  Location: [facility];  Service: Endoscopy;  Laterality: N/A;  1:00   COLONOSCOPY WITH PROPOFOL N/A 06/23/2021   Procedure: COLONOSCOPY WITH PROPOFOL;  Surgeon: Dolores Frame, MD;  Location: [facility];  Service: Gastroenterology;  Laterality: N/A;  805 ASA 1   ESOPHAGOGASTRODUODENOSCOPY (EGD) WITH PROPOFOL N/A 11/21/2013   Procedure: ESOPHAGOGASTRODUODENOSCOPY (EGD) WITH PROPOFOL;  Surgeon: Malissa Hippo, MD;  Location: [facility];  Service: Endoscopy;  Laterality: N/A;   HERNIA REPAIR     TOOTH EXTRACTION Left 10/17/2013   left lower molar    Current Outpatient Medications  Medication Sig Dispense Refill   traZODone (DESYREL) 50 MG tablet Take 50 mg by mouth at bedtime.     adalimumab (HUMIRA, 2 PEN,) 40 MG/0.4ML pen INJECT 40 MG INTO THE SKIN EVERY OTHER WEEK. 2 each  4   Calcium Carbonate-Vitamin D (CALCIUM 500 + D PO) Take 1 tablet by mouth daily.     diclofenac Sodium (VOLTAREN) 1 % GEL Apply 1 application topically 2 (two) times daily as needed (pain).     famotidine (PEPCID) 20 MG tablet Take 20 mg by mouth daily.     Loperamide HCl (IMODIUM PO) Take by mouth. 2 qam     MAGNESIUM PO Take by mouth.     Multiple Vitamin (MULTIVITAMIN WITH MINERALS) TABS tablet Take 1 tablet by mouth daily.     ondansetron (ZOFRAN) 4 MG tablet Take 1 tablet (4 mg total) by mouth every 8 (eight) hours as needed for nausea or vomiting. 90 tablet 3   oxyCODONE 30 MG 12 hr tablet Take 30 mg by mouth daily as  needed for pain. 2-3 per day per patient prn.     Turmeric (QC TUMERIC COMPLEX PO) Take by mouth daily at 6 (six) AM.     vitamin B-12 (CYANOCOBALAMIN) 1000 MCG tablet Take 1,000 mcg by mouth daily.     VITAMIN D, CHOLECALCIFEROL, PO Take by mouth. Vit d 5,000 iu takes two daily and one 1,000 iU daily     zinc gluconate 50 MG tablet Take 50 mg by mouth daily.     No current facility-administered medications for this visit.    Allergies as of 02/15/2023   (No Known Allergies)    Family History  Problem Relation Age of Onset   Dementia Mother    Heart attack Mother        x 2   Heart disease Father    Heart attack Father    Diabetes Sister    Healthy Daughter    Healthy Son    Dementia Maternal Grandmother    Cancer Maternal Grandmother        breast    Social History   Socioeconomic History   Marital status: Divorced    Spouse name: Not on file   Number of children: 2   Years of education: Not on file   Highest education level: Not on file  Occupational History   Occupation: disabled  Tobacco Use   Smoking status: Former    Current packs/day: 0.00    Average packs/day: 0.3 packs/day for 35.0 years (8.8 ttl pk-yrs)    Types: Cigarettes    Start date: 11/28/1980    Quit date: 11/29/2015    Years since quitting: 7.2    Passive exposure: Past   Smokeless tobacco: Never  Vaping Use   Vaping status: Never Used  Substance and Sexual Activity   Alcohol use: No   Drug use: No   Sexual activity: Not Currently    Birth control/protection: Surgical    Comment: hyst  Other Topics Concern   Not on file  Social History Narrative   Not on file   Social Determinants of Health   Financial Resource Strain: Low Risk  (06/02/2022)   Overall Financial Resource Strain (CARDIA)    Difficulty of Paying Living Expenses: Not very hard  Food Insecurity: No Food Insecurity (06/02/2022)   Hunger Vital Sign    Worried About Running Out of Food in the Last Year: Never true    Ran Out  of Food in the Last Year: Never true  Transportation Needs: No Transportation Needs (06/02/2022)   PRAPARE - Administrator, Civil Service (Medical): No    Lack of Transportation (Non-Medical): No  Physical Activity: Insufficiently Active (06/02/2022)   Exercise  Vital Sign    Days of Exercise per Week: 3 days    Minutes of Exercise per Session: 30 min  Stress: No Stress Concern Present (06/02/2022)   [employer] of Occupational Health - Occupational Stress Questionnaire    Feeling of Stress : Not at all  Social Connections: Moderately Integrated (06/02/2022)   Social Connection and Isolation Panel [NHANES]    Frequency of Communication with Friends and Family: More than three times a week    Frequency of Social Gatherings with Friends and Family: Three times a week    Attends Religious Services: More than 4 times per year    Active Member of Clubs or Organizations: Yes    Attends Engineer, structural: More than 4 times per year    Marital Status: Divorced    Review of systems General: negative for malaise, night sweats, fever, chills, weight loss Neck: Negative for lumps, goiter, pain and significant neck swelling Resp: Negative for cough, wheezing, dyspnea at rest CV: Negative for chest pain, leg swelling, palpitations, orthopnea GI: denies melena, vomiting constipation, dysphagia, odyonophagia, early satiety or unintentional weight loss. +diarrhea +nausea +GERD +abdominal swelling +rectal bleeding  MSK: Negative for joint pain or swelling, back pain, and muscle pain. Derm: Negative for itching or rash Psych: Denies depression, anxiety, memory loss, confusion. No homicidal or suicidal ideation.  Heme: Negative for prolonged bleeding, bruising easily, and swollen nodes. Endocrine: Negative for cold or heat intolerance, polyuria, polydipsia and goiter. Neuro: negative for tremor, gait imbalance, syncope and seizures. The remainder of the review of systems is  noncontributory.  Physical Exam: There were no vitals taken for this visit. General:   Alert and oriented. No distress noted. Pleasant and cooperative.  Head:  Normocephalic and atraumatic. Eyes:  Conjuctiva clear without scleral icterus. Mouth:  Oral mucosa pink and moist. Good dentition. No lesions. Heart: Normal rate and rhythm, s1 and s2 heart sounds present.  Lungs: Clear lung sounds in all lobes. Respirations equal and unlabored. Abdomen:  +BS, soft, non-tender. Abdomen notably distended only in lower abdomen with what appears to be a ventral hernia, no obvious ascites. No rebound or guarding. No HSM or masses noted. Derm: No palmar erythema or jaundice Msk:  Symmetrical without gross deformities. Normal posture. Extremities:  Without edema. Neurologic:  Alert and  oriented x4 Psych:  Alert and cooperative. Normal mood and affect.  Invalid input(s): "6 MONTHS"   ASSESSMENT: TALITHA DICARLO is a 62 y.o. female presenting today   Crohn's disease: History of ileal Crohn's disease, on Humira since October 2023 with Humira drug levels optimal in December 2023, calprotectin and CRP normal.  She has continued to have intermittent diarrhea.  She was given Creon samples at last visit as it was considered her diarrhea may be related to EPI however she is unsure if she ever tried these.  She was also recommended to have colonoscopy in April which she was lost to follow-up for.  She notes some intermittent rectal bleeding and some mucus per rectum at this time as well.  Would recommend proceeding with colonoscopy for further evaluation of her symptoms as of her Crohn's is not in remission we may need to consider switching to a different therapy such as Rinvoq. If her crohn's appears to be in endoscopic remission, will consider addition of Creon for possible EPI given pancreatic atrophy noted on serial imaging. She is up-to-date on TB and hepatitis B screenings, she also had a DEXA scan earlier this  year.  She has history of vitamin D deficiency and is currently on 50,000 units weekly.  Will check basic labs and vitamin D levels and get her scheduled for colonoscopy.  Will continue with Humira every other week for now.  Cirrhosis: Cirrhosis appears relatively well compensated.  She notably had elevated alkaline phosphatase in December 2023 to 193, she also had biliary dilation noted on MRCP in August 2023 and was recommended to have EUS/ERCP.  She was referred to [facility] but they were unable to get her in so she was then referred to [facility] but she has yet to schedule EUS/ERCP with them. She is amenable to having this done but requesting we try to get her in with [facility] as she does not drive in [place] and would be difficult to find a ride there for the procedure.   She denies any episodes of confusion.  She has never had an upper endoscopy.  Platelet count in December 2023 was 106k, screening for EVs was warranted, however planned for EV screening to be done at time of EUS/ERCP which as above, was not done. I did discuss importance of EV screening with the patient. She is due for MELD labs, AFP, liver imaging for [facility] screening.   GERD/Chest pressure/nausea:She is having a lot of heartburn and pressure in her chest and stomach after eating. She has tried gas x which has helped mildly. was started back on pantoprazole 40mg  once daily by her pain management doctor though not noting much improvement with this. She notes a lot of soreness in her sides upon waking in the morning. She notes that her abdomen tends to bloat/swell even after just drinking water in the morning. She is having some swelling in her legs as well when she is up on her feet alot. Notably her lower abdomen is distended but soft on exam today, however, upper abdomen appears flat. I do not feel that her presentation is consistent with ascites. She has history of hernia repair in the past and exam appears more consistent with a large  hernia, further evaluation of this to be done with Abdominal US as above for [facility] screening. Will also increase PPI to BID dosing to see if this helps with her GERD symptoms.   PLAN:  -Continue humira every other week -Schedule Colonoscopy ASA IIIre -refer for EUS/ERCP with [facility]  -MELD labs -AFP, CBC -Abd [place] complete -vitamin D levels  -increase Protonix 40mg  to BID dosing  -continue vitamin D supplementation  -continue zofran PRN  - Reduce salt intake to <2 g per day - Can take Tylenol max of 2 g per day (650 mg q8h) for pain - Avoid NSAIDs for pain - Avoid eating raw oysters/shellfish - Ensure every night before going to sleep -consider addition of creon for diarrhea if Colonoscopy shows endoscopic remission   All questions were answered, patient verbalized understanding and is in agreement with plan as outlined above.    Follow Up: 3 months   Roosvelt Churchwell L. Jeanmarie Hubert, MSN, APRN, AGNP-C Adult-Gerontology Nurse Practitioner [facility]  I have reviewed the note and agree with the APP's assessment as described in this progress note  If endoscopic remission is found on repeat colonoscopy, may consider proceeding with a capsule endoscopy with prior patency capsule to determine if there is any active disease in the rest of the small bowel.  May also add Creon at that time to improve her bowel movements.  If persistent thrombocytopenia, we will need to add on  esophagogastroduodenospy to her upcoming colonoscopy slot.  Katrinka Blazing, MD Gastroenterology and Hepatology [facility]astro[facility]

## 2023-02-15 NOTE — Progress Notes (Addendum)
Referring Provider: No ref. provider found Primary Care Physician:  Micki Riley, Harlin Rain, NP Primary GI Physician: Dr. Levon Hedger   Chief Complaint  Patient presents with   Abdominal Pain    Having low abdominal pain and tenderness, feels bloated after eating and drinking, nausea, had blood in stools for about one week straight, multiple loose stools per day.    HPI:   [name] is a 62 y.o. female with past medical history of  ileal Crohn's disease status post small bowel resection, COPD, chronic pancreatitis, rheumatoid arthritis, GERD     Patient presenting today for follow up of Crohn's disease, cirrhosis, EPI  History: CT Entero Abd/Pelvis w contrast: 10/29/21 Severe intra and extrahepatic biliary ductal dilatation. This appears similar to prior noncontrast CT the abdomen and pelvis 05/13/2020, favored to reflect benign post cholecystectomy physiology. However, in the setting of elevated liver function tests, if there is clinical concern for biliary tract obstruction, further evaluation with abdominal MRI with and without IV gadolinium with MRCP would be recommended to exclude the possibility of noncalcified choledocholithiasis or subtle obstructing ampullary lesion.Morphologic changes in the liver indicative of underlying cirrhosis. Morphologic changes in the pancreas of chronic pancreatitis redemonstrated. Borderline splenomegaly.   MRCP thereafter 11/30/21 Notable intrahepatic and extrahepatic biliary dilatation extending to a somewhat irregular ampullary region. This could be from scarring or an underlying stone. The gallbladder is surgically absent. No obvious differentially enhancing mass is identified in the ampulla. 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 Aortic Atherosclerosis (ICD10-I70.0). Trace ascites in the left upper quadrant   Further serologies in light of cirrhosis were done which were negative for AIH, viral hep or wilsons disease, suspect 6-MP and fatty  liver contributed to cirrhosis. Again recommended biologic therapy and d/c of 6-MP.   Humira was started on 01/19/22. Currently doing Humira 40mg  every other week.  Last seen February 2024, at that time reported having some looser stools though overall having more formed stools.  On a bad day can have upwards of 10 BMs that are loose to watery.  Appetite not great and weight continues to decline.  Having fecal urgency and incontinence.  Having some lower abdominal pain and bloating.  Had not yet heard from [facility] to schedule EUS/ERCP.  Recommended to start Creon, TB hep B testing, vitamin D levels, right quadrant ultrasound, ERCP/EUS at [facility], repeat colonoscopy in April 2024, reschedule DEXA scan, continuing vitamin D supplementation, continue Humira 40 mg every other week  Patient did not schedule Colonoscopy, did not have ERCP/EUS at [facility] as recommended, did not complete RUQ [place]  Hep B and TB testing in march 2024 was negative Vit d 25 in march 2024 DEXA in march 2024 showed osteoporosis, recommended to follow up with PCP, continue vitamin D/calcium supplementation Last MELD labs December 2023  Last liver imaging via MRCP August 2023 as above CRP and fecal calprotectin WNL December 2023 Humira drug levels optimal at 12.4 in December 2023  Present:  She is having a lot of BMs, noting symptoms worse in the morning. She had 1 weeks of passing BRBPR about 2 weeks ago, still having some rectal bleeding intermittently. Stools are sometimes formed though she notes more looser stools at times. She is passing some mucus per rectum sometimes as well.  She is having a lot of heartburn and pressure in her chest and stomach after eating. She has tried gas x which seemed to help quite a bit the first day she took it then did not provide  any relief thereafter. She is taking. She was started back on pantoprazole 40mg  once daily by her pain management doctor. She has not noticed much improvement with  protonix. She notes a lot of soreness in her sides upon waking in the morning. She notes that her abdomen tends to bloat/swell even after just drinking water in the morning. Abdomen gets very tight at times. She is having some swelling in her legs as well. Denies episodes of confusion. Denies any melena or hematemesis/coffee ground emesis.   She is taking zofran PRN for nausea.   She states she thinks [facility] called to schedule ERCP/EUS but she missed the call and was unable to get back in touch with them. She Would like to go to [facility] for this if possible.   She is unsure if she tried creon samples given at last visit.   Previous MELD 3.0 03/2022 - 13  Cirrhosis related questions: Episodes of confusion/disorientation: none  Taking diuretics? None  Beta blockers? no Prior history of variceal banding? None  Prior episodes of SBP? None  Last liver imaging: MRCP 11/2021 as outlined above  Alcohol use: none    Last Colonoscopy:06/23/2021.  She was found to have neo-terminal ileum contained a moderate stenosis measuring 5 mm (inner diameter) that could not be traversed. This was located 3 cm from the anastomosis. Biopsies were taken with a cold forceps for histology. Diffuse inflammation, moderate in severity and characterized by congestion (edema) and friability was found in the neo-terminal Ileum. Biopsies were taken with a cold forceps for histology.  Biopsies were all normal.  Rest of the colon was completely normal.  She was given prednisone taper. Last Endoscopy:  Recommendations:    Past Medical History:  Diagnosis Date   Anorexia 10/01/2010   Arthritis    Chronic pain syndrome    bones and stomach   COPD (chronic obstructive pulmonary disease) (HCC)    Crohn's 10/01/2010   Diarrhea 10/01/2010   Emesis 10/01/2010   GERD (gastroesophageal reflux disease)    Lower abdominal pain 10/01/2010   Pancreatitis    Rectal bleeding 10/01/2010    Past Surgical History:  Procedure  Laterality Date    Partial Hysterectomy  about 20 years ago   BIOPSY  06/23/2021   Procedure: BIOPSY;  Surgeon: Dolores Frame, MD;  Location: [facility];  Service: Gastroenterology;;   CHOLECYSTECTOMY     COLECTOMY  1995   x2   COLONOSCOPY  01/17/2006   October 2007 revealinga ileal ulcers proximal to anastomosis without stricture and a single rectal ulcer.   COLONOSCOPY WITH PROPOFOL N/A 04/02/2016   Procedure: COLONOSCOPY WITH PROPOFOL;  Surgeon: Malissa Hippo, MD;  Location: [facility];  Service: Endoscopy;  Laterality: N/A;  1:00   COLONOSCOPY WITH PROPOFOL N/A 06/23/2021   Procedure: COLONOSCOPY WITH PROPOFOL;  Surgeon: Dolores Frame, MD;  Location: [facility];  Service: Gastroenterology;  Laterality: N/A;  805 ASA 1   ESOPHAGOGASTRODUODENOSCOPY (EGD) WITH PROPOFOL N/A 11/21/2013   Procedure: ESOPHAGOGASTRODUODENOSCOPY (EGD) WITH PROPOFOL;  Surgeon: Malissa Hippo, MD;  Location: [facility];  Service: Endoscopy;  Laterality: N/A;   HERNIA REPAIR     TOOTH EXTRACTION Left 10/17/2013   left lower molar    Current Outpatient Medications  Medication Sig Dispense Refill   traZODone (DESYREL) 50 MG tablet Take 50 mg by mouth at bedtime.     adalimumab (HUMIRA, 2 PEN,) 40 MG/0.4ML pen INJECT 40 MG INTO THE SKIN EVERY OTHER WEEK. 2 each  4   Calcium Carbonate-Vitamin D (CALCIUM 500 + D PO) Take 1 tablet by mouth daily.     diclofenac Sodium (VOLTAREN) 1 % GEL Apply 1 application topically 2 (two) times daily as needed (pain).     famotidine (PEPCID) 20 MG tablet Take 20 mg by mouth daily.     Loperamide HCl (IMODIUM PO) Take by mouth. 2 qam     MAGNESIUM PO Take by mouth.     Multiple Vitamin (MULTIVITAMIN WITH MINERALS) TABS tablet Take 1 tablet by mouth daily.     ondansetron (ZOFRAN) 4 MG tablet Take 1 tablet (4 mg total) by mouth every 8 (eight) hours as needed for nausea or vomiting. 90 tablet 3   oxyCODONE 30 MG 12 hr tablet Take 30 mg by mouth daily as  needed for pain. 2-3 per day per patient prn.     Turmeric (QC TUMERIC COMPLEX PO) Take by mouth daily at 6 (six) AM.     vitamin B-12 (CYANOCOBALAMIN) 1000 MCG tablet Take 1,000 mcg by mouth daily.     VITAMIN D, CHOLECALCIFEROL, PO Take by mouth. Vit d 5,000 iu takes two daily and one 1,000 iU daily     zinc gluconate 50 MG tablet Take 50 mg by mouth daily.     No current facility-administered medications for this visit.    Allergies as of 02/15/2023   (No Known Allergies)    Family History  Problem Relation Age of Onset   Dementia Mother    Heart attack Mother        x 2   Heart disease Father    Heart attack Father    Diabetes Sister    Healthy Daughter    Healthy Son    Dementia Maternal Grandmother    Cancer Maternal Grandmother        breast    Social History   Socioeconomic History   Marital status: Divorced    Spouse name: Not on file   Number of children: 2   Years of education: Not on file   Highest education level: Not on file  Occupational History   Occupation: disabled  Tobacco Use   Smoking status: Former    Current packs/day: 0.00    Average packs/day: 0.3 packs/day for 35.0 years (8.8 ttl pk-yrs)    Types: Cigarettes    Start date: 11/28/1980    Quit date: 11/29/2015    Years since quitting: 7.2    Passive exposure: Past   Smokeless tobacco: Never  Vaping Use   Vaping status: Never Used  Substance and Sexual Activity   Alcohol use: No   Drug use: No   Sexual activity: Not Currently    Birth control/protection: Surgical    Comment: hyst  Other Topics Concern   Not on file  Social History Narrative   Not on file   Social Determinants of Health   Financial Resource Strain: Low Risk  (06/02/2022)   Overall Financial Resource Strain (CARDIA)    Difficulty of Paying Living Expenses: Not very hard  Food Insecurity: No Food Insecurity (06/02/2022)   Hunger Vital Sign    Worried About Running Out of Food in the Last Year: Never true    Ran Out  of Food in the Last Year: Never true  Transportation Needs: No Transportation Needs (06/02/2022)   PRAPARE - Administrator, Civil Service (Medical): No    Lack of Transportation (Non-Medical): No  Physical Activity: Insufficiently Active (06/02/2022)   Exercise  Vital Sign    Days of Exercise per Week: 3 days    Minutes of Exercise per Session: 30 min  Stress: No Stress Concern Present (06/02/2022)   [employer] of Occupational Health - Occupational Stress Questionnaire    Feeling of Stress : Not at all  Social Connections: Moderately Integrated (06/02/2022)   Social Connection and Isolation Panel [NHANES]    Frequency of Communication with Friends and Family: More than three times a week    Frequency of Social Gatherings with Friends and Family: Three times a week    Attends Religious Services: More than 4 times per year    Active Member of Clubs or Organizations: Yes    Attends Engineer, structural: More than 4 times per year    Marital Status: Divorced    Review of systems General: negative for malaise, night sweats, fever, chills, weight loss Neck: Negative for lumps, goiter, pain and significant neck swelling Resp: Negative for cough, wheezing, dyspnea at rest CV: Negative for chest pain, leg swelling, palpitations, orthopnea GI: denies melena, vomiting constipation, dysphagia, odyonophagia, early satiety or unintentional weight loss. +diarrhea +nausea +GERD +abdominal swelling +rectal bleeding  MSK: Negative for joint pain or swelling, back pain, and muscle pain. Derm: Negative for itching or rash Psych: Denies depression, anxiety, memory loss, confusion. No homicidal or suicidal ideation.  Heme: Negative for prolonged bleeding, bruising easily, and swollen nodes. Endocrine: Negative for cold or heat intolerance, polyuria, polydipsia and goiter. Neuro: negative for tremor, gait imbalance, syncope and seizures. The remainder of the review of systems is  noncontributory.  Physical Exam: There were no vitals taken for this visit. General:   Alert and oriented. No distress noted. Pleasant and cooperative.  Head:  Normocephalic and atraumatic. Eyes:  Conjuctiva clear without scleral icterus. Mouth:  Oral mucosa pink and moist. Good dentition. No lesions. Heart: Normal rate and rhythm, s1 and s2 heart sounds present.  Lungs: Clear lung sounds in all lobes. Respirations equal and unlabored. Abdomen:  +BS, soft, non-tender. Abdomen notably distended only in lower abdomen with what appears to be a ventral hernia, no obvious ascites. No rebound or guarding. No HSM or masses noted. Derm: No palmar erythema or jaundice Msk:  Symmetrical without gross deformities. Normal posture. Extremities:  Without edema. Neurologic:  Alert and  oriented x4 Psych:  Alert and cooperative. Normal mood and affect.  Invalid input(s): "6 MONTHS"   ASSESSMENT: TALITHA DICARLO is a 62 y.o. female presenting today   Crohn's disease: History of ileal Crohn's disease, on Humira since October 2023 with Humira drug levels optimal in December 2023, calprotectin and CRP normal.  She has continued to have intermittent diarrhea.  She was given Creon samples at last visit as it was considered her diarrhea may be related to EPI however she is unsure if she ever tried these.  She was also recommended to have colonoscopy in April which she was lost to follow-up for.  She notes some intermittent rectal bleeding and some mucus per rectum at this time as well.  Would recommend proceeding with colonoscopy for further evaluation of her symptoms as of her Crohn's is not in remission we may need to consider switching to a different therapy such as Rinvoq. If her crohn's appears to be in endoscopic remission, will consider addition of Creon for possible EPI given pancreatic atrophy noted on serial imaging. She is up-to-date on TB and hepatitis B screenings, she also had a DEXA scan earlier this  year.  She has history of vitamin D deficiency and is currently on 50,000 units weekly.  Will check basic labs and vitamin D levels and get her scheduled for colonoscopy.  Will continue with Humira every other week for now.  Cirrhosis: Cirrhosis appears relatively well compensated.  She notably had elevated alkaline phosphatase in December 2023 to 193, she also had biliary dilation noted on MRCP in August 2023 and was recommended to have EUS/ERCP.  She was referred to [facility] but they were unable to get her in so she was then referred to [facility] but she has yet to schedule EUS/ERCP with them. She is amenable to having this done but requesting we try to get her in with [facility] as she does not drive in [place] and would be difficult to find a ride there for the procedure.   She denies any episodes of confusion.  She has never had an upper endoscopy.  Platelet count in December 2023 was 106k, screening for EVs was warranted, however planned for EV screening to be done at time of EUS/ERCP which as above, was not done. I did discuss importance of EV screening with the patient. She is due for MELD labs, AFP, liver imaging for [facility] screening.   GERD/Chest pressure/nausea:She is having a lot of heartburn and pressure in her chest and stomach after eating. She has tried gas x which has helped mildly. was started back on pantoprazole 40mg  once daily by her pain management doctor though not noting much improvement with this. She notes a lot of soreness in her sides upon waking in the morning. She notes that her abdomen tends to bloat/swell even after just drinking water in the morning. She is having some swelling in her legs as well when she is up on her feet alot. Notably her lower abdomen is distended but soft on exam today, however, upper abdomen appears flat. I do not feel that her presentation is consistent with ascites. She has history of hernia repair in the past and exam appears more consistent with a large  hernia, further evaluation of this to be done with Abdominal US as above for [facility] screening. Will also increase PPI to BID dosing to see if this helps with her GERD symptoms.   PLAN:  -Continue humira every other week -Schedule Colonoscopy ASA IIIre -refer for EUS/ERCP with [facility]  -MELD labs -AFP, CBC -Abd [place] complete -vitamin D levels  -increase Protonix 40mg  to BID dosing  -continue vitamin D supplementation  -continue zofran PRN  - Reduce salt intake to <2 g per day - Can take Tylenol max of 2 g per day (650 mg q8h) for pain - Avoid NSAIDs for pain - Avoid eating raw oysters/shellfish - Ensure every night before going to sleep -consider addition of creon for diarrhea if Colonoscopy shows endoscopic remission   All questions were answered, patient verbalized understanding and is in agreement with plan as outlined above.    Follow Up: 3 months   Roosvelt Churchwell L. Jeanmarie Hubert, MSN, APRN, AGNP-C Adult-Gerontology Nurse Practitioner [facility]  I have reviewed the note and agree with the APP's assessment as described in this progress note  If endoscopic remission is found on repeat colonoscopy, may consider proceeding with a capsule endoscopy with prior patency capsule to determine if there is any active disease in the rest of the small bowel.  May also add Creon at that time to improve her bowel movements.  If persistent thrombocytopenia, we will need to add on  esophagogastroduodenospy to her upcoming colonoscopy slot.  Katrinka Blazing, MD Gastroenterology and Hepatology [facility] Gastroenterology

## 2023-02-16 ENCOUNTER — Other Ambulatory Visit (INDEPENDENT_AMBULATORY_CARE_PROVIDER_SITE_OTHER): Payer: Self-pay | Admitting: Gastroenterology

## 2023-02-16 ENCOUNTER — Encounter (INDEPENDENT_AMBULATORY_CARE_PROVIDER_SITE_OTHER): Payer: Self-pay

## 2023-02-16 DIAGNOSIS — R110 Nausea: Secondary | ICD-10-CM | POA: Insufficient documentation

## 2023-02-17 ENCOUNTER — Telehealth (INDEPENDENT_AMBULATORY_CARE_PROVIDER_SITE_OTHER): Payer: Self-pay

## 2023-02-17 LAB — CBC
HCT: 32.5 % — ABNORMAL LOW (ref 35.0–45.0)
Hemoglobin: 10.4 g/dL — ABNORMAL LOW (ref 11.7–15.5)
MCH: 31 pg (ref 27.0–33.0)
MCHC: 32 g/dL (ref 32.0–36.0)
MCV: 97 fL (ref 80.0–100.0)
MPV: 12.4 fL (ref 7.5–12.5)
Platelets: 136 10*3/uL — ABNORMAL LOW (ref 140–400)
RBC: 3.35 10*6/uL — ABNORMAL LOW (ref 3.80–5.10)
RDW: 12.5 % (ref 11.0–15.0)
WBC: 5.4 10*3/uL (ref 3.8–10.8)

## 2023-02-17 LAB — COMPREHENSIVE METABOLIC PANEL
AG Ratio: 1 (calc) (ref 1.0–2.5)
ALT: 23 U/L (ref 6–29)
AST: 40 U/L — ABNORMAL HIGH (ref 10–35)
Albumin: 3.2 g/dL — ABNORMAL LOW (ref 3.6–5.1)
Alkaline phosphatase (APISO): 147 U/L (ref 37–153)
BUN/Creatinine Ratio: 7 (calc) (ref 6–22)
BUN: 5 mg/dL — ABNORMAL LOW (ref 7–25)
CO2: 26 mmol/L (ref 20–32)
Calcium: 8.1 mg/dL — ABNORMAL LOW (ref 8.6–10.4)
Chloride: 104 mmol/L (ref 98–110)
Creat: 0.73 mg/dL (ref 0.50–1.05)
Globulin: 3.1 g/dL (ref 1.9–3.7)
Glucose, Bld: 77 mg/dL (ref 65–99)
Potassium: 4 mmol/L (ref 3.5–5.3)
Sodium: 139 mmol/L (ref 135–146)
Total Bilirubin: 0.3 mg/dL (ref 0.2–1.2)
Total Protein: 6.3 g/dL (ref 6.1–8.1)

## 2023-02-17 LAB — PROTIME-INR
INR: 1.4 — ABNORMAL HIGH
Prothrombin Time: 15.1 s — ABNORMAL HIGH (ref 9.0–11.5)

## 2023-02-17 LAB — AFP TUMOR MARKER: AFP-Tumor Marker: 4.6 ng/mL

## 2023-02-17 LAB — VITAMIN D 25 HYDROXY (VIT D DEFICIENCY, FRACTURES): Vit D, 25-Hydroxy: 21 ng/mL — ABNORMAL LOW (ref 30–100)

## 2023-02-17 NOTE — Telephone Encounter (Signed)
I spoke with the patient she says she has been taking natures made Vit D 3 gel tabs 25 mcg, six of these per day in addition to the cholecalciferol 50,000 u weekly. She increased this yesterday to 10 tablets daily and wants to know if this is ok Wanted to know if she could take too much vit d. She says she will not be able to fill this until 02/19/2023. She uses [facility].

## 2023-02-17 NOTE — Telephone Encounter (Signed)
Has appointment 05/23/2023 Vit D level will be checked at that time.

## 2023-02-17 NOTE — Telephone Encounter (Signed)
Patient made aware :  If she is doing 10 of the 25 mcg Vitamin D over the counter, this is 10,000 units daily, in that case she can continue with the tablets x10 per day (10,000 units) and do the 50,000 units once weekly still. We will recheck in 3 months. If her levels do not come up, she may need to discuss vitamin d injection with PCP to get her levels up.

## 2023-02-17 NOTE — Telephone Encounter (Signed)
  [name], labs are stable. Vitamin D is still quite low. Please increase your vitamin D 50,000 units to twice weekly if you are only taking this once per week. We will recheck levels again at next visit. AFP tumor marker is not yet resulted, I will be in touch once I have this back. Let me know if you have any questions

## 2023-02-21 NOTE — Progress Notes (Signed)
02/21/23-EGD added to TCS. PA completed through [facility] Notification or Prior Authorization is not required for the requested services You are not required to submit a notification/prior authorization based on the information provided. If you have general questions about the prior authorization requirements, visit [email or web address] > Clinician Resources > Advance and Admission Notification Requirements. The number above acknowledges your notification. Please write this reference number down for future reference. If you would like to request an organization determination, please call us at [phone]. Decision ID #: [id number]

## 2023-02-23 ENCOUNTER — Other Ambulatory Visit (HOSPITAL_COMMUNITY): Payer: 59

## 2023-02-24 ENCOUNTER — Ambulatory Visit (HOSPITAL_COMMUNITY)
Admission: RE | Admit: 2023-02-24 | Discharge: 2023-02-24 | Disposition: A | Discharge status: Home / Self Care | Payer: 59 | Source: Ambulatory Visit | Attending: Gastroenterology | Admitting: Gastroenterology

## 2023-02-24 DIAGNOSIS — K7469 Other cirrhosis of liver: Secondary | ICD-10-CM | POA: Insufficient documentation

## 2023-02-28 ENCOUNTER — Other Ambulatory Visit (INDEPENDENT_AMBULATORY_CARE_PROVIDER_SITE_OTHER): Payer: Self-pay | Admitting: Gastroenterology

## 2023-02-28 ENCOUNTER — Encounter (INDEPENDENT_AMBULATORY_CARE_PROVIDER_SITE_OTHER): Payer: Self-pay | Admitting: *Deleted

## 2023-02-28 ENCOUNTER — Other Ambulatory Visit (INDEPENDENT_AMBULATORY_CARE_PROVIDER_SITE_OTHER): Payer: Self-pay | Admitting: *Deleted

## 2023-02-28 DIAGNOSIS — K7469 Other cirrhosis of liver: Secondary | ICD-10-CM

## 2023-02-28 MED ORDER — SPIRONOLACTONE 100 MG PO TABS: 100.0000 mg | ORAL_TABLET | Freq: Every day | ORAL | 3 refills | Status: DC

## 2023-02-28 MED ORDER — FUROSEMIDE 40 MG PO TABS: 40.0000 mg | ORAL_TABLET | Freq: Every day | ORAL | 3 refills | Status: DC

## 2023-02-28 NOTE — Telephone Encounter (Signed)
Pt called and notified per chelsea - 'm going to start her on some fluid medication to see if this helps given the US findings  We will start with lasix 40mg  and spironolactone 100mg  daily. We will need to repeat a CMP 1 week after she starts these to ensure her electrolytes are normal.  Pt verbalized understanding and aware to pick up lab order from office [facility]. Pt aware to do one week after starting fluid meds.

## 2023-03-04 ENCOUNTER — Other Ambulatory Visit: Payer: Self-pay

## 2023-03-04 ENCOUNTER — Encounter (HOSPITAL_COMMUNITY)
Admission: RE | Admit: 2023-03-04 | Discharge: 2023-03-04 | Disposition: A | Discharge status: Home / Self Care | Payer: 59 | Source: Ambulatory Visit | Attending: Gastroenterology | Admitting: Gastroenterology

## 2023-03-04 ENCOUNTER — Encounter (HOSPITAL_COMMUNITY): Payer: Self-pay

## 2023-03-04 HISTORY — DX: Other seasonal allergic rhinitis: J30.2

## 2023-03-08 ENCOUNTER — Ambulatory Visit (HOSPITAL_BASED_OUTPATIENT_CLINIC_OR_DEPARTMENT_OTHER): Payer: 59 | Admitting: Anesthesiology

## 2023-03-08 ENCOUNTER — Telehealth: Payer: Self-pay

## 2023-03-08 ENCOUNTER — Ambulatory Visit (HOSPITAL_COMMUNITY): Payer: 59 | Admitting: Anesthesiology

## 2023-03-08 ENCOUNTER — Other Ambulatory Visit (INDEPENDENT_AMBULATORY_CARE_PROVIDER_SITE_OTHER): Payer: Self-pay | Admitting: Gastroenterology

## 2023-03-08 ENCOUNTER — Ambulatory Visit (HOSPITAL_COMMUNITY)
Admission: RE | Admit: 2023-03-08 | Discharge: 2023-03-08 | Disposition: A | Discharge status: Home / Self Care | Payer: 59 | Attending: Gastroenterology | Admitting: Gastroenterology

## 2023-03-08 ENCOUNTER — Telehealth (INDEPENDENT_AMBULATORY_CARE_PROVIDER_SITE_OTHER): Payer: Self-pay

## 2023-03-08 ENCOUNTER — Encounter (HOSPITAL_COMMUNITY)
Admission: RE | Disposition: A | Discharge status: Home / Self Care | Payer: Self-pay | Source: Home / Self Care | Attending: Gastroenterology

## 2023-03-08 DIAGNOSIS — K219 Gastro-esophageal reflux disease without esophagitis: Secondary | ICD-10-CM | POA: Diagnosis not present

## 2023-03-08 DIAGNOSIS — K5730 Diverticulosis of large intestine without perforation or abscess without bleeding: Secondary | ICD-10-CM

## 2023-03-08 DIAGNOSIS — I8500 Esophageal varices without bleeding: Secondary | ICD-10-CM

## 2023-03-08 DIAGNOSIS — J449 Chronic obstructive pulmonary disease, unspecified: Secondary | ICD-10-CM | POA: Insufficient documentation

## 2023-03-08 DIAGNOSIS — K50012 Crohn's disease of small intestine with intestinal obstruction: Secondary | ICD-10-CM | POA: Diagnosis not present

## 2023-03-08 DIAGNOSIS — Z87891 Personal history of nicotine dependence: Secondary | ICD-10-CM | POA: Diagnosis not present

## 2023-03-08 DIAGNOSIS — E559 Vitamin D deficiency, unspecified: Secondary | ICD-10-CM | POA: Insufficient documentation

## 2023-03-08 DIAGNOSIS — M069 Rheumatoid arthritis, unspecified: Secondary | ICD-10-CM | POA: Insufficient documentation

## 2023-03-08 DIAGNOSIS — Z9889 Other specified postprocedural states: Secondary | ICD-10-CM | POA: Diagnosis not present

## 2023-03-08 DIAGNOSIS — K7460 Unspecified cirrhosis of liver: Secondary | ICD-10-CM | POA: Diagnosis present

## 2023-03-08 DIAGNOSIS — I10 Essential (primary) hypertension: Secondary | ICD-10-CM | POA: Insufficient documentation

## 2023-03-08 DIAGNOSIS — Z980 Intestinal bypass and anastomosis status: Secondary | ICD-10-CM | POA: Diagnosis not present

## 2023-03-08 DIAGNOSIS — K5090 Crohn's disease, unspecified, without complications: Secondary | ICD-10-CM | POA: Diagnosis not present

## 2023-03-08 DIAGNOSIS — K31A0 Gastric intestinal metaplasia, unspecified: Secondary | ICD-10-CM | POA: Diagnosis not present

## 2023-03-08 DIAGNOSIS — K5000 Crohn's disease of small intestine without complications: Secondary | ICD-10-CM

## 2023-03-08 DIAGNOSIS — Z9049 Acquired absence of other specified parts of digestive tract: Secondary | ICD-10-CM | POA: Diagnosis not present

## 2023-03-08 DIAGNOSIS — I8510 Secondary esophageal varices without bleeding: Secondary | ICD-10-CM | POA: Diagnosis not present

## 2023-03-08 DIAGNOSIS — Z79620 Long term (current) use of immunosuppressive biologic: Secondary | ICD-10-CM | POA: Diagnosis not present

## 2023-03-08 DIAGNOSIS — K56699 Other intestinal obstruction unspecified as to partial versus complete obstruction: Secondary | ICD-10-CM | POA: Diagnosis not present

## 2023-03-08 DIAGNOSIS — Z1211 Encounter for screening for malignant neoplasm of colon: Secondary | ICD-10-CM | POA: Diagnosis not present

## 2023-03-08 HISTORY — PX: ESOPHAGOGASTRODUODENOSCOPY (EGD) WITH PROPOFOL: SHX5813

## 2023-03-08 HISTORY — PX: COLONOSCOPY WITH PROPOFOL: SHX5780

## 2023-03-08 HISTORY — PX: BIOPSY: SHX5522

## 2023-03-08 LAB — HM COLONOSCOPY

## 2023-03-08 SURGERY — COLONOSCOPY WITH PROPOFOL
Anesthesia: General

## 2023-03-08 MED ORDER — PHENYLEPHRINE HCL (PRESSORS) 10 MG/ML IV SOLN
INTRAVENOUS | Status: DC | PRN
  Administered 2023-03-08 (×4): 100 ug via INTRAVENOUS

## 2023-03-08 MED ORDER — CARVEDILOL 3.125 MG PO TABS: 3.1250 mg | ORAL_TABLET | Freq: Two times a day (BID) | ORAL | 1 refills | Status: DC

## 2023-03-08 MED ORDER — PROPOFOL 10 MG/ML IV BOLUS
INTRAVENOUS | Status: DC | PRN
  Administered 2023-03-08: 40 mg via INTRAVENOUS
  Administered 2023-03-08: 100 mg via INTRAVENOUS
  Administered 2023-03-08: 40 mg via INTRAVENOUS
  Administered 2023-03-08: 60 mg via INTRAVENOUS

## 2023-03-08 MED ORDER — PROPOFOL 500 MG/50ML IV EMUL
INTRAVENOUS | Status: DC | PRN
  Administered 2023-03-08: 75 ug/kg/min via INTRAVENOUS

## 2023-03-08 MED ORDER — LACTATED RINGERS IV SOLN: INTRAVENOUS | Status: DC

## 2023-03-08 MED ORDER — LIDOCAINE HCL (CARDIAC) PF 100 MG/5ML IV SOSY
PREFILLED_SYRINGE | INTRAVENOUS | Status: DC | PRN
  Administered 2023-03-08: 50 mg via INTRAVENOUS

## 2023-03-08 NOTE — Telephone Encounter (Signed)
Please see approval for IV dated 03/08/2023.   Hello,  Patient will be scheduled as soon as possible.   Auth Submission: APPROVED Site of care: Site of care: [facility] Payer: [facility] medicare Medication & CPT/J Code(s) submitted: [name] [place]) [id number] Route of submission (phone, fax, portal): portal Phone # Fax # Auth type: Buy/Bill PB Units/visits requested: 600mg , q4weeks, 3 doses Reference number: [id number] Approval from: 03/08/2023 to 05/08/2023    Thank you

## 2023-03-08 NOTE — Telephone Encounter (Signed)
Hello, Patient will be scheduled as soon as possible.  Auth Submission: APPROVED Site of care: Site of care: [facility] Payer: [facility] medicare Medication & CPT/J Code(s) submitted: [name] [place]) [id number] Route of submission (phone, fax, portal): portal Phone # Fax # Auth type: Buy/Bill PB Units/visits requested: 600mg , q4weeks, 3 doses Reference number: [id number] Approval from: 03/08/2023 to 05/08/2023   Thank you

## 2023-03-08 NOTE — Discharge Instructions (Addendum)
You are being discharged to home.  Resume your previous diet.  Start carvedilol 3.125 mg BID. We are waiting for your pathology results.  Continue your present medications.  Will discuss with patient possibility of switching her to [name]/Stelara every 8 weeks.

## 2023-03-08 NOTE — Interval H&P Note (Signed)
History and Physical Interval Note:  03/08/2023 1:00 PM  [name]  has presented today for surgery, with the diagnosis of CROHNS, screening for esophageal varices.  The various methods of treatment have been discussed with the patient and family. After consideration of risks, benefits and other options for treatment, the patient has consented to  Procedure(s) with comments: COLONOSCOPY WITH PROPOFOL (N/A) - 1:45PM;ASA 3 ESOPHAGOGASTRODUODENOSCOPY (EGD) WITH PROPOFOL (N/A) as a surgical intervention.  The patient's history has been reviewed, patient examined, no change in status, stable for surgery.  I have reviewed the patient's chart and labs.  Questions were answered to the patient's satisfaction.     Katrinka Blazing [place]

## 2023-03-08 NOTE — Op Note (Signed)
[facility] Patient Name: [name] Procedure Date: 03/08/2023 1:24 PM MRN: [id number] Date of Birth: [birth year] Attending MD: Katrinka Blazing , , [id number] CSN: [id number] Age: 62 Admit Type: Outpatient Procedure:                Upper GI endoscopy Indications:              Cirrhosis rule out esophageal varices Providers:                Katrinka Blazing, Edrick Kins, RN, Francoise Ceo RN,                            RN, Dyann Ruddle Referring MD:              Medicines:                Monitored Anesthesia Care Complications:            No immediate complications. Estimated Blood Loss:     Estimated blood loss: none. Procedure:                Pre-Anesthesia Assessment:                           - Prior to the procedure, a History and Physical                            was performed, and patient medications, allergies                            and sensitivities were reviewed. The patient's                            tolerance of previous anesthesia was reviewed.                           - The risks and benefits of the procedure and the                            sedation options and risks were discussed with the                            patient. All questions were answered and informed                            consent was obtained.                           - ASA Grade Assessment: III - A patient with severe                            systemic disease.                           After obtaining informed consent, the endoscope was                            passed under direct vision.  Throughout the                            procedure, the patient's blood pressure, pulse, and                            oxygen saturations were monitored continuously. The                            [id number]-H190 ([id number]) scope was introduced through the                            mouth, and advanced to the second part of duodenum.                            The upper GI endoscopy was  accomplished without                            difficulty. The patient tolerated the procedure                            well. Scope In: 1:40:14 PM Scope Out: 1:42:59 PM Total Procedure Duration: 0 hours 2 minutes 45 seconds  Findings:      Grade I varices were found in the lower third of the esophagus.      The stomach was normal.      The examined duodenum was normal. Impression:               - Grade I esophageal varices.                           - Normal stomach.                           - Normal examined duodenum.                           - No specimens collected. Moderate Sedation:      Per Anesthesia Care Recommendation:           - Discharge patient to home (ambulatory).                           - Resume previous diet.                           - Start carvedilol 3.125 mg BID. Procedure Code(s):        --- Professional ---                           [phone], Esophagogastroduodenoscopy, flexible,                            transoral; diagnostic, including collection of                            specimen(s) by brushing or washing, when performed                            (  separate procedure) Diagnosis Code(s):        --- Professional ---                           K74.60, Unspecified cirrhosis of liver                           I85.10, Secondary esophageal varices without                            bleeding CPT copyright 2022 American Medical Association. All rights reserved. The codes documented in this report are preliminary and upon coder review may  be revised to meet current compliance requirements. Katrinka Blazing, MD Katrinka Blazing,  03/08/2023 1:45:57 PM This report has been signed electronically. Number of Addenda: 0

## 2023-03-08 NOTE — Transfer of Care (Signed)
Immediate Anesthesia Transfer of Care Note  Patient: LASHIEKA CABRIALES  Procedure(s) Performed: COLONOSCOPY WITH PROPOFOL ESOPHAGOGASTRODUODENOSCOPY (EGD) WITH PROPOFOL BIOPSY  Patient Location: Short Stay  Anesthesia Type:General  Level of Consciousness: awake and patient cooperative  Airway & Oxygen Therapy: Patient Spontanous Breathing  Post-op Assessment: Report given to RN and Post -op Vital signs reviewed and stable  Post vital signs: Reviewed and stable  Last Vitals:  Vitals Value Taken Time  BP 90/52 03/08/23  1409  Temp 97.9 03/08/23  1409  Pulse 75 03/08/23  1409  Resp 16 03/08/23  1409  SpO2 100 03/08/23  1409    Last Pain:  Vitals:   03/08/23 1333  TempSrc:   PainSc: 8       Patients Stated Pain Goal: (P) 8 (03/08/23 1257)  Complications: No notable events documented.

## 2023-03-08 NOTE — Anesthesia Procedure Notes (Signed)
Date/Time: 03/08/2023 1:32 PM  Performed by: Franco Nones, CRNAPre-anesthesia Checklist: Patient identified, Emergency Drugs available, Suction available, Timeout performed and Patient being monitored Patient Re-evaluated:Patient Re-evaluated prior to induction Oxygen Delivery Method: Nasal Cannula

## 2023-03-08 NOTE — Op Note (Signed)
[facility] Patient Name: [name] Procedure Date: 03/08/2023 1:23 PM MRN: [id number] Date of Birth: 10/08/1960 Attending MD: Katrinka Blazing , , [id number] CSN: [id number] Age: 62 Admit Type: Outpatient Procedure:                Colonoscopy Indications:              Assess therapeutic response to therapy of Crohn's                            disease of the small bowel Providers:                Katrinka Blazing, Francoise Ceo RN, RN, Edrick Kins,                            RN, Dyann Ruddle Referring MD:              Medicines:                Monitored Anesthesia Care Complications:            No immediate complications. Estimated Blood Loss:     Estimated blood loss: none. Procedure:                Pre-Anesthesia Assessment:                           - Prior to the procedure, a History and Physical                            was performed, and patient medications, allergies                            and sensitivities were reviewed. The patient's                            tolerance of previous anesthesia was reviewed.                           - Prior Anticoagulants: The patient has taken no                            anticoagulant or antiplatelet agents.                           After obtaining informed consent, the colonoscope                            was passed under direct vision. Throughout the                            procedure, the patient's blood pressure, pulse, and                            oxygen saturations were monitored continuously. The                            [id number]-HQ190L ([id number]) scope was introduced through  the anus and advanced to the 3 cm into the ileum.                            The colonoscopy was performed without difficulty.                            The patient tolerated the procedure well. The                            quality of the bowel preparation was good. Scope In: 1:48:23 PM Scope Out: 1:59:56 PM Scope  Withdrawal Time: 0 hours 9 minutes 45 seconds  Total Procedure Duration: 0 hours 11 minutes 33 seconds  Findings:      The perianal and digital rectal examinations were normal.      Inflammation characterized by congestion (edema) and erythema was found       in the terminal ileum. The inflammation was graded as Rutgeerts Score i2       (more than five aphthous lesions with normal intervening mucosa or skip       areas of larger lesions or lesions confined to the ileocolonic       anastomosis). Biopsies were taken with a cold forceps for histology.      The terminal ileum contained a benign-appearing, intrinsic moderate       stenosis measuring 2 cm (in length) x 6 mm (inner diameter) that was       non-traversed.      There was evidence of a prior end-to-side ileo-colonic anastomosis in       the ascending colon. This was patent and was characterized by friable       mucosa. There was presence of a mucosal bridge. The anastomosis was       traversed.      Scattered medium-mouthed and small-mouthed diverticula were found in the       sigmoid colon and descending colon.      There was evidence of a prior end-to-side colo-colonic anastomosis in       the recto-sigmoid colon. This was patent and was characterized by       healthy appearing mucosa.      The retroflexed view of the distal rectum and anal verge was normal and       showed no anal or rectal abnormalities. Impression:               - Crohn's disease with ileitis. Inflammation was                            found. This was graded as Rutgeerts Score i2 (more                            than five aphthous lesions with normal intervening                            mucosa or skip areas of larger lesions or lesions                            confined to the ileocolonic anastomosis). Biopsied.                           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                           - Patent end-to-side ileo-colonic anastomosis,                             characterized by friable mucosa.                           - Diverticulosis in the sigmoid colon and in the                            descending colon.                           - Patent end-to-side colo-colonic anastomosis,                            characterized by healthy appearing mucosa.                           - The distal rectum and anal verge are normal on                            retroflexion view. Moderate Sedation:      Per Anesthesia Care Recommendation:           - Discharge patient to home (ambulatory).                           - Resume previous diet.                           - Await pathology results.                           - Continue present medications.                           - Will discuss with patient possibility of                            switching her to [name]/Stelara every 8 weeks.                            Will stop Humira at that time. Procedure Code(s):        --- Professional ---                           [phone], Colonoscopy, flexible; with biopsy, single                            or multiple Diagnosis Code(s):        --- Professional ---                           K50.012, Crohn's disease of small intestine with  intestinal obstruction                           Z98.0, Intestinal bypass and anastomosis status                           K57.30, Diverticulosis of large intestine without                            perforation or abscess without bleeding CPT copyright 2022 American Medical Association. All rights reserved. The codes documented in this report are preliminary and upon coder review may  be revised to meet current compliance requirements. Katrinka Blazing, MD Katrinka Blazing,  03/08/2023 2:11:40 PM This report has been signed electronically. Number of Addenda: 0

## 2023-03-08 NOTE — Telephone Encounter (Signed)
I will start the authorization for maintenance once the patient has started on the IV infusions.

## 2023-03-08 NOTE — Telephone Encounter (Signed)
Dolores Frame, MD  Meredith Leeds, CMA; Raquel James, NP Hi [name], I'll place orders for [name] infusion, please start the authorization for every 8 week dosing Dx: Crohns disease of small bowel Thanks, Katrinka Blazing, MD Gastroenterology and Hepatology [facility]

## 2023-03-08 NOTE — Telephone Encounter (Signed)
Crystal, please start the authorization process for [name]. Also, please ask the patietn to take her last Humira dose 2 weeks prior to her first dose of [employer]

## 2023-03-08 NOTE — Telephone Encounter (Signed)
Thanks. Crystal, please start the authorization process for [name]. Also, please ask the patietn to take her last Humira dose 2 weeks prior to her first dose of [employer]

## 2023-03-08 NOTE — Anesthesia Preprocedure Evaluation (Signed)
Anesthesia Evaluation  Patient identified by MRN, date of birth, ID band Patient awake    Reviewed: Allergy & Precautions, H&P , NPO status , Patient's Chart, lab work & pertinent test results, reviewed documented beta blocker date and time   Airway Mallampati: II  TM Distance: >3 FB Neck ROM: full    Dental no notable dental hx.    Pulmonary neg pulmonary ROS, COPD, former smoker   Pulmonary exam normal breath sounds clear to auscultation       Cardiovascular Exercise Tolerance: Good hypertension, negative cardio ROS  Rhythm:regular Rate:Normal     Neuro/Psych  Neuromuscular disease negative neurological ROS  negative psych ROS   GI/Hepatic negative GI ROS, Neg liver ROS,GERD  ,,  Endo/Other  negative endocrine ROS    Renal/GU negative Renal ROS  negative genitourinary   Musculoskeletal   Abdominal   Peds  Hematology negative hematology ROS (+)   Anesthesia Other Findings   Reproductive/Obstetrics negative OB ROS                             Anesthesia Physical Anesthesia Plan  ASA: 3  Anesthesia Plan: General   Post-op Pain Management:    Induction:   PONV Risk Score and Plan: Propofol infusion  Airway Management Planned:   Additional Equipment:   Intra-op Plan:   Post-operative Plan:   Informed Consent: I have reviewed the patients History and Physical, chart, labs and discussed the procedure including the risks, benefits and alternatives for the proposed anesthesia with the patient or authorized representative who has indicated his/her understanding and acceptance.     Dental Advisory Given  Plan Discussed with: CRNA  Anesthesia Plan Comments:        Anesthesia Quick Evaluation

## 2023-03-09 ENCOUNTER — Encounter: Payer: Self-pay | Admitting: *Deleted

## 2023-03-09 ENCOUNTER — Telehealth: Payer: Self-pay | Admitting: Emergency Medicine

## 2023-03-09 LAB — SURGICAL PATHOLOGY

## 2023-03-09 NOTE — Telephone Encounter (Signed)
Spoke with Dr Levon Hedger for clarification of when pt is suppose to stop Humira and begin [name].  Pts last dose of Humira was 03/04/2023 and she does not need to take anymore of Humira.  She will come in for [facility] approximately 2 weeks after last dose of Humira.  Tried to call pt to schedule.  No answer.  Could not leave message.

## 2023-03-10 ENCOUNTER — Telehealth (INDEPENDENT_AMBULATORY_CARE_PROVIDER_SITE_OTHER): Payer: Self-pay | Admitting: *Deleted

## 2023-03-10 NOTE — Telephone Encounter (Signed)
I tried to call patient multiple times today.  Unfortunately, her voicemail was full and could not leave a message. She had evidence of ongoing inflammation in her small bowel -she has not responded to the use of Humira.  Her Humira levels have been adequate with the twice a week dosing and higher dose of this medication will not lead to improvement of her inflammation. I will encourage her to try Cristy Folks she has the safety profile is better than Humira or 6-MP (she has been on those medications in the past).  She could try Stelara as well, but at this point her insurance has approved the use of [name].  Toniann Fail, please try to call her tomorrow and discussed this again with her.  She can make a follow-up appointment to discuss this in detail.

## 2023-03-10 NOTE — Telephone Encounter (Signed)
Patient walked in office and had some concerns about skyrizi after looking up med. And would like to discuss side effects or see if she she can increase the humira. States she is feeling better since starting humira. Appetite is back.   [phone]

## 2023-03-10 NOTE — Anesthesia Postprocedure Evaluation (Signed)
Anesthesia Post Note  Patient: [name]  Procedure(s) Performed: COLONOSCOPY WITH PROPOFOL ESOPHAGOGASTRODUODENOSCOPY (EGD) WITH PROPOFOL BIOPSY  Patient location during evaluation: Phase II Anesthesia Type: General Level of consciousness: awake Pain management: pain level controlled Vital Signs Assessment: post-procedure vital signs reviewed and stable Respiratory status: spontaneous breathing and respiratory function stable Cardiovascular status: blood pressure returned to baseline and stable Postop Assessment: no headache and no apparent nausea or vomiting Anesthetic complications: no Comments: Late entry   No notable events documented.   Last Vitals:  Vitals:   03/08/23 1404 03/08/23 1410  BP: (!) 90/52 (!) 100/54  Pulse: 79 78  Resp: 16   Temp: 36.6 C   SpO2: 100%     Last Pain:  Vitals:   03/08/23 1404  TempSrc:   PainSc: 0-No pain                 [facility] [employer]

## 2023-03-11 ENCOUNTER — Telehealth (INDEPENDENT_AMBULATORY_CARE_PROVIDER_SITE_OTHER): Payer: Self-pay

## 2023-03-11 LAB — COMPREHENSIVE METABOLIC PANEL
AG Ratio: 0.9 (calc) — ABNORMAL LOW (ref 1.0–2.5)
ALT: 29 U/L (ref 6–29)
AST: 47 U/L — ABNORMAL HIGH (ref 10–35)
Albumin: 3.6 g/dL (ref 3.6–5.1)
Alkaline phosphatase (APISO): 217 U/L — ABNORMAL HIGH (ref 37–153)
BUN: 7 mg/dL (ref 7–25)
CO2: 32 mmol/L (ref 20–32)
Calcium: 8.9 mg/dL (ref 8.6–10.4)
Chloride: 100 mmol/L (ref 98–110)
Creat: 0.7 mg/dL (ref 0.50–1.05)
Globulin: 3.8 g/dL — ABNORMAL HIGH (ref 1.9–3.7)
Glucose, Bld: 69 mg/dL (ref 65–139)
Potassium: 3.3 mmol/L — ABNORMAL LOW (ref 3.5–5.3)
Sodium: 138 mmol/L (ref 135–146)
Total Bilirubin: 0.7 mg/dL (ref 0.2–1.2)
Total Protein: 7.4 g/dL (ref 6.1–8.1)

## 2023-03-11 NOTE — Telephone Encounter (Signed)
Patient aware she is scheduled for her first infusion on 03/22/2023 at [facility] and I would be reaching out to her closer to her On body infusion date the closer that gets. She is aware she will get an infusion at 0,4,8 weeks then on week 12 she will start the on body infuser. She will be using [facility] as her specialty pharmacy and I made her aware we would contact her closer to her Sq infusion date to go over the logistics. Patient states understanding.   First infusion 03/22/2023 = week 0 Second infusion around 04/19/2023 = week 4 Third infusion around 05/17/2023 = week 8 Start Maintenance on body on week 12 =06/14/2023 then Q 8 weeks there after. Nurse ambassador phone # [phone] opt 3, then 2 for help on how to administer.

## 2023-03-11 NOTE — Telephone Encounter (Signed)
[name] would you mind please sending these records over to the Dr. [name] below? Fax number is included. Thanks  Patient states she has an appointment with Dr. Scherrie Bateman on 03/14/2023 and would like for her TCS and EGD reports to be sent to her at [facility]. Fax number is [phone]

## 2023-03-11 NOTE — Telephone Encounter (Signed)
First Infusion 03/22/2023 at 10 am

## 2023-03-11 NOTE — Telephone Encounter (Signed)
I spoke with the patient made her aware:  She had evidence of ongoing inflammation in her small bowel -she has not responded to the use of Humira.  Her Humira levels have been adequate with the twice a week dosing and higher dose of this medication will not lead to improvement of her inflammation. I will encourage her to try Cristy Folks she has the safety profile is better than Humira or 6-MP (she has been on those medications in the past).  She could try Stelara as well, but at this point her insurance has approved the use of [name]. Patient states understanding.  Patient aware she is scheduled for her first infusion on 03/22/2023 at [facility] and I would be reaching out to her closer to her On body infusion date the closer that gets. She is aware she will get an infusion at 0,4,8 weeks then on week 12 she will start the on body infuser. She will be using [facility] as her specialty pharmacy and I made her aware we would contact her closer to her Sq infusion date to go over the logistics. Patient states understanding.   First infusion 03/22/2023 = week 0 Second infusion around 04/19/2023 = week 4 Third infusion around 05/17/2023 = week 8 Start Maintenance on body on week 12 =06/14/2023 then Q 8 weeks there after. Nurse ambassador phone # [phone] opt 3, then 2 for help on how to administer.

## 2023-03-11 NOTE — Telephone Encounter (Signed)
report faxed to Dr Lenn Cal

## 2023-03-14 ENCOUNTER — Encounter (HOSPITAL_COMMUNITY): Payer: Self-pay | Admitting: Gastroenterology

## 2023-03-15 ENCOUNTER — Encounter (INDEPENDENT_AMBULATORY_CARE_PROVIDER_SITE_OTHER): Payer: Self-pay | Admitting: *Deleted

## 2023-03-16 ENCOUNTER — Encounter (INDEPENDENT_AMBULATORY_CARE_PROVIDER_SITE_OTHER): Payer: Self-pay | Admitting: *Deleted

## 2023-03-16 ENCOUNTER — Other Ambulatory Visit: Payer: Self-pay | Admitting: Gastroenterology

## 2023-03-16 DIAGNOSIS — E876 Hypokalemia: Secondary | ICD-10-CM

## 2023-03-16 MED ORDER — POTASSIUM CHLORIDE CRYS ER 20 MEQ PO TBCR: 20.0000 meq | EXTENDED_RELEASE_TABLET | Freq: Every day | ORAL | 0 refills | Status: DC

## 2023-03-22 ENCOUNTER — Encounter: Payer: 59 | Attending: Gastroenterology | Admitting: *Deleted

## 2023-03-22 ENCOUNTER — Encounter (INDEPENDENT_AMBULATORY_CARE_PROVIDER_SITE_OTHER): Payer: Self-pay | Admitting: *Deleted

## 2023-03-22 VITALS — BP 107/58 | HR 71 | Temp 97.8°F | Resp 14

## 2023-03-22 DIAGNOSIS — K5000 Crohn's disease of small intestine without complications: Secondary | ICD-10-CM

## 2023-03-22 MED ORDER — SODIUM CHLORIDE 0.9 % IV SOLN
600.0000 mg | Freq: Once | INTRAVENOUS | Status: AC
  Administered 2023-03-22: 600 mg via INTRAVENOUS
  Filled 2023-03-22: qty 10

## 2023-03-22 NOTE — Progress Notes (Signed)
Diagnosis: Crohn's Disease  Provider:  Katrinka Blazing MD  Procedure: IV Infusion  IV Type: Peripheral, IV Location: L Forearm  [name] (risankizumab-rzaa), Dose: 600 mg  Infusion Start Time: 1022  Infusion Stop Time: 1135  Post Infusion IV Care: Observation period completed  Discharge: Condition: Good, Destination: Home . AVS Provided  Performed by:  Tonye Pearson, RN

## 2023-03-23 ENCOUNTER — Telehealth (INDEPENDENT_AMBULATORY_CARE_PROVIDER_SITE_OTHER): Payer: Self-pay | Admitting: *Deleted

## 2023-03-23 NOTE — Telephone Encounter (Signed)
-----   Message from Raquel James sent at 03/23/2023 10:39 AM EST ----- Would be better to have it sooner, we can refer to [facility] ----- Message ----- From: Simone Curia Sent: 03/23/2023   8:15 AM EST To: Raquel James, NP  Do you want me to refer her to [facility] or let him schedule 2-3 months out ----- Message ----- From: Lemar Lofty., MD Sent: 03/23/2023   6:15 AM EST To: Loretha Stapler, RN; Simone Curia  AT, Unfortunately, my schedule is not going to allow an EUS to be pursued for this patient for the next 2 to 3 months. This patient will need to be referred elsewhere if needed sooner. GM ----- Message ----- From: Loretha Stapler, RN Sent: 03/22/2023  12:00 PM EST To: Simone Curia; Lemar Lofty., MD   ----- Message ----- From: Simone Curia Sent: 03/22/2023  11:39 AM EST To: Loretha Stapler, RN  Patient needs EUS/ERCP Dx: Dilated bile duct

## 2023-03-23 NOTE — Telephone Encounter (Signed)
Spoke to patient and advised referral will be sent to [facility] since no availability at [facility] - she asked for a call from [facility] to explain what the procedure is for - please call  [phone]

## 2023-03-23 NOTE — Telephone Encounter (Signed)
On Body script faxed with records to [facility].  This message was sent via FAXCOM, a product from [employer]. [email or web address].biscom.com/                    -------Fax Transmission Report-------  To:               Recipient at [phone] Subject:          [facility] Result:           The transmission was successful. Explanation:      All Pages Ok Pages Sent:       80 Connect Time:     25 minutes, 22 seconds Transmit Time:    03/23/2023 12:25 Transfer Rate:    14400 Status Code:      0000 Retry Count:      0 Job Id:           [id number] Unique Id:        [id number] Fax Line:         55 Fax Server:       [id number]

## 2023-03-29 NOTE — Telephone Encounter (Signed)
I spoke with Tawanna Cooler at [facility] he says they did not get the previous fax, and asked that I re fax to [phone].  This message was sent via FAXCOM, a product from [employer]. [email or web address]/                    -------Fax Transmission Report-------  To:               Recipient at [phone] Subject:          Refax Result:           The transmission was successful. Explanation:      All Pages Ok Pages Sent:       65 Connect Time:     25 minutes, 10 seconds Transmit Time:    03/29/2023 08:55 Transfer Rate:    14400 Status Code:      0000 Retry Count:      0 Job Id:           [id number] Unique Id:        [id number] Fax Line:         54 Fax Server:       [id number]

## 2023-04-01 NOTE — Telephone Encounter (Signed)
I faxed information needed and got confirmation fax went through.

## 2023-04-01 NOTE — Telephone Encounter (Signed)
[facility] left vm asking for all tried and failed meds prior to skyrizi and also if patient had completed the induction dose of skyrizi. They want information faxed to them at fax #[phone]. I looked through med history and patient has tried and failed mercaptopruine 50mg  from 01/01/19 - 01/19/2022 and humira from 12/25/2021 and 03/07/2023. She received her first induction infusion of skyrizi on 03/22/23.

## 2023-04-08 ENCOUNTER — Encounter (INDEPENDENT_AMBULATORY_CARE_PROVIDER_SITE_OTHER): Payer: Self-pay | Admitting: Gastroenterology

## 2023-04-14 ENCOUNTER — Encounter (INDEPENDENT_AMBULATORY_CARE_PROVIDER_SITE_OTHER): Payer: Self-pay | Admitting: *Deleted

## 2023-04-14 NOTE — Telephone Encounter (Signed)
Fax from [facility]. Medication skyrizi. Estimated co pay $0. Approved through 10/10/23. [facility] also called and said they would be contacting patient to schedule shipment. Fax sent to be scanned to chart.

## 2023-04-14 NOTE — Telephone Encounter (Signed)
I spoke with the patient today and let her know that [facility] may be reaching out to her to setup the shipment of her medication. I advised that she needed to not take this right away as she is not due for the on body until after all of her infusions have been done and she will need to call the nurse help desk to get someone to come out around her 12 th week, which will be around the end of Feb 2025. The number was given to the patient and she states understanding of all.

## 2023-04-19 ENCOUNTER — Other Ambulatory Visit: Payer: Self-pay

## 2023-04-22 ENCOUNTER — Encounter: Payer: 59 | Attending: Gastroenterology | Admitting: Internal Medicine

## 2023-04-22 VITALS — BP 126/67 | HR 61 | Temp 98.2°F | Resp 16

## 2023-04-22 DIAGNOSIS — K50019 Crohn's disease of small intestine with unspecified complications: Secondary | ICD-10-CM | POA: Insufficient documentation

## 2023-04-22 MED ORDER — SODIUM CHLORIDE 0.9 % IV SOLN
600.0000 mg | Freq: Once | INTRAVENOUS | Status: AC
  Administered 2023-04-22: 600 mg via INTRAVENOUS
  Filled 2023-04-22: qty 10

## 2023-04-22 NOTE — Progress Notes (Signed)
 Diagnosis: Crohn's Disease  Provider:  Eartha Sieving MD  Procedure: IV Infusion  IV Type: Peripheral, IV Location: R Forearm  [name]  (risankizumab -rzaa), Dose: 600 mg  Infusion Start Time: 1013  Infusion Stop Time: 1127  Post Infusion IV Care: Observation period completed  Discharge: Condition: Good, Destination: Home . AVS Provided  Performed by:  Blanca Selinda SAUNDERS, LPN

## 2023-04-28 ENCOUNTER — Other Ambulatory Visit (HOSPITAL_COMMUNITY): Payer: Self-pay | Admitting: Internal Medicine

## 2023-04-28 DIAGNOSIS — R935 Abnormal findings on diagnostic imaging of other abdominal regions, including retroperitoneum: Secondary | ICD-10-CM

## 2023-04-28 DIAGNOSIS — K838 Other specified diseases of biliary tract: Secondary | ICD-10-CM

## 2023-05-09 ENCOUNTER — Ambulatory Visit (HOSPITAL_COMMUNITY): Payer: 59

## 2023-05-12 ENCOUNTER — Encounter (INDEPENDENT_AMBULATORY_CARE_PROVIDER_SITE_OTHER): Payer: Self-pay | Admitting: Gastroenterology

## 2023-05-17 NOTE — Telephone Encounter (Signed)
Week 0: Patient had first infusion on 03/22/2023  Week 4: Second infusion 04/22/2023  Week 8: Third infusion on 05/20/2023  Week 12: Will need to start maintenance on or around 06/14/2023.

## 2023-05-18 ENCOUNTER — Encounter (HOSPITAL_COMMUNITY): Payer: Self-pay | Admitting: Internal Medicine

## 2023-05-18 ENCOUNTER — Ambulatory Visit (HOSPITAL_COMMUNITY)
Admission: RE | Admit: 2023-05-18 | Discharge: 2023-05-18 | Disposition: A | Discharge status: Home / Self Care | Payer: 59 | Source: Ambulatory Visit | Attending: Internal Medicine | Admitting: Internal Medicine

## 2023-05-18 DIAGNOSIS — R935 Abnormal findings on diagnostic imaging of other abdominal regions, including retroperitoneum: Secondary | ICD-10-CM | POA: Insufficient documentation

## 2023-05-18 DIAGNOSIS — K838 Other specified diseases of biliary tract: Secondary | ICD-10-CM | POA: Insufficient documentation

## 2023-05-20 ENCOUNTER — Encounter: Payer: 59 | Admitting: Emergency Medicine

## 2023-05-20 VITALS — BP 124/79 | HR 67 | Temp 97.6°F | Resp 16

## 2023-05-20 DIAGNOSIS — K50019 Crohn's disease of small intestine with unspecified complications: Secondary | ICD-10-CM

## 2023-05-20 MED ORDER — GADOBUTROL 1 MMOL/ML IV SOLN
7.0000 mL | Freq: Once | INTRAVENOUS | Status: AC | PRN
  Administered 2023-05-20: 7 mL via INTRAVENOUS

## 2023-05-20 MED ORDER — SODIUM CHLORIDE 0.9 % IV SOLN
600.0000 mg | Freq: Once | INTRAVENOUS | Status: AC
  Administered 2023-05-20: 600 mg via INTRAVENOUS
  Filled 2023-05-20: qty 10

## 2023-05-20 NOTE — Progress Notes (Signed)
Diagnosis: Crohn's Disease  Provider:  Katrinka Blazing MD  Procedure: IV Infusion  IV Type: Peripheral, IV Location: R Forearm  [name] (risankizumab-rzaa), Dose: 600 mg  Infusion Start Time: 1044  Infusion Stop Time: 1147  Post Infusion IV Care: Peripheral IV Discontinued  Discharge: Condition: Good, Destination: Home . AVS Provided  Performed by:  Arrie Senate, RN

## 2023-05-20 NOTE — Telephone Encounter (Signed)
06/17/2023 will be 12 weeks, when she needs to do the first on Body, as at week 8 was the last IV infusion on 05/20/2023 at [facility].

## 2023-05-23 ENCOUNTER — Ambulatory Visit (INDEPENDENT_AMBULATORY_CARE_PROVIDER_SITE_OTHER): Payer: 59 | Admitting: Gastroenterology

## 2023-05-23 NOTE — Telephone Encounter (Signed)
I spoke with the patient and made her aware she would be due for the on body on 06/17/2023. She says she will call [employer] today to get this shipped to her home. I asked if she needed help with the administration of this, she says no, she was given a pamphlet at the infusion clinc on 05/20/2023, and thinks she can handle the administration of the on body herself. I asked that she call us with any issues. Patient states understanding.

## 2023-06-06 ENCOUNTER — Encounter (INDEPENDENT_AMBULATORY_CARE_PROVIDER_SITE_OTHER): Payer: 59 | Admitting: Gastroenterology

## 2023-06-06 ENCOUNTER — Ambulatory Visit: Payer: 59 | Admitting: Adult Health

## 2023-06-11 ENCOUNTER — Other Ambulatory Visit: Payer: Self-pay

## 2023-06-11 ENCOUNTER — Emergency Department (HOSPITAL_COMMUNITY): Payer: 59

## 2023-06-11 ENCOUNTER — Observation Stay (HOSPITAL_COMMUNITY)
Admission: EM | Admit: 2023-06-11 | Discharge: 2023-06-14 | Disposition: A | Discharge status: Home / Self Care | Payer: 59 | Attending: Family Medicine | Admitting: Family Medicine

## 2023-06-11 ENCOUNTER — Encounter (HOSPITAL_COMMUNITY): Payer: Self-pay | Admitting: Family Medicine

## 2023-06-11 DIAGNOSIS — D61818 Other pancytopenia: Principal | ICD-10-CM | POA: Diagnosis present

## 2023-06-11 DIAGNOSIS — K5000 Crohn's disease of small intestine without complications: Secondary | ICD-10-CM | POA: Diagnosis present

## 2023-06-11 DIAGNOSIS — R748 Abnormal levels of other serum enzymes: Secondary | ICD-10-CM | POA: Diagnosis present

## 2023-06-11 DIAGNOSIS — Z8719 Personal history of other diseases of the digestive system: Secondary | ICD-10-CM

## 2023-06-11 DIAGNOSIS — Z1152 Encounter for screening for COVID-19: Secondary | ICD-10-CM | POA: Insufficient documentation

## 2023-06-11 DIAGNOSIS — J449 Chronic obstructive pulmonary disease, unspecified: Secondary | ICD-10-CM | POA: Diagnosis not present

## 2023-06-11 DIAGNOSIS — Z79899 Other long term (current) drug therapy: Secondary | ICD-10-CM | POA: Insufficient documentation

## 2023-06-11 DIAGNOSIS — Z9049 Acquired absence of other specified parts of digestive tract: Secondary | ICD-10-CM | POA: Diagnosis not present

## 2023-06-11 DIAGNOSIS — D62 Acute posthemorrhagic anemia: Secondary | ICD-10-CM | POA: Insufficient documentation

## 2023-06-11 DIAGNOSIS — Z87891 Personal history of nicotine dependence: Secondary | ICD-10-CM | POA: Insufficient documentation

## 2023-06-11 DIAGNOSIS — M545 Low back pain, unspecified: Secondary | ICD-10-CM | POA: Diagnosis not present

## 2023-06-11 DIAGNOSIS — D649 Anemia, unspecified: Principal | ICD-10-CM

## 2023-06-11 DIAGNOSIS — G8929 Other chronic pain: Secondary | ICD-10-CM | POA: Insufficient documentation

## 2023-06-11 DIAGNOSIS — D500 Iron deficiency anemia secondary to blood loss (chronic): Secondary | ICD-10-CM | POA: Diagnosis present

## 2023-06-11 DIAGNOSIS — I8510 Secondary esophageal varices without bleeding: Secondary | ICD-10-CM | POA: Diagnosis present

## 2023-06-11 DIAGNOSIS — M069 Rheumatoid arthritis, unspecified: Secondary | ICD-10-CM | POA: Insufficient documentation

## 2023-06-11 LAB — CBC WITH DIFFERENTIAL/PLATELET
Abs Immature Granulocytes: 0 10*3/uL (ref 0.00–0.07)
Basophils Absolute: 0 10*3/uL (ref 0.0–0.1)
Basophils Relative: 0 %
Eosinophils Absolute: 0 10*3/uL (ref 0.0–0.5)
Eosinophils Relative: 0 %
HCT: 18.3 % — ABNORMAL LOW (ref 36.0–46.0)
Hemoglobin: 5.5 g/dL — CL (ref 12.0–15.0)
Lymphocytes Relative: 24 %
Lymphs Abs: 0.6 10*3/uL — ABNORMAL LOW (ref 0.7–4.0)
MCH: 23.3 pg — ABNORMAL LOW (ref 26.0–34.0)
MCHC: 30.1 g/dL (ref 30.0–36.0)
MCV: 77.5 fL — ABNORMAL LOW (ref 80.0–100.0)
Monocytes Absolute: 0 10*3/uL — ABNORMAL LOW (ref 0.1–1.0)
Monocytes Relative: 0 %
Neutro Abs: 1.9 10*3/uL (ref 1.7–7.7)
Neutrophils Relative %: 76 %
Platelets: 100 10*3/uL — ABNORMAL LOW (ref 150–400)
RBC: 2.36 MIL/uL — ABNORMAL LOW (ref 3.87–5.11)
RDW: 16.9 % — ABNORMAL HIGH (ref 11.5–15.5)
WBC: 2.5 10*3/uL — ABNORMAL LOW (ref 4.0–10.5)
nRBC: 0 % (ref 0.0–0.2)

## 2023-06-11 LAB — COMPREHENSIVE METABOLIC PANEL
ALT: 16 U/L (ref 0–44)
AST: 28 U/L (ref 15–41)
Albumin: 3.1 g/dL — ABNORMAL LOW (ref 3.5–5.0)
Alkaline Phosphatase: 134 U/L — ABNORMAL HIGH (ref 38–126)
Anion gap: 9 (ref 5–15)
BUN: 6 mg/dL — ABNORMAL LOW (ref 8–23)
CO2: 20 mmol/L — ABNORMAL LOW (ref 22–32)
Calcium: 8.4 mg/dL — ABNORMAL LOW (ref 8.9–10.3)
Chloride: 108 mmol/L (ref 98–111)
Creatinine, Ser: 0.61 mg/dL (ref 0.44–1.00)
GFR, Estimated: >60 mL/min (ref >60)
Glucose, Bld: 124 mg/dL — ABNORMAL HIGH (ref 70–99)
Potassium: 4.3 mmol/L (ref 3.5–5.1)
Sodium: 137 mmol/L (ref 135–145)
Total Bilirubin: 0.7 mg/dL (ref 0.0–1.2)
Total Protein: 6.5 g/dL (ref 6.5–8.1)

## 2023-06-11 LAB — RESP PANEL BY RT-PCR (RSV, FLU A&B, COVID)  RVPGX2
Influenza A by PCR: NEGATIVE
Influenza B by PCR: NEGATIVE
Resp Syncytial Virus by PCR: NEGATIVE
SARS Coronavirus 2 by RT PCR: NEGATIVE

## 2023-06-11 LAB — RETICULOCYTES
Immature Retic Fract: 34.2 % — ABNORMAL HIGH (ref 2.3–15.9)
RBC.: 2.35 MIL/uL — ABNORMAL LOW (ref 3.87–5.11)
Retic Count, Absolute: 43.2 10*3/uL (ref 19.0–186.0)
Retic Ct Pct: 1.8 % (ref 0.4–3.1)

## 2023-06-11 LAB — PREPARE RBC (CROSSMATCH)

## 2023-06-11 LAB — ABO/RH: ABO/RH(D): O POS

## 2023-06-11 LAB — FERRITIN: Ferritin: 7 ng/mL — ABNORMAL LOW (ref 11–307)

## 2023-06-11 LAB — IRON AND TIBC
Iron: 24 ug/dL — ABNORMAL LOW (ref 28–170)
Saturation Ratios: 6 % — ABNORMAL LOW (ref 10.4–31.8)
TIBC: 427 ug/dL (ref 250–450)
UIBC: 403 ug/dL

## 2023-06-11 LAB — FOLATE: Folate: 33.8 ng/mL (ref >5.9)

## 2023-06-11 LAB — VITAMIN B12: Vitamin B-12: 537 pg/mL (ref 180–914)

## 2023-06-11 LAB — POC OCCULT BLOOD, ED

## 2023-06-11 LAB — MRSA NEXT GEN BY PCR, NASAL: MRSA by PCR Next Gen: NOT DETECTED

## 2023-06-11 MED ORDER — SODIUM CHLORIDE 0.9% IV SOLUTION: Freq: Once | INTRAVENOUS | Status: DC

## 2023-06-11 MED ORDER — CHLORHEXIDINE GLUCONATE CLOTH 2 % EX PADS
6.0000 | MEDICATED_PAD | Freq: Every day | CUTANEOUS | Status: DC
  Administered 2023-06-11 – 2023-06-14 (×4): 6 via TOPICAL

## 2023-06-11 MED ORDER — METOPROLOL TARTRATE 5 MG/5ML IV SOLN: 5.0000 mg | Freq: Four times a day (QID) | INTRAVENOUS | Status: DC | PRN

## 2023-06-11 MED ORDER — SPIRONOLACTONE 100 MG PO TABS
100.0000 mg | ORAL_TABLET | Freq: Every day | ORAL | Status: DC
  Administered 2023-06-12 – 2023-06-14 (×3): 100 mg via ORAL
  Filled 2023-06-11 (×3): qty 1

## 2023-06-11 MED ORDER — POTASSIUM CHLORIDE CRYS ER 20 MEQ PO TBCR
20.0000 meq | EXTENDED_RELEASE_TABLET | Freq: Every day | ORAL | Status: DC
  Administered 2023-06-12 – 2023-06-14 (×3): 20 meq via ORAL
  Filled 2023-06-11 (×3): qty 1

## 2023-06-11 MED ORDER — PANTOPRAZOLE SODIUM 40 MG PO TBEC
40.0000 mg | DELAYED_RELEASE_TABLET | Freq: Every day | ORAL | Status: DC
  Administered 2023-06-12: 40 mg via ORAL
  Filled 2023-06-11: qty 1

## 2023-06-11 MED ORDER — CARVEDILOL 3.125 MG PO TABS
3.1250 mg | ORAL_TABLET | Freq: Two times a day (BID) | ORAL | Status: DC
  Administered 2023-06-12 – 2023-06-14 (×5): 3.125 mg via ORAL
  Filled 2023-06-11 (×5): qty 1

## 2023-06-11 MED ORDER — POLYETHYLENE GLYCOL 3350 17 G PO PACK: 17.0000 g | PACK | Freq: Every day | ORAL | Status: DC | PRN

## 2023-06-11 MED ORDER — FUROSEMIDE 40 MG PO TABS
40.0000 mg | ORAL_TABLET | Freq: Every day | ORAL | Status: DC
  Administered 2023-06-12 – 2023-06-14 (×3): 40 mg via ORAL
  Filled 2023-06-11 (×3): qty 1

## 2023-06-11 MED ORDER — AZITHROMYCIN 250 MG PO TABS
250.0000 mg | ORAL_TABLET | Freq: Every day | ORAL | Status: DC
  Administered 2023-06-12 – 2023-06-14 (×3): 250 mg via ORAL
  Filled 2023-06-11 (×3): qty 1

## 2023-06-11 MED ORDER — IBUPROFEN 400 MG PO TABS: 400.0000 mg | ORAL_TABLET | Freq: Four times a day (QID) | ORAL | Status: DC | PRN

## 2023-06-11 MED ORDER — ONDANSETRON HCL 4 MG PO TABS: 4.0000 mg | ORAL_TABLET | Freq: Three times a day (TID) | ORAL | Status: DC | PRN

## 2023-06-11 MED ORDER — FENTANYL CITRATE PF 50 MCG/ML IJ SOSY
12.5000 ug | PREFILLED_SYRINGE | INTRAMUSCULAR | Status: DC | PRN
  Administered 2023-06-11: 50 ug via INTRAVENOUS
  Filled 2023-06-11: qty 1

## 2023-06-11 MED ORDER — FAMOTIDINE 20 MG PO TABS
20.0000 mg | ORAL_TABLET | Freq: Every day | ORAL | Status: DC
Start: 2023-06-12 — End: 2023-06-12
  Administered 2023-06-12: 20 mg via ORAL
  Filled 2023-06-11: qty 1

## 2023-06-11 MED ORDER — TURMERIC 500 MG PO CAPS: ORAL_CAPSULE | Freq: Every day | ORAL | Status: DC

## 2023-06-11 MED ORDER — OXYCODONE HCL ER 20 MG PO T12A
20.0000 mg | EXTENDED_RELEASE_TABLET | Freq: Two times a day (BID) | ORAL | Status: DC
  Administered 2023-06-11 – 2023-06-14 (×6): 20 mg via ORAL
  Filled 2023-06-11 (×6): qty 1

## 2023-06-11 MED ORDER — ADULT MULTIVITAMIN W/MINERALS CH
1.0000 | ORAL_TABLET | Freq: Every day | ORAL | Status: DC
  Administered 2023-06-12 – 2023-06-14 (×3): 1 via ORAL
  Filled 2023-06-11 (×3): qty 1

## 2023-06-11 NOTE — Assessment & Plan Note (Signed)
 Noted on EGD.  Patient also has history of early chronic liver disease with cirrhosis possibly related to Imuran use to treat her Crohn's disease. This does not appear to be the acute reason for her drastic loss of red cells.

## 2023-06-11 NOTE — H&P (Signed)
 History and Physical    Patient: [name] VFI:[id number] DOB: 14-Jan-1961 DOA: 06/11/2023 DOS: the patient was seen and examined on 06/11/2023 PCP: Patient, No Pcp Per  Patient coming from: Home  Chief Complaint:  Chief Complaint  Patient presents with   abnormal labs   HPI: [name] is a 63 y.o. female with medical history significant of  some type of inflammatory arthritis for which she has chronic opioid use.  She has a history of chronic pancreatitis, Crohn's disease which is currently being treated with Cristy Folks which was changed from Humira.  Patient has a history of a colectomy without colostomy and had recent rectal bleeding about 3 to 4 weeks ago.  That has since stopped.  Her last GI evaluation with EGD and colonoscopy were in November 2024.  Patient also has a history of cirrhosis of the liver thought to be related to Imuran use for treatment of her Crohn's disease.  On EGD she did have grade 1 esophageal varices.  Her last colonoscopy had a few diverticula.  Patient notes her stools have returned to normal and she has no other bleeding.  She was guaiac negative in the ED.  The patient reports that her pain management doctor drew labs for her on yesterday and called her and told her to come in because her hemoglobin was approximately 6.  On eval in the ED today she has noted to have a hemoglobin of 5.5 WBC of 2.5 and a platelet count of 100.  Looking back appear she has low platelets for a while.  She also has a recent viral illness over the last 3 weeks.  She recently was put on a Z-Pak to help treat this.  Patient was noted to have an elevated alkaline phosphatase on labs today as well.  Patient denies having a primary care provider. Review of Systems: As mentioned in the history of present illness. All other systems reviewed and are negative. Past Medical History:  Diagnosis Date   Anorexia 10/01/2010   Arthritis    Chronic pain syndrome    bones and stomach   COPD (chronic  obstructive pulmonary disease) (HCC)    Crohn's 10/01/2010   Diarrhea 10/01/2010   Emesis 10/01/2010   GERD (gastroesophageal reflux disease)    Lower abdominal pain 10/01/2010   Pancreatitis    Rectal bleeding 10/01/2010   Seasonal allergies    Past Surgical History:  Procedure Laterality Date    Partial Hysterectomy  about 20 years ago   BIOPSY  06/23/2021   Procedure: BIOPSY;  Surgeon: Dolores Frame, MD;  [facility];  Service: Gastroenterology;;   BIOPSY  03/08/2023   Procedure: BIOPSY;  Surgeon: Dolores Frame, MD;  [facility];  Service: Gastroenterology;;   CHOLECYSTECTOMY     COLECTOMY  1995   x2   COLONOSCOPY  01/17/2006   October 2007 revealinga ileal ulcers proximal to anastomosis without stricture and a single rectal ulcer.   COLONOSCOPY WITH PROPOFOL N/A 04/02/2016   Procedure: COLONOSCOPY WITH PROPOFOL;  Surgeon: Malissa Hippo, MD;  [facility];  Service: Endoscopy;  Laterality: N/A;  1:00   COLONOSCOPY WITH PROPOFOL N/A 06/23/2021   Procedure: COLONOSCOPY WITH PROPOFOL;  Surgeon: Dolores Frame, MD;  [facility];  Service: Gastroenterology;  Laterality: N/A;  805 ASA 1   COLONOSCOPY WITH PROPOFOL N/A 03/08/2023   Procedure: COLONOSCOPY WITH PROPOFOL;  Surgeon: Dolores Frame, MD;  [facility];  Service: Gastroenterology;  Laterality: N/A;  1:45PM;ASA 3   ESOPHAGOGASTRODUODENOSCOPY (EGD) WITH PROPOFOL N/A 11/21/2013   Procedure: ESOPHAGOGASTRODUODENOSCOPY (EGD) WITH PROPOFOL;  Surgeon: Malissa Hippo, MD;  Location: [facility];  Service: Endoscopy;  Laterality: N/A;   ESOPHAGOGASTRODUODENOSCOPY (EGD) WITH PROPOFOL N/A 03/08/2023   Procedure: ESOPHAGOGASTRODUODENOSCOPY (EGD) WITH PROPOFOL;  Surgeon: Dolores Frame, MD;  [facility];  Service: Gastroenterology;  Laterality: N/A;   HERNIA REPAIR     TOOTH EXTRACTION Left 10/17/2013   left lower molar    Social History:  reports that she quit smoking about 7 years ago. Her smoking use included cigarettes. She started smoking about 42 years ago. She has a 8.8 pack-year smoking history. She has been exposed to tobacco smoke. She has never used smokeless tobacco. She reports that she does not drink alcohol and does not use drugs.  No Known Allergies  Family History  Problem Relation Age of Onset   Dementia Mother    Heart attack Mother        x 2   Heart disease Father    Heart attack Father    Diabetes Sister    Healthy Daughter    Healthy Son    Dementia Maternal Grandmother    Cancer Maternal Grandmother        breast    Prior to Admission medications   Medication Sig Start Date End Date Taking? Authorizing Provider  alendronate (FOSAMAX) 70 MG tablet Take 70 mg by mouth once a week. 05/06/23  Yes [provider]  azithromycin (ZITHROMAX) 250 MG tablet Take 250 mg by mouth daily. 06/10/23  Yes [provider]  carvedilol (COREG) 3.125 MG tablet Take 1 tablet (3.125 mg total) by mouth 2 (two) times daily with a meal. 03/08/23  Yes Marguerita Merles, Daniel, MD  famotidine (PEPCID) 20 MG tablet Take 20 mg by mouth daily.   Yes [provider]  furosemide (LASIX) 40 MG tablet Take 1 tablet (40 mg total) by mouth daily. 02/28/23 02/28/24 Yes Carlan, Chelsea L, NP  Multiple Vitamin (MULTIVITAMIN WITH MINERALS) TABS tablet Take 1 tablet by mouth daily.   Yes [provider]  ondansetron (ZOFRAN) 4 MG tablet Take 1 tablet (4 mg total) by mouth every 8 (eight) hours as needed for nausea or vomiting. 07/28/22  Yes Dolores Frame, MD  OXYCONTIN 20 MG 12 hr tablet Take 20 mg by mouth 2 (two) times daily.   Yes [provider]  pantoprazole (PROTONIX) 40 MG tablet Take 40 mg by mouth daily.   Yes [provider]  potassium chloride SA (KLOR-CON M) 20 MEQ tablet Take 1 tablet (20 mEq total) by mouth daily for 3 days. 03/16/23 06/11/23  Yes Letta Median, PA-C  predniSONE (STERAPRED UNI-PAK 21 TAB) 5 MG (21) TBPK tablet Take 5 mg by mouth as directed. 06/10/23  Yes [provider]  [name] 360 MG/2.4ML SOCT by Intravenous (Continuous Infusion) route every 3 (three) months. 05/11/23  Yes [provider]  spironolactone (ALDACTONE) 100 MG tablet Take 1 tablet (100 mg total) by mouth daily. 02/28/23 02/28/24 Yes Carlan, Chelsea L, NP  Turmeric (QC TUMERIC COMPLEX PO) Take by mouth daily at 6 (six) AM.   Yes [provider]  Vitamin D, Ergocalciferol, (DRISDOL) 1.25 MG (50000 UNIT) CAPS capsule Take 50,000 Units by mouth once a week. 06/10/23  Yes [provider]    Physical Exam: Vitals:   06/11/23 1700 06/11/23 1709 06/11/23 1715 06/11/23 1723  BP:   Marland Kitchen)  144/92 (!) 144/92  Pulse: 81 74 91 80  Resp:    16  Temp:    98.2 F (36.8 C)  TempSrc:    Oral  SpO2: 91% 95% 98% 95%  Weight:      Height:       Physical Examination: General appearance -alert, cooperative, pale Neck - supple, no significant adenopathy Chest - clear to auscultation, no wheezes, rales or rhonchi, symmetric air entry Heart - normal rate, regular rhythm, normal S1, S2, no murmurs, rubs, clicks or gallops Abdomen - soft, nontender, nondistended, no masses or organomegaly Extremities - pedal edema 1+  Data Reviewed: Results for orders placed or performed during the hospital encounter of 06/11/23 (from the past 24 hours)  Resp panel by RT-PCR (RSV, Flu A&B, Covid) Anterior Nasal Swab     Status: None   Collection Time: 06/11/23  2:13 PM   Specimen: Anterior Nasal Swab  Result Value Ref Range   SARS Coronavirus 2 by RT PCR NEGATIVE NEGATIVE   Influenza A by PCR NEGATIVE NEGATIVE   Influenza B by PCR NEGATIVE NEGATIVE   Resp Syncytial Virus by PCR NEGATIVE NEGATIVE  POC occult blood, ED     Status: None   Collection Time: 06/11/23  2:23 PM  Result Value Ref Range   Negative    Type and screen [facility]      Status: None (Preliminary result)   Collection Time: 06/11/23  2:42 PM  Result Value Ref Range   ABO/RH(D) O POS    Antibody Screen NEG    Sample Expiration 06/14/2023,2359    Unit Number [id number]    Blood Component Type RED CELLS,LR    Unit division 00    Status of Unit ALLOCATED    Transfusion Status OK TO TRANSFUSE    Crossmatch Result Compatible    Unit Number [id number]    Blood Component Type RED CELLS,LR    Unit division 00    Status of Unit ISSUED    Transfusion Status OK TO TRANSFUSE    Crossmatch Result      Compatible Performed at [facility], [address] [place], [place] [address]   Vitamin B12     Status: None   Collection Time: 06/11/23  2:42 PM  Result Value Ref Range   Vitamin B-12 537 180 - 914 pg/mL  Folate     Status: None   Collection Time: 06/11/23  2:42 PM  Result Value Ref Range   Folate 33.8 >5.9 ng/mL  Iron and TIBC     Status: Abnormal   Collection Time: 06/11/23  2:42 PM  Result Value Ref Range   Iron 24 (L) 28 - 170 ug/dL   TIBC 784 696 - 295 ug/dL   Saturation Ratios 6 (L) 10.4 - 31.8 %   UIBC 403 ug/dL  Ferritin     Status: Abnormal   Collection Time: 06/11/23  2:42 PM  Result Value Ref Range   Ferritin 7 (L) 11 - 307 ng/mL  Reticulocytes     Status: Abnormal   Collection Time: 06/11/23  2:42 PM  Result Value Ref Range   Retic Ct Pct 1.8 0.4 - 3.1 %   RBC. 2.35 (L) 3.87 - 5.11 MIL/uL   Retic Count, Absolute 43.2 19.0 - 186.0 K/uL   Immature Retic Fract 34.2 (H) 2.3 - 15.9 %  CBC with Differential     Status: Abnormal   Collection Time: 06/11/23  2:59 PM  Result Value Ref Range  WBC 2.5 (L) 4.0 - 10.5 K/uL   RBC 2.36 (L) 3.87 - 5.11 MIL/uL   Hemoglobin 5.5 (LL) 12.0 - 15.0 g/dL   HCT 16.1 (L) 09.6 - 04.5 %   MCV 77.5 (L) 80.0 - 100.0 fL   MCH 23.3 (L) 26.0 - 34.0 pg   MCHC 30.1 30.0 - 36.0 g/dL   RDW 40.9 (H) 81.1 - 91.4 %   Platelets 100 (L) 150 - 400 K/uL   nRBC 0.0 0.0 - 0.2 %   Neutrophils Relative % 76 %    Neutro Abs 1.9 1.7 - 7.7 K/uL   Lymphocytes Relative 24 %   Lymphs Abs 0.6 (L) 0.7 - 4.0 K/uL   Monocytes Relative 0 %   Monocytes Absolute 0.0 (L) 0.1 - 1.0 K/uL   Eosinophils Relative 0 %   Eosinophils Absolute 0.0 0.0 - 0.5 K/uL   Basophils Relative 0 %   Basophils Absolute 0.0 0.0 - 0.1 K/uL   WBC Morphology MORPHOLOGY UNREMARKABLE    Smear Review PLATELET COUNT CONFIRMED BY SMEAR    Abs Immature Granulocytes 0.00 0.00 - 0.07 K/uL   Polychromasia PRESENT    Ovalocytes PRESENT   Comprehensive metabolic panel     Status: Abnormal   Collection Time: 06/11/23  2:59 PM  Result Value Ref Range   Sodium 137 135 - 145 mmol/L   Potassium 4.3 3.5 - 5.1 mmol/L   Chloride 108 98 - 111 mmol/L   CO2 20 (L) 22 - 32 mmol/L   Glucose, Bld 124 (H) 70 - 99 mg/dL   BUN 6 (L) 8 - 23 mg/dL   Creatinine, Ser 7.82 0.44 - 1.00 mg/dL   Calcium 8.4 (L) 8.9 - 10.3 mg/dL   Total Protein 6.5 6.5 - 8.1 g/dL   Albumin 3.1 (L) 3.5 - 5.0 g/dL   AST 28 15 - 41 U/L   ALT 16 0 - 44 U/L   Alkaline Phosphatase 134 (H) 38 - 126 U/L   Total Bilirubin 0.7 0.0 - 1.2 mg/dL   GFR, Estimated >95 >62 mL/min   Anion gap 9 5 - 15  ABO/Rh     Status: None   Collection Time: 06/11/23  5:21 PM  Result Value Ref Range   ABO/RH(D)      O POS Performed at [facility], [address], [place], [place] [address]   Prepare RBC (crossmatch)     Status: None   Collection Time: 06/11/23  5:21 PM  Result Value Ref Range   Order Confirmation      ORDER PROCESSED BY BLOOD BANK Performed at [facility], [address], [place], [place] [address]    DG Chest Portable 1 View Result Date: 06/11/2023 CLINICAL DATA:  Cough EXAM: PORTABLE CHEST 1 VIEW COMPARISON:  Chest radiograph dated 08/12/2007 FINDINGS: Normal lung volumes. Hazy lateral right upper lung opacity. No pleural effusion or pneumothorax. The heart size and mediastinal contours are within normal limits. No acute osseous abnormality. IMPRESSION: Hazy lateral right upper  lung opacity, which may reflect infection in the appropriate clinical setting. Electronically Signed   By: Agustin Cree M.D.   On: 06/11/2023 15:03   MR ABDOMEN WWO CONTRAST Result Date: 05/29/2023 CLINICAL DATA:  Common bile duct dilatation, cirrhosis EXAM: MRI ABDOMEN WITHOUT AND WITH CONTRAST TECHNIQUE: Multiplanar multisequence MR imaging of the abdomen was performed both before and after the administration of intravenous contrast. CONTRAST:  7mL GADAVIST GADOBUTROL 1 MMOL/ML IV SOLN COMPARISON:  Right upper quadrant ultrasound, 02/24/2023, MR  abdomen, 11/30/2021 FINDINGS: Lower chest: No acute abnormality. Hepatobiliary: No focal liver abnormality is seen. Status post cholecystectomy. Unchanged intra and extrahepatic biliary ductal dilatation, common hepatic duct measuring up to 1.4 cm in caliber (series 4, image 17). Pancreas: Diffusely atrophic pancreas. No pancreatic ductal dilatation or surrounding inflammatory changes. Spleen: Normal in size without significant abnormality. Adrenals/Urinary Tract: Adrenal glands are unremarkable. Kidneys are normal, without renal calculi, solid lesion, or hydronephrosis. Stomach/Bowel: Stomach is within normal limits. No evidence of bowel wall thickening, distention, or inflammatory changes. Vascular/Lymphatic: Aortic atherosclerosis. No enlarged abdominal lymph nodes. Other: No abdominal wall hernia or abnormality. No ascites. Musculoskeletal: No acute or significant osseous findings. IMPRESSION: 1. Status post cholecystectomy. Unchanged intra and extrahepatic biliary ductal dilatation, common hepatic duct measuring up to 1.4 cm in caliber. No obstructing mass or stone. Findings are most consistent with benign post cholecystectomy ductal dilatation 2. No focal liver abnormality. No morphologic changes of cirrhosis or suspicious liver lesion. 3. Diffuse pancreatic atrophy, in keeping with known history of chronic pancreatitis. No acute inflammatory findings. Aortic  Atherosclerosis (ICD10-I70.0). Electronically Signed   By: Jearld Lesch M.D.   On: 05/29/2023 14:25    Assessment and Plan: * Pancytopenia (HCC) Patient is low and all of her cell lines.  She is iron deficient but also has a low white count and a low platelet count. Will have heme-onc see her for this. Her most pressing matter is her severe anemia with hemoglobin of 5.5.  For this she will be transfused 2 units of packed red blood cells with a repeat hemoglobin in the morning.  Alkaline phosphatase elevation Unclear etiology likely related to her chronic liver disease, though all of her other liver markers appear normal including T. bili. Will check GGT and 5 nucleotidase  Crohn's disease involving terminal ileum (HCC) On [name] Does not appear to be having an acute flare at this moment.  Secondary esophageal varices without bleeding (HCC) Noted on EGD.  Patient also has history of early chronic liver disease with cirrhosis possibly related to Imuran use to treat her Crohn's disease. This does not appear to be the acute reason for her drastic loss of red cells.  History of rectal bleeding Likely related to Crohn's disease. She is on [name].  Last time she had any rectal bleeding was over 3 weeks ago.  She reports stools right now are solid and brown. Patient is status post colonoscopy and EGD in November 2024 which showed few diverticula and few esophageal varices. Patient is guaiac negative today. Consider GI consultation if recurrent bleeding seems to be going on.  LOW BACK PAIN Continue home pain meds      Advance Care Planning:   Code Status: Not on file Full, confirmed with patient  Consults: Heme-onc  Family Communication: Family at bedside  Severity of Illness: The appropriate patient status for this patient is INPATIENT. Inpatient status is judged to be reasonable and necessary in order to provide the required intensity of service to ensure the patient's safety.  The patient's presenting symptoms, physical exam findings, and initial radiographic and laboratory data in the context of their chronic comorbidities is felt to place them at high risk for further clinical deterioration. Furthermore, it is not anticipated that the patient will be medically stable for discharge from the hospital within 2 midnights of admission.   * I certify that at the point of admission it is my clinical judgment that the patient will require inpatient hospital care spanning beyond 2 midnights  from the point of admission due to high intensity of service, high risk for further deterioration and high frequency of surveillance required.*  Author: Reva Bores, MD 06/11/2023 6:02 PM  For on call review [email or web address].

## 2023-06-11 NOTE — Hospital Course (Signed)
 Patient is a 63 year old with some type of inflammatory arthritis for which she has chronic opioid use.  She has a history of chronic pancreatitis, Crohn's disease which is currently being treated with Cristy Folks which was changed from Humira.  Patient has a history of a colectomy without colostomy and had recent rectal bleeding about 3 to 4 weeks ago.  That has since stopped.  Her last GI evaluation with EGD and colonoscopy were in November 2024.  Patient also has a history of cirrhosis of the liver thought to be related to Imuran use for treatment of her Crohn's disease.  On EGD she did have grade 1 esophageal varices.  Her last colonoscopy had a few diverticula.  Patient notes her stools have returned to normal and she has no other bleeding.  She was guaiac negative in the ED.  The patient reports that her pain management doctor drew labs for her on yesterday and called her and told her to come in because her hemoglobin was approximately 6.  On eval in the ED today she has noted to have a hemoglobin of 5.5 WBC of 2.5 and a platelet count of 100.  Looking back appear she has low platelets for a while.  She also has a recent viral illness over the last 3 weeks.  She recently was put on a Z-Pak to help treat this.  Patient was noted to have an elevated alkaline phosphatase on labs today as well.  Patient denies having a primary care provider.

## 2023-06-11 NOTE — ED Provider Notes (Cosign Needed Addendum)
 [place] EMERGENCY DEPARTMENT AT [facility] Provider Note   CSN: [id number] Arrival date & time: 06/11/23  1254     History  No chief complaint on file.   [name] is a 63 y.o. female.  Patient with history of Crohn's, GERD, chronic pain, arthritis, COPD, pancreatitis presents on the advice of her Pain Mgmt doctor. She was seen yesterday and told today that her hemoglobin was very low. She reports her blood is tested every month and this is the first time hemoglobin has been this low. She denies melena or rectal bleeding. No hematemesis. She is having symptoms of fatigue, dizziness without syncope this past one week. She also reports a cough for the past 3 weeks. No fever.   The history is provided by the patient. No language interpreter was used.       Home Medications Prior to Admission medications   Medication Sig Start Date End Date Taking? Authorizing Provider  alendronate (FOSAMAX) 70 MG tablet Take 70 mg by mouth once a week. 05/06/23  Yes [provider]  Vitamin D, Ergocalciferol, (DRISDOL) 1.25 MG (50000 UNIT) CAPS capsule Take 50,000 Units by mouth once a week. 06/10/23  Yes [provider]  adalimumab (HUMIRA, 2 PEN,) 40 MG/0.4ML pen INJECT 40 MG INTO THE SKIN EVERY OTHER WEEK. 02/07/23   Carlan, Chelsea L, NP  azithromycin (ZITHROMAX) 250 MG tablet Take 250 mg by mouth daily. 06/10/23   [provider]  carvedilol (COREG) 3.125 MG tablet Take 1 tablet (3.125 mg total) by mouth 2 (two) times daily with a meal. 03/08/23   Marguerita Merles, Reuel Boom, MD  famotidine (PEPCID) 20 MG tablet Take 20 mg by mouth daily.    [provider]  furosemide (LASIX) 40 MG tablet Take 1 tablet (40 mg total) by mouth daily. 02/28/23 02/28/24  Raquel James, NP  Multiple Vitamin (MULTIVITAMIN WITH MINERALS) TABS tablet Take 1 tablet by mouth daily.    [provider]  ondansetron (ZOFRAN) 4 MG tablet Take 1 tablet (4 mg total) by  mouth every 8 (eight) hours as needed for nausea or vomiting. 07/28/22   Dolores Frame, MD  OXYCONTIN 20 MG 12 hr tablet Take 20 mg by mouth 2 (two) times daily.    [provider]  pantoprazole (PROTONIX) 40 MG tablet Take 40 mg by mouth daily.    [provider]  potassium chloride SA (KLOR-CON M) 20 MEQ tablet Take 1 tablet (20 mEq total) by mouth daily for 3 days. 03/16/23 03/19/23  Letta Median, PA-C  predniSONE (STERAPRED UNI-PAK 21 TAB) 5 MG (21) TBPK tablet Take 5 mg by mouth as directed. 06/10/23   [provider]  [name] 360 MG/2.4ML SOCT  05/11/23   [provider]  spironolactone (ALDACTONE) 100 MG tablet Take 1 tablet (100 mg total) by mouth daily. 02/28/23 02/28/24  Carlan, Jeral Pinch, NP  traZODone (DESYREL) 50 MG tablet Take 50 mg by mouth at bedtime. 12/14/22   [provider]  Turmeric (QC TUMERIC COMPLEX PO) Take by mouth daily at 6 (six) AM.    [provider]  VITAMIN D, CHOLECALCIFEROL, PO Take by mouth. Vit d 50,000 units once a week    [provider]      Allergies    Patient has no known allergies.    Review of Systems   Review of Systems  Physical Exam Updated Vital Signs BP 125/72 (BP Location: Right Arm)   Pulse 69  Temp 98.2 F (36.8 C) (Oral)   Resp 14   Ht 5\' 2"  (1.575 m)   Wt 48.5 kg   SpO2 100%   BMI 19.57 kg/m  Physical Exam Vitals and nursing note reviewed.  Constitutional:      Appearance: She is well-developed.  HENT:     Head: Normocephalic.  Eyes:     General: No scleral icterus.    Comments: Conjunctival pallor.   Neck:     Vascular: No carotid bruit.  Cardiovascular:     Rate and Rhythm: Normal rate and regular rhythm.     Heart sounds: No murmur heard. Pulmonary:     Effort: Pulmonary effort is normal.     Breath sounds: Normal breath sounds. No wheezing, rhonchi or rales.  Abdominal:     General: Bowel sounds are normal.     Palpations: Abdomen is  soft.     Tenderness: There is no abdominal tenderness. There is no guarding or rebound.  Musculoskeletal:        General: Normal range of motion.     Cervical back: Normal range of motion and neck supple.  Skin:    General: Skin is warm and dry.     Capillary Refill: Capillary refill takes 2 to 3 seconds.  Neurological:     General: No focal deficit present.     Mental Status: She is alert and oriented to person, place, and time.     ED Results / Procedures / Treatments   Labs (all labs ordered are listed, but only abnormal results are displayed) Labs Reviewed  CBC WITH DIFFERENTIAL/PLATELET - Abnormal; Notable for the following components:      Result Value   WBC 2.5 (*)    RBC 2.36 (*)    Hemoglobin 5.5 (*)    HCT 18.3 (*)    MCV 77.5 (*)    MCH 23.3 (*)    RDW 16.9 (*)    Platelets 100 (*)    Lymphs Abs 0.6 (*)    Monocytes Absolute 0.0 (*)    All other components within normal limits  COMPREHENSIVE METABOLIC PANEL - Abnormal; Notable for the following components:   CO2 20 (*)    Glucose, Bld 124 (*)    BUN 6 (*)    Calcium 8.4 (*)    Albumin 3.1 (*)    Alkaline Phosphatase 134 (*)    All other components within normal limits  IRON AND TIBC - Abnormal; Notable for the following components:   Iron 24 (*)    Saturation Ratios 6 (*)    All other components within normal limits  FERRITIN - Abnormal; Notable for the following components:   Ferritin 7 (*)    All other components within normal limits  RETICULOCYTES - Abnormal; Notable for the following components:   RBC. 2.35 (*)    Immature Retic Fract 34.2 (*)    All other components within normal limits  RESP PANEL BY RT-PCR (RSV, FLU A&B, COVID)  RVPGX2  VITAMIN B12  FOLATE  POC OCCULT BLOOD, ED  TYPE AND SCREEN  ABO/RH  PREPARE RBC (CROSSMATCH)   Results for orders placed or performed during the hospital encounter of 06/11/23  Resp panel by RT-PCR (RSV, Flu A&B, Covid) Anterior Nasal Swab   Collection  Time: 06/11/23  2:13 PM   Specimen: Anterior Nasal Swab  Result Value Ref Range   SARS Coronavirus 2 by RT PCR NEGATIVE NEGATIVE   Influenza A by PCR NEGATIVE NEGATIVE  Influenza B by PCR NEGATIVE NEGATIVE   Resp Syncytial Virus by PCR NEGATIVE NEGATIVE  POC occult blood, ED   Collection Time: 06/11/23  2:23 PM  Result Value Ref Range   Negative    Vitamin B12   Collection Time: 06/11/23  2:42 PM  Result Value Ref Range   Vitamin B-12 537 180 - 914 pg/mL  Folate   Collection Time: 06/11/23  2:42 PM  Result Value Ref Range   Folate 33.8 >5.9 ng/mL  Iron and TIBC   Collection Time: 06/11/23  2:42 PM  Result Value Ref Range   Iron 24 (L) 28 - 170 ug/dL   TIBC 782 956 - 213 ug/dL   Saturation Ratios 6 (L) 10.4 - 31.8 %   UIBC 403 ug/dL  Ferritin   Collection Time: 06/11/23  2:42 PM  Result Value Ref Range   Ferritin 7 (L) 11 - 307 ng/mL  Reticulocytes   Collection Time: 06/11/23  2:42 PM  Result Value Ref Range   Retic Ct Pct 1.8 0.4 - 3.1 %   RBC. 2.35 (L) 3.87 - 5.11 MIL/uL   Retic Count, Absolute 43.2 19.0 - 186.0 K/uL   Immature Retic Fract 34.2 (H) 2.3 - 15.9 %  Type and screen [facility]   Collection Time: 06/11/23  2:42 PM  Result Value Ref Range   ABO/RH(D) O POS    Antibody Screen NEG    Sample Expiration      06/14/2023,2359 Performed at [facility], [address], [place], [place] [address]   CBC with Differential   Collection Time: 06/11/23  2:59 PM  Result Value Ref Range   WBC 2.5 (L) 4.0 - 10.5 K/uL   RBC 2.36 (L) 3.87 - 5.11 MIL/uL   Hemoglobin 5.5 (LL) 12.0 - 15.0 g/dL   HCT 84.6 (L) 96.2 - 95.2 %   MCV 77.5 (L) 80.0 - 100.0 fL   MCH 23.3 (L) 26.0 - 34.0 pg   MCHC 30.1 30.0 - 36.0 g/dL   RDW 84.1 (H) 32.4 - 40.1 %   Platelets 100 (L) 150 - 400 K/uL   nRBC 0.0 0.0 - 0.2 %   Neutrophils Relative % 76 %   Neutro Abs 1.9 1.7 - 7.7 K/uL   Lymphocytes Relative 24 %   Lymphs Abs 0.6 (L) 0.7 - 4.0 K/uL   Monocytes Relative 0 %    Monocytes Absolute 0.0 (L) 0.1 - 1.0 K/uL   Eosinophils Relative 0 %   Eosinophils Absolute 0.0 0.0 - 0.5 K/uL   Basophils Relative 0 %   Basophils Absolute 0.0 0.0 - 0.1 K/uL   WBC Morphology MORPHOLOGY UNREMARKABLE    Smear Review PLATELET COUNT CONFIRMED BY SMEAR    Abs Immature Granulocytes 0.00 0.00 - 0.07 K/uL   Polychromasia PRESENT    Ovalocytes PRESENT   Comprehensive metabolic panel   Collection Time: 06/11/23  2:59 PM  Result Value Ref Range   Sodium 137 135 - 145 mmol/L   Potassium 4.3 3.5 - 5.1 mmol/L   Chloride 108 98 - 111 mmol/L   CO2 20 (L) 22 - 32 mmol/L   Glucose, Bld 124 (H) 70 - 99 mg/dL   BUN 6 (L) 8 - 23 mg/dL   Creatinine, Ser 0.27 0.44 - 1.00 mg/dL   Calcium 8.4 (L) 8.9 - 10.3 mg/dL   Total Protein 6.5 6.5 - 8.1 g/dL   Albumin 3.1 (L) 3.5 - 5.0 g/dL   AST 28 15 - 41 U/L  ALT 16 0 - 44 U/L   Alkaline Phosphatase 134 (H) 38 - 126 U/L   Total Bilirubin 0.7 0.0 - 1.2 mg/dL   GFR, Estimated >16 >10 mL/min   Anion gap 9 5 - 15    EKG None  Radiology DG Chest Portable 1 View Result Date: 06/11/2023 CLINICAL DATA:  Cough EXAM: PORTABLE CHEST 1 VIEW COMPARISON:  Chest radiograph dated 08/12/2007 FINDINGS: Normal lung volumes. Hazy lateral right upper lung opacity. No pleural effusion or pneumothorax. The heart size and mediastinal contours are within normal limits. No acute osseous abnormality. IMPRESSION: Hazy lateral right upper lung opacity, which may reflect infection in the appropriate clinical setting. Electronically Signed   By: Agustin Cree M.D.   On: 06/11/2023 15:03    Procedures .Critical Care  Performed by: Elpidio Anis, PA-C Authorized by: Elpidio Anis, PA-C   Critical care provider statement:    Critical care time (minutes):  30   Critical care was necessary to treat or prevent imminent or life-threatening deterioration of the following conditions:  Circulatory failure   Critical care was time spent personally by me on the following  activities:  Discussions with consultants, examination of patient, ordering and performing treatments and interventions, ordering and review of laboratory studies, re-evaluation of patient's condition and review of old charts     Medications Ordered in ED Medications  0.9 %  sodium chloride infusion (Manually program via Guardrails IV Fluids) (has no administration in time range)    ED Course/ Medical Decision Making/ A&P                                 Medical Decision Making This patient presents to the ED for concern of anemia, this involves an extensive number of treatment options, and is a complaint that carries with it a high risk of complications and morbidity.  The differential diagnosis includes acute blood loss, iron deficiency, hemolytic anemia   Co morbidities that complicate the patient evaluation  Long h/o Crohn's s/p colectomy, chronic pain under Pain Management, arthritis, COPD   Additional history obtained:  Additional history and/or information obtained from chart review, notable for Most recent endoscopies (02/2023),    Lab Tests:  I Ordered, and personally interpreted labs.  The pertinent results include:  WBC 2.5, hgb 5.5 MCV 77.5, plts 100; normal renal function; anemia panel pending; hemoccult stool card negative    Imaging Studies ordered:  I ordered imaging studies including CXR: per radiologist interpretation:  IMPRESSION: Hazy lateral right upper lung opacity, which may reflect infection in the appropriate clinical setting.      Critical Interventions:  Blood transfusion started in ED for hgb 5.5   Consultations Obtained:  I requested consultation with the Dr. Anders Simmonds, oncology,  and discussed lab and imaging findings as well as pertinent plan - they will provide consultation while inpatient.    Problem List / ED Course:  Sent to ED with low hemoglobin, no history of transfusions in the past Symptoms of lightheadedness/dizziness,  fatigue Hemodynamically stable  Hgb 5.5 - transfusion started Discussed with hospitalist regarding admission   Social Determinants of Health:  No PCP   Disposition:  After consideration of the diagnostic results and the patients response to treatment, I feel that the patient would benefit from admission for further work up and treatment. .   Amount and/or Complexity of Data Reviewed Labs: ordered. Radiology: ordered.  Risk Prescription drug management. Decision  regarding hospitalization.           Final Clinical Impression(s) / ED Diagnoses Final diagnoses:  Anemia, unspecified type  Pancytopenia [facility])    Rx / DC Orders ED Discharge Orders     None         Elpidio Anis, PA-C 06/11/23 1653    Elpidio Anis, PA-C 06/11/23 1654    Benjiman Core, MD 06/12/23 [phone]

## 2023-06-11 NOTE — Assessment & Plan Note (Signed)
 Likely related to Crohn's disease. She is on [name].  Last time she had any rectal bleeding was over 3 weeks ago.  She reports stools right now are solid and brown. Patient is status post colonoscopy and EGD in November 2024 which showed few diverticula and few esophageal varices. Patient is guaiac negative today. Consider GI consultation if recurrent bleeding seems to be going on.

## 2023-06-11 NOTE — Assessment & Plan Note (Signed)
 On [name] Does not appear to be having an acute flare at this moment.

## 2023-06-11 NOTE — Assessment & Plan Note (Signed)
 Continue home pain meds

## 2023-06-11 NOTE — Assessment & Plan Note (Signed)
 Unclear etiology likely related to her chronic liver disease, though all of her other liver markers appear normal including T. bili. Will check GGT and 5 nucleotidase

## 2023-06-11 NOTE — ED Triage Notes (Signed)
 Pt came due to a facility called and told her that her hemoglobin has dropped from 9.2 to 6.0 .

## 2023-06-11 NOTE — Assessment & Plan Note (Signed)
 Patient is low and all of her cell lines.  She is iron deficient but also has a low white count and a low platelet count. Will have heme-onc see her for this. Her most pressing matter is her severe anemia with hemoglobin of 5.5.  For this she will be transfused 2 units of packed red blood cells with a repeat hemoglobin in the morning.

## 2023-06-12 DIAGNOSIS — D61818 Other pancytopenia: Secondary | ICD-10-CM | POA: Diagnosis not present

## 2023-06-12 DIAGNOSIS — D500 Iron deficiency anemia secondary to blood loss (chronic): Secondary | ICD-10-CM | POA: Diagnosis present

## 2023-06-12 DIAGNOSIS — D62 Acute posthemorrhagic anemia: Secondary | ICD-10-CM | POA: Diagnosis present

## 2023-06-12 LAB — GAMMA GT: GGT: 69 U/L — ABNORMAL HIGH (ref 7–50)

## 2023-06-12 LAB — TYPE AND SCREEN
ABO/RH(D): O POS
Antibody Screen: NEGATIVE
Unit division: 0
Unit division: 0

## 2023-06-12 LAB — CBC
HCT: 26.2 % — ABNORMAL LOW (ref 36.0–46.0)
Hemoglobin: 8.5 g/dL — ABNORMAL LOW (ref 12.0–15.0)
MCH: 26.4 pg (ref 26.0–34.0)
MCHC: 32.4 g/dL (ref 30.0–36.0)
MCV: 81.4 fL (ref 80.0–100.0)
Platelets: 99 10*3/uL — ABNORMAL LOW (ref 150–400)
RBC: 3.22 MIL/uL — ABNORMAL LOW (ref 3.87–5.11)
RDW: 16.4 % — ABNORMAL HIGH (ref 11.5–15.5)
WBC: 3.8 10*3/uL — ABNORMAL LOW (ref 4.0–10.5)
nRBC: 0.8 % — ABNORMAL HIGH (ref 0.0–0.2)

## 2023-06-12 LAB — BPAM RBC
Blood Product Expiration Date: 202503192359
Blood Product Expiration Date: 202503192359
ISSUE DATE / TIME: 202502221754
ISSUE DATE / TIME: 202502222141
Unit Type and Rh: 5100
Unit Type and Rh: 5100

## 2023-06-12 LAB — HIV ANTIBODY (ROUTINE TESTING W REFLEX): HIV Screen 4th Generation wRfx: NONREACTIVE

## 2023-06-12 MED ORDER — PANTOPRAZOLE SODIUM 40 MG IV SOLR
40.0000 mg | Freq: Two times a day (BID) | INTRAVENOUS | Status: DC
  Administered 2023-06-12 – 2023-06-14 (×4): 40 mg via INTRAVENOUS
  Filled 2023-06-12 (×4): qty 10

## 2023-06-12 NOTE — Progress Notes (Signed)
 PROGRESS NOTE  [name], is a 63 y.o. female, DOB - 03-05-1961, KZS:[id number]  Admit date - 06/11/2023   Admitting Physician Reva Bores, MD  Outpatient Primary MD for the patient is Patient, No Pcp Per  LOS - 1  Chief Complaint  Patient presents with   abnormal labs      -Brief summary - 63 year old with past medical history relevant for chronic pancreatitis, inflammatory arthritis for which she has chronic opioid use.  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 and had recent rectal bleeding in January 2025,  Her last GI evaluation with EGD and colonoscopy were in November 2024 (EGD she did have grade 1 esophageal varices, colonoscopy showed diverticulosis), Patient also has a history of Liver cirrhosis of the liver thought to be related to Imuran use for treatment of her Crohn's disease-admitted on 06/11/2023 with pancytopenia and acute on chronic anemia with hemoglobin of 5.5, she was Hemoccult negative   -Assessment and Plan: 1)Acute on Chronic Symptomatic Anemia--patient with chronic anemia in the setting of Liver cirrhosis suspect superimposed acute GI blood loss in the setting of esophageal varices and thrombocytopenia--patient reported episode of rectal bleeding in late January 2025 -Hgb was down to 5.5, Now up to 8.5 after transfusion of 2 units of PRBC on 06/11/2023 -Hemoccult negative -last GI evaluation with EGD and colonoscopy were in November 2024 (EGD she did have grade 1 esophageal varices, colonoscopy showed diverticulosis) -B12 and folate are Not low -Serum iron , ferritin and iron saturation are all low---consistent with iron deficiency anemia--suspect ongoing blood loss --Give IV Protonix 40 every 12 -GI consult for possible Endoluminal evaluation requested  2)Pancytopenia --suspect related to underlying Liver cirrhosis -Platelets around 100 K which is not too far from prior  baseline -Leukopenia is somewhat new -WBC trending up -Hgb improving with transfusion  3)Alkaline phosphatase elevation--- Unclear etiology likely related to her chronic liver disease, though all of her other liver markers appear normal including T. bili. -GGT is mildly elevated at 69 -Nucleotidase 5 pending -- Patient with history of Imuran induced liver cirrhosis  5)Crohn's disease involving terminal ileum (HCC) currently being treated with Cristy Folks which was changed from Humira -Patient reports episode of rectal bleeding in late January 2025 -GI consult requested  6)LOW BACK PAIN/inflammatory arthritis Continue home pain meds -Avoid NSAIDs--given acute on chronic anemia  7) history of Imuran induced liver cirrhosis--with esophageal varices and concerns for portal hypertension -- Continue Coreg, Lasix, and Aldactone  Status is: Inpatient   Disposition: The patient is from: Home              Anticipated d/c is to: Home              Anticipated d/c date is: 1 day              Patient currently is not medically stable to d/c. Barriers: Not Clinically Stable-   Code Status :  -  Code Status: Full Code   Family Communication:    NA (patient is alert, awake and coherent)   DVT Prophylaxis  :   - SCDs /Gi Bleed / SCDs Start: 06/11/23 1805   Lab Results  Component Value Date   PLT 99 (L) 06/12/2023   Inpatient Medications  Scheduled Meds:  azithromycin  250 mg Oral Daily   carvedilol  3.125 mg Oral BID WC   Chlorhexidine Gluconate Cloth  6 each Topical Daily  famotidine  20 mg Oral Daily   furosemide  40 mg Oral Daily   multivitamin with minerals  1 tablet Oral Daily   oxyCODONE  20 mg Oral BID   pantoprazole  40 mg Oral Daily   potassium chloride SA  20 mEq Oral Daily   spironolactone  100 mg Oral Daily   Continuous Infusions: PRN Meds:.fentaNYL (SUBLIMAZE) injection, ibuprofen, metoprolol tartrate, ondansetron, polyethylene glycol  Anti-infectives (From  admission, onward)    Start     Dose/Rate Route Frequency Ordered Stop   06/12/23 1000  azithromycin (ZITHROMAX) tablet 250 mg        250 mg Oral Daily 06/11/23 1805        Subjective: [name] today has no fevers, no emesis,  No chest pain,    No Dyspnea  No abd pain  No diarrhea----No bleeding concerns  Fatigue and generalized weakness improving   Objective: Vitals:   06/12/23 0739 06/12/23 0800 06/12/23 0900 06/12/23 1140  BP:  (!) 160/142 (!) 141/57   Pulse: (!) 50 75 (!) 55   Resp: (!) 9 16 (!) 21   Temp: 98.2 F (36.8 C)   97.8 F (36.6 C)  TempSrc: Oral   Oral  SpO2: 95% 100% 98%   Weight:      Height:        Intake/Output Summary (Last 24 hours) at 06/12/2023 1502 Last data filed at 06/12/2023 0845 Gross per 24 hour  Intake 1405 ml  Output --  Net 1405 ml   Filed Weights   06/11/23 1301 06/11/23 2000  Weight: 48.5 kg 47.6 kg    Physical Exam  Gen:- Awake Alert,  in no apparent distress  HEENT:- [place].AT, No sclera icterus Neck-Supple Neck,No JVD,.  Lungs-  CTAB , fair symmetrical air movement CV- S1, S2 normal, regular  Abd-  +ve B.Sounds, Abd Soft, No tenderness,    Extremity/Skin:- +ve  edema, pedal pulses present  Psych-affect is appropriate, oriented x3 Neuro-no new focal deficits, no tremors  Data Reviewed: I have personally reviewed following labs and imaging studies  CBC: Recent Labs  Lab 06/11/23 1459 06/12/23 0602  WBC 2.5* 3.8*  NEUTROABS 1.9  --   HGB 5.5* 8.5*  HCT 18.3* 26.2*  MCV 77.5* 81.4  PLT 100* 99*   Basic Metabolic Panel: Recent Labs  Lab 06/11/23 1459  NA 137  K 4.3  CL 108  CO2 20*  GLUCOSE 124*  BUN 6*  CREATININE 0.61  CALCIUM 8.4*   GFR: Estimated Creatinine Clearance: 53.4 mL/min (by C-G formula based on SCr of 0.61 mg/dL). Liver Function Tests: Recent Labs  Lab 06/11/23 1459  AST 28  ALT 16  ALKPHOS 134*  BILITOT 0.7  PROT 6.5  ALBUMIN 3.1*   Recent Results (from the past 240 hours)  Resp  panel by RT-PCR (RSV, Flu A&B, Covid) Anterior Nasal Swab     Status: None   Collection Time: 06/11/23  2:13 PM   Specimen: Anterior Nasal Swab  Result Value Ref Range Status   SARS Coronavirus 2 by RT PCR NEGATIVE NEGATIVE Final    Comment: (NOTE) SARS-CoV-2 target nucleic acids are NOT DETECTED.  The SARS-CoV-2 RNA is generally detectable in upper respiratory specimens during the acute phase of infection. The lowest concentration of SARS-CoV-2 viral copies this assay can detect is 138 copies/mL. A negative result does not preclude SARS-Cov-2 infection and should not be used as the sole basis for treatment or other patient management decisions. A negative result  may occur with  improper specimen collection/handling, submission of specimen other than nasopharyngeal swab, presence of viral mutation(s) within the areas targeted by this assay, and inadequate number of viral copies(<138 copies/mL). A negative result must be combined with clinical observations, patient history, and epidemiological information. The expected result is Negative.  Fact Sheet for Patients:  BloggerCourse.com  Fact Sheet for Healthcare Providers:  SeriousBroker.it  This test is no t yet approved or cleared by the [place] FDA and  has been authorized for detection and/or diagnosis of SARS-CoV-2 by FDA under an Emergency Use Authorization (EUA). This EUA will remain  in effect (meaning this test can be used) for the duration of the COVID-19 declaration under Section 564(b)(1) of the Act, 21 U.S.C.section 360bbb-3(b)(1), unless the authorization is terminated  or revoked sooner.       Influenza A by PCR NEGATIVE NEGATIVE Final   Influenza B by PCR NEGATIVE NEGATIVE Final    Comment: (NOTE) The Xpert Xpress SARS-CoV-2/FLU/RSV plus assay is intended as an aid in the diagnosis of influenza from Nasopharyngeal swab specimens and should not be used as a  sole basis for treatment. Nasal washings and aspirates are unacceptable for Xpert Xpress SARS-CoV-2/FLU/RSV testing.  Fact Sheet for Patients: BloggerCourse.com  Fact Sheet for Healthcare Providers: SeriousBroker.it  This test is not yet approved or cleared by the [place] FDA and has been authorized for detection and/or diagnosis of SARS-CoV-2 by FDA under an Emergency Use Authorization (EUA). This EUA will remain in effect (meaning this test can be used) for the duration of the COVID-19 declaration under Section 564(b)(1) of the Act, 21 U.S.C. section 360bbb-3(b)(1), unless the authorization is terminated or revoked.     Resp Syncytial Virus by PCR NEGATIVE NEGATIVE Final    Comment: (NOTE) Fact Sheet for Patients: BloggerCourse.com  Fact Sheet for Healthcare Providers: SeriousBroker.it  This test is not yet approved or cleared by the [place] FDA and has been authorized for detection and/or diagnosis of SARS-CoV-2 by FDA under an Emergency Use Authorization (EUA). This EUA will remain in effect (meaning this test can be used) for the duration of the COVID-19 declaration under Section 564(b)(1) of the Act, 21 U.S.C. section 360bbb-3(b)(1), unless the authorization is terminated or revoked.  Performed at [facility], [address] [place], [place] [address]   MRSA Next Gen by PCR, Nasal     Status: None   Collection Time: 06/11/23  7:58 PM   Specimen: Nasal Mucosa; Nasal Swab  Result Value Ref Range Status   MRSA by PCR Next Gen NOT DETECTED NOT DETECTED Final    Comment: (NOTE) The GeneXpert MRSA Assay (FDA approved for NASAL specimens only), is one component of a comprehensive MRSA colonization surveillance program. It is not intended to diagnose MRSA infection nor to guide or monitor treatment for MRSA infections. Test performance is not FDA approved in  patients less than 61 years old. Performed at [facility], [address], [place], [place] [address]    Radiology Studies: DG Chest Portable 1 View Result Date: 06/11/2023 CLINICAL DATA:  Cough EXAM: PORTABLE CHEST 1 VIEW COMPARISON:  Chest radiograph dated 08/12/2007 FINDINGS: Normal lung volumes. Hazy lateral right upper lung opacity. No pleural effusion or pneumothorax. The heart size and mediastinal contours are within normal limits. No acute osseous abnormality. IMPRESSION: Hazy lateral right upper lung opacity, which may reflect infection in the appropriate clinical setting. Electronically Signed   By: Agustin Cree M.D.   On: 06/11/2023 15:03  Scheduled Meds:  azithromycin  250 mg Oral Daily   carvedilol  3.125 mg Oral BID WC   Chlorhexidine Gluconate Cloth  6 each Topical Daily   famotidine  20 mg Oral Daily   furosemide  40 mg Oral Daily   multivitamin with minerals  1 tablet Oral Daily   oxyCODONE  20 mg Oral BID   pantoprazole  40 mg Oral Daily   potassium chloride SA  20 mEq Oral Daily   spironolactone  100 mg Oral Daily   Continuous Infusions:   LOS: 1 day   Shon Hale M.D on 06/12/2023 at 3:02 PM  Go to [email or web address] - for contact info  [facility] - Office  [phone]  If 7PM-7AM, please contact night-coverage [email or web address] 06/12/2023, 3:02 PM

## 2023-06-12 NOTE — Care Management CC44 (Signed)
 Condition Code 44 Documentation Completed  Patient Details  Name: WHITNI PASQUINI MRN: [id number] Date of Birth: [birth year]   Condition Code 44 given:  Yes Patient signature on Condition Code 44 notice:  Yes Documentation of 2 MD's agreement:  Yes Code 44 added to claim:  Yes    Armanda Heritage, RN 06/12/2023, 3:56 PM

## 2023-06-12 NOTE — Progress Notes (Signed)
   06/12/23 0916  TOC Brief Assessment  Insurance and Status Reviewed  Patient has primary care physician No  Home environment has been reviewed From home  Prior level of function: Independent  Prior/Current Home Services No current home services  Social Drivers of Health Review SDOH reviewed no interventions necessary  Readmission risk has been reviewed Yes Chilton Si)  Transition of care needs no transition of care needs at this time       Transition of Care Department (TOC) has reviewed patient and no TOC needs have been identified at this time. We will continue to monitor patient advancement through interdisciplinary progression rounds. If new patient transition needs arise, please place a TOC consult.

## 2023-06-12 NOTE — Care Management Obs Status (Signed)
 MEDICARE OBSERVATION STATUS NOTIFICATION   Patient Details  Name: [name] MRN: [id number] Date of Birth: December 12, 1960   Medicare Observation Status Notification Given:  Yes    Armanda Heritage, RN 06/12/2023, 3:56 PM

## 2023-06-13 ENCOUNTER — Encounter (HOSPITAL_COMMUNITY): Payer: Self-pay | Admitting: Family Medicine

## 2023-06-13 DIAGNOSIS — D62 Acute posthemorrhagic anemia: Secondary | ICD-10-CM

## 2023-06-13 DIAGNOSIS — Z1152 Encounter for screening for COVID-19: Secondary | ICD-10-CM | POA: Diagnosis not present

## 2023-06-13 DIAGNOSIS — R748 Abnormal levels of other serum enzymes: Secondary | ICD-10-CM | POA: Diagnosis not present

## 2023-06-13 DIAGNOSIS — D61818 Other pancytopenia: Secondary | ICD-10-CM | POA: Diagnosis not present

## 2023-06-13 DIAGNOSIS — K5000 Crohn's disease of small intestine without complications: Secondary | ICD-10-CM | POA: Diagnosis not present

## 2023-06-13 DIAGNOSIS — Z8719 Personal history of other diseases of the digestive system: Secondary | ICD-10-CM

## 2023-06-13 LAB — CBC WITH DIFFERENTIAL/PLATELET
Abs Immature Granulocytes: 0.03 10*3/uL (ref 0.00–0.07)
Basophils Absolute: 0 10*3/uL (ref 0.0–0.1)
Basophils Relative: 1 %
Eosinophils Absolute: 0.1 10*3/uL (ref 0.0–0.5)
Eosinophils Relative: 3 %
HCT: 24.7 % — ABNORMAL LOW (ref 36.0–46.0)
Hemoglobin: 7.8 g/dL — ABNORMAL LOW (ref 12.0–15.0)
Immature Granulocytes: 1 %
Lymphocytes Relative: 22 %
Lymphs Abs: 1 10*3/uL (ref 0.7–4.0)
MCH: 25.4 pg — ABNORMAL LOW (ref 26.0–34.0)
MCHC: 31.6 g/dL (ref 30.0–36.0)
MCV: 80.5 fL (ref 80.0–100.0)
Monocytes Absolute: 0.6 10*3/uL (ref 0.1–1.0)
Monocytes Relative: 13 %
Neutro Abs: 2.8 10*3/uL (ref 1.7–7.7)
Neutrophils Relative %: 60 %
Platelets: 108 10*3/uL — ABNORMAL LOW (ref 150–400)
RBC: 3.07 MIL/uL — ABNORMAL LOW (ref 3.87–5.11)
RDW: 17 % — ABNORMAL HIGH (ref 11.5–15.5)
WBC: 4.5 10*3/uL (ref 4.0–10.5)
nRBC: 0 % (ref 0.0–0.2)

## 2023-06-13 LAB — HEPATIC FUNCTION PANEL
ALT: 17 U/L (ref 0–44)
AST: 44 U/L — ABNORMAL HIGH (ref 15–41)
Albumin: 2.6 g/dL — ABNORMAL LOW (ref 3.5–5.0)
Alkaline Phosphatase: 118 U/L (ref 38–126)
Bilirubin, Direct: 0.2 mg/dL (ref 0.0–0.2)
Indirect Bilirubin: 0.5 mg/dL (ref 0.3–0.9)
Total Bilirubin: 0.7 mg/dL (ref 0.0–1.2)
Total Protein: 5.8 g/dL — ABNORMAL LOW (ref 6.5–8.1)

## 2023-06-13 LAB — RENAL FUNCTION PANEL
Albumin: 2.7 g/dL — ABNORMAL LOW (ref 3.5–5.0)
Anion gap: 6 (ref 5–15)
BUN: 7 mg/dL — ABNORMAL LOW (ref 8–23)
CO2: 22 mmol/L (ref 22–32)
Calcium: 7.7 mg/dL — ABNORMAL LOW (ref 8.9–10.3)
Chloride: 109 mmol/L (ref 98–111)
Creatinine, Ser: 0.75 mg/dL (ref 0.44–1.00)
GFR, Estimated: >60 mL/min (ref >60)
Glucose, Bld: 90 mg/dL (ref 70–99)
Phosphorus: 3.7 mg/dL (ref 2.5–4.6)
Potassium: 3.4 mmol/L — ABNORMAL LOW (ref 3.5–5.1)
Sodium: 137 mmol/L (ref 135–145)

## 2023-06-13 LAB — PROTIME-INR
INR: 3.4 — ABNORMAL HIGH (ref 0.8–1.2)
Prothrombin Time: 34.2 s — ABNORMAL HIGH (ref 11.4–15.2)

## 2023-06-13 MED ORDER — POTASSIUM CHLORIDE CRYS ER 20 MEQ PO TBCR
40.0000 meq | EXTENDED_RELEASE_TABLET | ORAL | Status: AC
Start: 2023-06-13 — End: 2023-06-13
  Administered 2023-06-13 (×2): 40 meq via ORAL
  Filled 2023-06-13 (×2): qty 2

## 2023-06-13 MED ORDER — VITAMIN K1 10 MG/ML IJ SOLN: 10.0000 mg | Freq: Once | INTRAMUSCULAR | Status: DC

## 2023-06-13 MED ORDER — VITAMIN K1 10 MG/ML IJ SOLN
10.0000 mg | Freq: Once | INTRAVENOUS | Status: AC
  Administered 2023-06-13: 10 mg via INTRAVENOUS
  Filled 2023-06-13: qty 1

## 2023-06-13 NOTE — Progress Notes (Signed)
 Pt transitioned from SD to Tele overnight. She remained bradycardic around 50's all night. She ambulated unassisted in room and c/o her normal chronic pain. No acute events overnight. Kellogg RN

## 2023-06-13 NOTE — Plan of Care (Signed)
  Problem: Education: Goal: Knowledge of General Education information will improve Description: Including pain rating scale, medication(s)/side effects and non-pharmacologic comfort measures Outcome: Progressing   Problem: Health Behavior/Discharge Planning: Goal: Ability to manage health-related needs will improve Outcome: Progressing   Problem: Activity: Goal: Risk for activity intolerance will decrease Outcome: Progressing   Problem: Nutrition: Goal: Adequate nutrition will be maintained Outcome: Not Progressing   

## 2023-06-13 NOTE — Consult Note (Cosign Needed Addendum)
 Gastroenterology Consult   Referring Provider: Dr. Shon Hale Primary Care Physician:  Patient, No Pcp Per Primary Gastroenterologist:  Dr. Levon Hedger   Patient ID: [name]; [id number]; February 15, 1961   Admit date: 06/11/2023  LOS: 1 day   Date of Consultation: 06/13/2023  Reason for Consultation:  Acute on chronic anemia  History of Present Illness   [name] is a 63 y.o. year old female with a history of ileal Crohn's disease s/p small bowel resection, COPD, chronic pancreatitis, RA, GERD, suspected possible EPI, cirrhosis suspected due to prior 6-MP and full serologies negative for other etiologies, chronic biliary dilatation and seen in consultation by [facility], presenting to the ED at the request of PCP due to acute anemia.  Hgb 5.5 on presentation to ED, receiving 2 units PRBCs and improvement to 8.5 This morning, Hgb 7.8. Ferritin markedly low at 7. Prior Hgb in Oct 2024 on file was 10.4. heme negative in the ED.   Today: Had gone to [facility] and was told her blood was low. Called by PCP and requested to go to the ED. Stool brown this morning. She noted that in between change from Humira to [facility] she had dark red bloody BMs for 2-3 weeks. No diarrhea. States moderate bleeding during this time. Last blood seen was about a month ago. Decided she would just monitor this and it stopped on its own. Started on [place] in Dec 2024. Most recent dose 05/20/23 infusion. Has the [name] injections at home to start in March 2025. No diarrhea. Has had decreased appetite. Notes lower abdominal tenderness that started after coughing from URI a few weeks ago. Trying to get congestion to break up. Stays nauseated. Has been taking Ibuprofen intermittently as had fever up to 102 a few weeks ago when battling what she believes was the flu. Afebrile currently. Was placed on prednisone pack by [facility] due to chest congestion when seen on Friday and only got one dose in. Was also  prescribed Z pack. Last BM this morning and no diarrhea. No skips in [place] induction doses. No solid food dysphagia.   States had lapses in therapy during transitions with biologic therapies towards end of last year. 3 BMs daily and dependent on what eating but no diarrhea. Has felt better since episode of rectal bleeding. Feels like she is back on schedule with her body. No fecal urgency. Had been on iron in the past but has backed off of this. Still taking Vitamin D.   Taking carvedilol once daily instead of BID. States it is easier to do this as takes majority of meds once daily.    Prior Crohn's therapies:  6-MP Humira 01/2022 started Cristy Folks first dose Dec 2024.     EGD Nov 2024: Grade 1 esophageal varices, normal stomach, normal duodenum, start carvedilol 3.125 mg BID  Colonoscopy Nov 2024: Crohn's ileitis, stricture in TI, patent end-to side ileocolonic anastomosis with friable mucosa, diverticulosis,    Past Medical History:  Diagnosis Date   Anorexia 10/01/2010   Arthritis    Chronic pain syndrome    bones and stomach   COPD (chronic obstructive pulmonary disease) (HCC)    Crohn's 10/01/2010   Diarrhea 10/01/2010   Emesis 10/01/2010   GERD (gastroesophageal reflux disease)    Lower abdominal pain 10/01/2010   Pancreatitis    Rectal bleeding 10/01/2010   Seasonal allergies     Past Surgical History:  Procedure Laterality Date    Partial Hysterectomy  about 20  years ago   BIOPSY  06/23/2021   Procedure: BIOPSY;  Surgeon: Dolores Frame, MD;  [facility];  Service: Gastroenterology;;   BIOPSY  03/08/2023   Procedure: BIOPSY;  Surgeon: Dolores Frame, MD;  [facility]: [facility];  Service: Gastroenterology;;   CHOLECYSTECTOMY     COLECTOMY  1995   x2   COLONOSCOPY  01/17/2006   October 2007 revealinga ileal ulcers proximal to anastomosis without stricture and a single rectal ulcer.   COLONOSCOPY WITH PROPOFOL N/A 04/02/2016    Procedure: COLONOSCOPY WITH PROPOFOL;  Surgeon: Malissa Hippo, MD;  [facility]: [facility];  Service: Endoscopy;  Laterality: N/A;  1:00   COLONOSCOPY WITH PROPOFOL N/A 06/23/2021   Procedure: COLONOSCOPY WITH PROPOFOL;  Surgeon: Dolores Frame, MD;  [facility]: [facility];  Service: Gastroenterology;  Laterality: N/A;  805 ASA 1   COLONOSCOPY WITH PROPOFOL N/A 03/08/2023   Procedure: COLONOSCOPY WITH PROPOFOL;  Surgeon: Dolores Frame, MD;  [facility];  Service: Gastroenterology;  Laterality: N/A;  1:45PM;ASA 3   ESOPHAGOGASTRODUODENOSCOPY (EGD) WITH PROPOFOL N/A 11/21/2013   Procedure: ESOPHAGOGASTRODUODENOSCOPY (EGD) WITH PROPOFOL;  Surgeon: Malissa Hippo, MD;  Location: [facility];  Service: Endoscopy;  Laterality: N/A;   ESOPHAGOGASTRODUODENOSCOPY (EGD) WITH PROPOFOL N/A 03/08/2023   Procedure: ESOPHAGOGASTRODUODENOSCOPY (EGD) WITH PROPOFOL;  Surgeon: Dolores Frame, MD;  [facility];  Service: Gastroenterology;  Laterality: N/A;   HERNIA REPAIR     TOOTH EXTRACTION Left 10/17/2013   left lower molar    Prior to Admission medications   Medication Sig Start Date End Date Taking? Authorizing Provider  alendronate (FOSAMAX) 70 MG tablet Take 70 mg by mouth once a week. 05/06/23  Yes [provider]  azithromycin (ZITHROMAX) 250 MG tablet Take 250 mg by mouth daily. 06/10/23  Yes [provider]  carvedilol (COREG) 3.125 MG tablet Take 1 tablet (3.125 mg total) by mouth 2 (two) times daily with a meal. 03/08/23  Yes Marguerita Merles, Daniel, MD  famotidine (PEPCID) 20 MG tablet Take 20 mg by mouth daily.   Yes [provider]  furosemide (LASIX) 40 MG tablet Take 1 tablet (40 mg total) by mouth daily. 02/28/23 02/28/24 Yes Carlan, Chelsea L, NP  Multiple Vitamin (MULTIVITAMIN WITH MINERALS) TABS tablet Take 1 tablet by mouth daily.   Yes [provider]  ondansetron (ZOFRAN) 4 MG tablet Take 1 tablet  (4 mg total) by mouth every 8 (eight) hours as needed for nausea or vomiting. 07/28/22  Yes Dolores Frame, MD  OXYCONTIN 20 MG 12 hr tablet Take 20 mg by mouth 2 (two) times daily.   Yes [provider]  pantoprazole (PROTONIX) 40 MG tablet Take 40 mg by mouth daily.   Yes [provider]  potassium chloride SA (KLOR-CON M) 20 MEQ tablet Take 1 tablet (20 mEq total) by mouth daily for 3 days. 03/16/23 06/11/23 Yes Letta Median, PA-C  predniSONE (STERAPRED UNI-PAK 21 TAB) 5 MG (21) TBPK tablet Take 5 mg by mouth as directed. 06/10/23  Yes [provider]  [name] 360 MG/2.4ML SOCT by Intravenous (Continuous Infusion) route every 3 (three) months. 05/11/23  Yes [provider]  spironolactone (ALDACTONE) 100 MG tablet Take 1 tablet (100 mg total) by mouth daily. 02/28/23 02/28/24 Yes Carlan, Chelsea L, NP  Turmeric (QC TUMERIC COMPLEX PO) Take by mouth daily at 6 (six) AM.   Yes [provider]  Vitamin D, Ergocalciferol, (DRISDOL) 1.25 MG (  50000 UNIT) CAPS capsule Take 50,000 Units by mouth once a week. 06/10/23  Yes [provider]    Current Facility-Administered Medications  Medication Dose Route Frequency Provider Last Rate Last Admin   azithromycin (ZITHROMAX) tablet 250 mg  250 mg Oral Daily Reva Bores, MD   250 mg at 06/12/23 1610   carvedilol (COREG) tablet 3.125 mg  3.125 mg Oral BID WC Reva Bores, MD   3.125 mg at 06/13/23 9604   Chlorhexidine Gluconate Cloth 2 % PADS 6 each  6 each Topical Daily Reva Bores, MD   6 each at 06/12/23 5409   fentaNYL (SUBLIMAZE) injection 12.5-50 mcg  12.5-50 mcg Intravenous Q2H PRN Reva Bores, MD   50 mcg at 06/11/23 1933   furosemide (LASIX) tablet 40 mg  40 mg Oral Daily Reva Bores, MD   40 mg at 06/12/23 8119   metoprolol tartrate (LOPRESSOR) injection 5 mg  5 mg Intravenous Q6H PRN Reva Bores, MD       multivitamin with minerals tablet 1 tablet  1 tablet Oral Daily  Reva Bores, MD   1 tablet at 06/12/23 0904   ondansetron (ZOFRAN) tablet 4 mg  4 mg Oral Q8H PRN Reva Bores, MD       oxyCODONE (OXYCONTIN) 12 hr tablet 20 mg  20 mg Oral BID Reva Bores, MD   20 mg at 06/12/23 2217   pantoprazole (PROTONIX) injection 40 mg  40 mg Intravenous Q12H Emokpae, Courage, MD   40 mg at 06/12/23 1729   polyethylene glycol (MIRALAX / GLYCOLAX) packet 17 g  17 g Oral Daily PRN Reva Bores, MD       potassium chloride SA (KLOR-CON M) CR tablet 20 mEq  20 mEq Oral Daily Reva Bores, MD   20 mEq at 06/12/23 1478   potassium chloride SA (KLOR-CON M) CR tablet 40 mEq  40 mEq Oral Q3H Emokpae, Courage, MD       spironolactone (ALDACTONE) tablet 100 mg  100 mg Oral Daily Reva Bores, MD   100 mg at 06/12/23 2956    Allergies as of 06/11/2023   (No Known Allergies)    Family History  Problem Relation Age of Onset   Dementia Mother    Heart attack Mother        x 2   Heart disease Father    Heart attack Father    Diabetes Sister    Healthy Daughter    Healthy Son    Dementia Maternal Grandmother    Cancer Maternal Grandmother        breast    Social History   Socioeconomic History   Marital status: Divorced    Spouse name: Not on file   Number of children: 2   Years of education: Not on file   Highest education level: Not on file  Occupational History   Occupation: disabled  Tobacco Use   Smoking status: Former    Current packs/day: 0.00    Average packs/day: 0.3 packs/day for 35.0 years (8.8 ttl pk-yrs)    Types: Cigarettes    Start date: 11/28/1980    Quit date: 11/29/2015    Years since quitting: 7.5    Passive exposure: Past   Smokeless tobacco: Never  Vaping Use   Vaping status: Never Used  Substance and Sexual Activity   Alcohol use: No   Drug use: No   Sexual activity: Not Currently  Birth control/protection: Surgical    Comment: hyst  Other Topics Concern   Not on file  Social History Narrative   Not on file    Social Drivers of Health   Financial Resource Strain: Low Risk  (06/02/2022)   Overall Financial Resource Strain (CARDIA)    Difficulty of Paying Living Expenses: Not very hard  Food Insecurity: No Food Insecurity (06/11/2023)   Hunger Vital Sign    Worried About Running Out of Food in the Last Year: Never true    Ran Out of Food in the Last Year: Never true  Transportation Needs: No Transportation Needs (06/11/2023)   PRAPARE - Administrator, Civil Service (Medical): No    Lack of Transportation (Non-Medical): No  Physical Activity: Insufficiently Active (06/02/2022)   Exercise Vital Sign    Days of Exercise per Week: 3 days    Minutes of Exercise per Session: 30 min  Stress: No Stress Concern Present (06/02/2022)   [employer] of Occupational Health - Occupational Stress Questionnaire    Feeling of Stress : Not at all  Social Connections: Moderately Integrated (06/02/2022)   Social Connection and Isolation Panel [NHANES]    Frequency of Communication with Friends and Family: More than three times a week    Frequency of Social Gatherings with Friends and Family: Three times a week    Attends Religious Services: More than 4 times per year    Active Member of Clubs or Organizations: Yes    Attends Banker Meetings: More than 4 times per year    Marital Status: Divorced  Intimate Partner Violence: Not At Risk (06/11/2023)   Humiliation, Afraid, Rape, and Kick questionnaire    Fear of Current or Ex-Partner: No    Emotionally Abused: No    Physically Abused: No    Sexually Abused: No     Review of Systems   Gen: Denies any fever, chills, loss of appetite, change in weight or weight loss CV: Denies chest pain, heart palpitations, syncope, edema  Resp: Denies shortness of breath with rest, cough, wheezing, coughing up blood, and pleurisy. GI:see HPI GU : Denies urinary burning, blood in urine, urinary frequency, and urinary incontinence. MS: Denies  joint pain, limitation of movement, swelling, cramps, and atrophy.  Derm: Denies rash, itching, dry skin, hives. Psych: Denies depression, anxiety, memory loss, hallucinations, and confusion. Heme: Denies bruising or bleeding Neuro:  Denies any headaches, dizziness, paresthesias, shaking  Physical Exam   Vital Signs in last 24 hours: Temp:  [97.6 F (36.4 C)-98.5 F (36.9 C)] 98 F (36.7 C) (02/24 0611) Pulse Rate:  [53-65] 65 (02/24 0734) Resp:  [7-21] 16 (02/24 0611) BP: (107-141)/(52-92) 110/66 (02/24 0734) SpO2:  [93 %-98 %] 93 % (02/24 0600) Weight:  [47.4 kg] 47.4 kg (02/23 2213) Last BM Date : 06/11/23  General:   Alert,  Well-developed, well-nourished, pleasant and cooperative in NAD Head:  Normocephalic and atraumatic. Eyes:  Sclera clear, no icterus.    Ears:  Normal auditory acuity. Lungs:  expiratory wheeze bilaterally, mild, no distress Heart:  S1 S2 present without murmurs Abdomen:  Soft, nontender and nondistended. No masses, hepatosplenomegaly or hernias noted. Normal bowel sounds, without guarding, and without rebound.   Rectal: deferred   Msk:  Symmetrical without gross deformities. Normal posture. Extremities:  Without edema. Neurologic:  Alert and  oriented x4. Negative asterixis  Psych:  Alert and cooperative. Normal mood and affect.  Intake/Output from previous day: 02/23 0701 - 02/24 0700  In: 840 [P.O.:840] Out: -  Intake/Output this shift: No intake/output data recorded.    Labs/Studies   Recent Labs Recent Labs    06/11/23 1459 06/12/23 0602 06/13/23 0507  WBC 2.5* 3.8* 4.5  HGB 5.5* 8.5* 7.8*  HCT 18.3* 26.2* 24.7*  PLT 100* 99* 108*   BMET Recent Labs    06/11/23 1459 06/13/23 0507  NA 137 137  K 4.3 3.4*  CL 108 109  CO2 20* 22  GLUCOSE 124* 90  BUN 6* 7*  CREATININE 0.61 0.75  CALCIUM 8.4* 7.7*   LFT Recent Labs    06/11/23 1459 06/13/23 0507  PROT 6.5  --   ALBUMIN 3.1* 2.7*  AST 28  --   ALT 16  --   ALKPHOS  134*  --   BILITOT 0.7  --    Lab Results  Component Value Date   IRON 24 (L) 06/11/2023   TIBC 427 06/11/2023   FERRITIN 7 (L) 06/11/2023     Radiology/Studies DG Chest Portable 1 View Result Date: 06/11/2023 CLINICAL DATA:  Cough EXAM: PORTABLE CHEST 1 VIEW COMPARISON:  Chest radiograph dated 08/12/2007 FINDINGS: Normal lung volumes. Hazy lateral right upper lung opacity. No pleural effusion or pneumothorax. The heart size and mediastinal contours are within normal limits. No acute osseous abnormality. IMPRESSION: Hazy lateral right upper lung opacity, which may reflect infection in the appropriate clinical setting. Electronically Signed   By: Agustin Cree M.D.   On: 06/11/2023 15:03     Assessment   [name] is a 63 y.o. year old female  with a history of ileal Crohn's disease s/p small bowel resection, COPD, chronic pancreatitis, RA, GERD, suspected possible EPI, cirrhosis suspected due to prior 6-MP and full serologies negative for other etiologies, chronic biliary dilatation and seen in consultation by [facility], presenting to the ED at the request of PCP due to acute anemia.    Acute on chronic anemia: Hgb 5.5 on presentation, receiving 2 units PRBCs with improvement. Hgb 7.8 this morning. She is heme negative and has had no overt GI bleeding. Notably, she does endorse NSAID use when she had the flu several weeks ago, and she also had episodes of rectal bleeding during the change from Humira to [facility] for several weeks. Last overt GI bleeding about a month ago. INR today is 3.4. Known cirrhosis but unclear why such significant coagulopathy. Differentials including meds, acute illness. She is NOT encephalopathic or concern for acute liver failure. In light of coagulopathy, NSAID use, suspect she has had a slow, steady decline in Hgb with notable IDA in setting of known ileal Crohn's disease. Not consistent with variceal or diverticular bleed. She is declining any endoscopic procedures  currently and would like conservative management. EGD and colonoscopy both done recently as noted above.   Crohn's ileitis: [name] induction started Dec 2024, and she has completed the 3 rounds of IV induction. Prior therapies as above. Injections to start around end of Feb/March 2025, and she has this at home. Would benefit from further small bowel evaluation such as MRE. Doubt capsule would be feasible in light of known stricture. Can discuss at outpatient follow-up.   Cirrhosis: suspected due to prior exposure to 6-MP and fatty liver. INR 3.4 today but denying any anticoagulation. MELD 3.0 today is 20, driven by INR. No signs/symptoms of encephalopathy. Known Grade 1 esophageal varices and on carvedilol as outpatient; however, she has been adjusting this dose to just once daily instead of BID.  Will need outpatient follow-up for cirrhosis care.   History of biliary dilation: evaluated by [facility] with repeat MR abdomen showing unchanged intra and extrahepatic biliary duct dilation, no mass or stone, suspected due to post-cholecystectomy state. [facility] favoring follow-up locally and only pursuing EUS +/- ERCP if worsening dilatation. Follow-up as outpatient.     Plan / Recommendations    May have soft diet Vit K 10 mg IV X 1, repeat INR in am, may need additional MELD labs each day Would benefit from IV iron as outpatient and referral to Hematology Keep 3/3 appt with Dr. Levon Hedger: CTE vs MREto consider. Known stricture in TI and likely would not be able to complete [place] capsule Absolutely avoid NSAIDs If worsening anemia while inpatient, recommend EGD if patient is willing.  Keep 3/3 outpatient appt     06/13/2023, 8:43 AM  Gelene Mink, PhD, ANP-BC [facility]

## 2023-06-13 NOTE — Plan of Care (Signed)

## 2023-06-13 NOTE — Progress Notes (Addendum)
 PROGRESS NOTE  [name], is a 63 y.o. female, DOB - 09-03-1960, MVH:[id number]  Admit date - 06/11/2023   Admitting Physician Shon Hale, MD  Outpatient Primary MD for the [name] is [name], No Pcp Per  LOS - 1  Chief Complaint  [name] presents with   abnormal labs      -Brief summary - 63 year old with past medical history relevant for chronic pancreatitis, inflammatory arthritis for which she has chronic opioid use.  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 and had recent rectal bleeding in January 2025,  Her last GI evaluation with EGD and colonoscopy were in November 2024 (EGD she did have grade 1 esophageal varices, colonoscopy showed diverticulosis), [name] also has a history of Liver cirrhosis of the liver thought to be related to Imuran use for treatment of her Crohn's disease-admitted on 06/11/2023 with pancytopenia and acute on chronic anemia with hemoglobin of 5.5, she was Hemoccult negative   -Assessment and Plan: 1)Acute on Chronic Symptomatic Anemia--[name] with chronic anemia in the setting of Liver cirrhosis suspect superimposed acute GI blood loss in the setting of esophageal varices and thrombocytopenia--[name] reported episode of rectal bleeding in late January 2025 -Hgb was down to 5.5 on admission Hgb was 8.5 after transfusion of 2 units of PRBC on 06/11/2023 -Hemoglobin is down to 7.8 on 06/13/2023 -Hemoccult negative -last GI evaluation with EGD and colonoscopy were in November 2024 (EGD she did have grade 1 esophageal varices, colonoscopy showed diverticulosis) -B12 and folate are Not low -Serum iron , ferritin and iron saturation are all low---consistent with iron deficiency anemia--suspect ongoing blood loss --c/n  IV Protonix 40 every 12 -Awaiting GI consult for possible EGD on 06/13/2023 -Remain n.p.o.  2)Pancytopenia --suspect related to underlying Liver  cirrhosis -Platelets trending up -now at 108 k, Not too far from prior baseline -WBC has normalized -Hgb as above #1 -[name] has outpatient appointment with hematologist Dr. Anders Simmonds on 06/17/23 @1100   3)Alkaline phosphatase elevation--- Unclear etiology likely related to her chronic liver disease, though all of her other liver markers appear normal including T. bili. -GGT is mildly elevated at 69 -Nucleotidase 5 pending -- [name] with history of Imuran induced liver cirrhosis  5)Crohn's disease involving terminal ileum (HCC) currently being treated with Cristy Folks which was changed from Humira -[name] reports episode of rectal bleeding in late January 2025 -GI consult pending  6)LOW BACK PAIN/inflammatory arthritis Continue home pain meds -Avoid NSAIDs--given acute on chronic anemia  7) history of Imuran induced Liver cirrhosis--with esophageal varices and concerns for portal hypertension -LFTs noted,  INR 3.4--May consider vitamin K -- Continue Coreg, Lasix, and Aldactone  Status is: Inpatient   Disposition: The [name] is from: Home              Anticipated d/c is to: Home              Anticipated d/c date is: 1 day              [name] currently is not medically stable to d/c. Barriers: Not Clinically Stable-   Code Status :  -  Code Status: Full Code   Family Communication:    NA ([name] is alert, awake and coherent)   DVT Prophylaxis  :   - SCDs /Gi Bleed / SCDs Start: 06/11/23 1805   Lab Results  Component Value Date   PLT 108 (L) 06/13/2023   Inpatient Medications  Scheduled Meds:  azithromycin  250 mg Oral Daily   carvedilol  3.125 mg Oral BID WC   Chlorhexidine Gluconate Cloth  6 each Topical Daily   furosemide  40 mg Oral Daily   multivitamin with minerals  1 tablet Oral Daily   oxyCODONE  20 mg Oral BID   pantoprazole (PROTONIX) IV  40 mg Intravenous Q12H   potassium chloride SA  20 mEq Oral Daily   spironolactone  100 mg Oral Daily   Continuous  Infusions:  phytonadione (VITAMIN K) 10 mg in dextrose 5 % 50 mL IVPB 10 mg (06/13/23 1148)   PRN Meds:.fentaNYL (SUBLIMAZE) injection, metoprolol tartrate, ondansetron, polyethylene glycol  Anti-infectives (From admission, onward)    Start     Dose/Rate Route Frequency Ordered Stop   06/12/23 1000  azithromycin (ZITHROMAX) tablet 250 mg        250 mg Oral Daily 06/11/23 1805        Subjective: Lubna Stegeman today has no fevers, no emesis,  No chest pain,    -Had brown stool No abdominal pain -Remains n.p.o. in anticipation of possible EGD today   Objective: Vitals:   06/13/23 0600 06/13/23 0611 06/13/23 0734 06/13/23 0939  BP:   110/66 130/71  Pulse: (!) 57  65   Resp:  16  18  Temp:  98 F (36.7 C)  98 F (36.7 C)  TempSrc:  Oral  Oral  SpO2: 93%     Weight:      Height:        Intake/Output Summary (Last 24 hours) at 06/13/2023 1153 Last data filed at 06/12/2023 1700 Gross per 24 hour  Intake 600 ml  Output --  Net 600 ml   Filed Weights   06/11/23 1301 06/11/23 2000 06/12/23 2213  Weight: 48.5 kg 47.6 kg 47.4 kg   Physical Exam Gen:- Awake Alert,  in no apparent distress  HEENT:- [place].AT, No sclera icterus Neck-Supple Neck,No JVD,.  Lungs-  CTAB , fair symmetrical air movement CV- S1, S2 normal, regular  Abd-  +ve B.Sounds, Abd Soft, No tenderness,    Extremity/Skin:- +ve  edema, pedal pulses present  Psych-affect is appropriate, oriented x3 Neuro-no new focal deficits, no tremors  Data Reviewed: I have personally reviewed following labs and imaging studies  CBC: Recent Labs  Lab 06/11/23 1459 06/12/23 0602 06/13/23 0507  WBC 2.5* 3.8* 4.5  NEUTROABS 1.9  --  2.8  HGB 5.5* 8.5* 7.8*  HCT 18.3* 26.2* 24.7*  MCV 77.5* 81.4 80.5  PLT 100* 99* 108*   Basic Metabolic Panel: Recent Labs  Lab 06/11/23 1459 06/13/23 0507  NA 137 137  K 4.3 3.4*  CL 108 109  CO2 20* 22  GLUCOSE 124* 90  BUN 6* 7*  CREATININE 0.61 0.75  CALCIUM 8.4* 7.7*  PHOS   --  3.7   GFR: Estimated Creatinine Clearance: 53.2 mL/min (by C-G formula based on SCr of 0.75 mg/dL). Liver Function Tests: Recent Labs  Lab 06/11/23 1459 06/13/23 0507  AST 28 44*  ALT 16 17  ALKPHOS 134* 118  BILITOT 0.7 0.7  PROT 6.5 5.8*  ALBUMIN 3.1* 2.6*  2.7*   Recent Results (from the past 240 hours)  Resp panel by RT-PCR (RSV, Flu A&B, Covid) Anterior Nasal Swab     Status: None   Collection Time: 06/11/23  2:13 PM   Specimen: Anterior Nasal Swab  Result Value Ref Range Status   SARS Coronavirus 2 by RT PCR NEGATIVE NEGATIVE Final  Comment: (NOTE) SARS-CoV-2 target nucleic acids are NOT DETECTED.  The SARS-CoV-2 RNA is generally detectable in upper respiratory specimens during the acute phase of infection. The lowest concentration of SARS-CoV-2 viral copies this assay can detect is 138 copies/mL. A negative result does not preclude SARS-Cov-2 infection and should not be used as the sole basis for treatment or other [name] management decisions. A negative result may occur with  improper specimen collection/handling, submission of specimen other than nasopharyngeal swab, presence of viral mutation(s) within the areas targeted by this assay, and inadequate number of viral copies(<138 copies/mL). A negative result must be combined with clinical observations, [name] history, and epidemiological information. The expected result is Negative.  Fact Sheet for Patients:  BloggerCourse.com  Fact Sheet for Healthcare Providers:  SeriousBroker.it  This test is no t yet approved or cleared by the [place] FDA and  has been authorized for detection and/or diagnosis of SARS-CoV-2 by FDA under an Emergency Use Authorization (EUA). This EUA will remain  in effect (meaning this test can be used) for the duration of the COVID-19 declaration under Section 564(b)(1) of the Act, 21 U.S.C.section 360bbb-3(b)(1), unless the  authorization is terminated  or revoked sooner.       Influenza A by PCR NEGATIVE NEGATIVE Final   Influenza B by PCR NEGATIVE NEGATIVE Final    Comment: (NOTE) The Xpert Xpress SARS-CoV-2/FLU/RSV plus assay is intended as an aid in the diagnosis of influenza from Nasopharyngeal swab specimens and should not be used as a sole basis for treatment. Nasal washings and aspirates are unacceptable for Xpert Xpress SARS-CoV-2/FLU/RSV testing.  Fact Sheet for Patients: BloggerCourse.com  Fact Sheet for Healthcare Providers: SeriousBroker.it  This test is not yet approved or cleared by the [place] FDA and has been authorized for detection and/or diagnosis of SARS-CoV-2 by FDA under an Emergency Use Authorization (EUA). This EUA will remain in effect (meaning this test can be used) for the duration of the COVID-19 declaration under Section 564(b)(1) of the Act, 21 U.S.C. section 360bbb-3(b)(1), unless the authorization is terminated or revoked.     Resp Syncytial Virus by PCR NEGATIVE NEGATIVE Final    Comment: (NOTE) Fact Sheet for Patients: BloggerCourse.com  Fact Sheet for Healthcare Providers: SeriousBroker.it  This test is not yet approved or cleared by the [place] FDA and has been authorized for detection and/or diagnosis of SARS-CoV-2 by FDA under an Emergency Use Authorization (EUA). This EUA will remain in effect (meaning this test can be used) for the duration of the COVID-19 declaration under Section 564(b)(1) of the Act, 21 U.S.C. section 360bbb-3(b)(1), unless the authorization is terminated or revoked.  Performed at [facility], [address] [place], [place] [address]   MRSA Next Gen by PCR, Nasal     Status: None   Collection Time: 06/11/23  7:58 PM   Specimen: Nasal Mucosa; Nasal Swab  Result Value Ref Range Status   MRSA by PCR Next Gen NOT  DETECTED NOT DETECTED Final    Comment: (NOTE) The GeneXpert MRSA Assay (FDA approved for NASAL specimens only), is one component of a comprehensive MRSA colonization surveillance program. It is not intended to diagnose MRSA infection nor to guide or monitor treatment for MRSA infections. Test performance is not FDA approved in patients less than 52 years old. Performed at [facility], [address] [place], [place] [address]    Radiology Studies: DG Chest Portable 1 View Result Date: 06/11/2023 CLINICAL DATA:  Cough EXAM: PORTABLE CHEST 1  VIEW COMPARISON:  Chest radiograph dated 08/12/2007 FINDINGS: Normal lung volumes. Hazy lateral right upper lung opacity. No pleural effusion or pneumothorax. The heart size and mediastinal contours are within normal limits. No acute osseous abnormality. IMPRESSION: Hazy lateral right upper lung opacity, which may reflect infection in the appropriate clinical setting. Electronically Signed   By: Agustin Cree M.D.   On: 06/11/2023 15:03   Scheduled Meds:  azithromycin  250 mg Oral Daily   carvedilol  3.125 mg Oral BID WC   Chlorhexidine Gluconate Cloth  6 each Topical Daily   furosemide  40 mg Oral Daily   multivitamin with minerals  1 tablet Oral Daily   oxyCODONE  20 mg Oral BID   pantoprazole (PROTONIX) IV  40 mg Intravenous Q12H   potassium chloride SA  20 mEq Oral Daily   spironolactone  100 mg Oral Daily   Continuous Infusions:  phytonadione (VITAMIN K) 10 mg in dextrose 5 % 50 mL IVPB 10 mg (06/13/23 1148)    LOS: 1 day   Shon Hale M.D on 06/13/2023 at 11:53 AM  Go to [email or web address] - for contact info  [facility] - Office  [phone]  If 7PM-7AM, please contact night-coverage [email or web address] 06/13/2023, 11:53 AM

## 2023-06-13 NOTE — Plan of Care (Signed)
   Problem: Clinical Measurements: Goal: Diagnostic test results will improve Outcome: Progressing

## 2023-06-14 DIAGNOSIS — D61818 Other pancytopenia: Secondary | ICD-10-CM | POA: Diagnosis not present

## 2023-06-14 LAB — PROTIME-INR
INR: 1.5 — ABNORMAL HIGH (ref 0.8–1.2)
Prothrombin Time: 18.7 s — ABNORMAL HIGH (ref 11.4–15.2)

## 2023-06-14 LAB — HEMOGLOBIN AND HEMATOCRIT, BLOOD
HCT: 27 % — ABNORMAL LOW (ref 36.0–46.0)
Hemoglobin: 8.4 g/dL — ABNORMAL LOW (ref 12.0–15.0)

## 2023-06-14 MED ORDER — SPIRONOLACTONE 100 MG PO TABS: 50.0000 mg | ORAL_TABLET | Freq: Every day | ORAL | Status: DC

## 2023-06-14 MED ORDER — FUROSEMIDE 40 MG PO TABS: 20.0000 mg | ORAL_TABLET | Freq: Every day | ORAL | Status: DC

## 2023-06-14 MED ORDER — PANTOPRAZOLE SODIUM 40 MG PO TBEC: 40.0000 mg | DELAYED_RELEASE_TABLET | Freq: Every day | ORAL | 3 refills | Status: DC

## 2023-06-14 MED ORDER — ORAL CARE MOUTH RINSE: 15.0000 mL | OROMUCOSAL | Status: DC | PRN

## 2023-06-14 NOTE — Discharge Summary (Signed)
 [name], is a 63 y.o. female  DOB Sep 18, 1960  MRN [id number].  Admission date:  06/11/2023  Admitting Physician  Shon Hale, MD  Discharge Date:  06/14/2023   Primary MD  Patient, No Pcp Per  Recommendations for primary care physician for things to follow:  1)Avoid ibuprofen/Advil/Aleve/Motrin/Goody Powders/Naproxen/BC powders/Meloxicam/Diclofenac/Indomethacin and other Nonsteroidal anti-inflammatory medications as these will make you more likely to bleed and can cause stomach ulcers, can also cause Kidney problems.   2)Please Follow up with Gastroenterologist Dr. Levon Hedger-- address [address], [place] [address],,OVFIE Number [phone]    3)Please follow-up with Hematologist Dr. Anders Simmonds on 06/17/23 @1100 --- for repeat CBC/complete blood count and CMP/complete metabolic profile tests on 06/17/2023, you will probably need iron infusions from time to time --Address: inside [facility] (4th Floor), [address] [place], [place], [place] [address] Phone: [phone] 4) your furosemide/Lasix and Aldactone has been adjusted to avoid low blood pressure  Admission Diagnosis  Pancytopenia (HCC) [D61.818] Anemia, unspecified type [D64.9] Acute on chronic blood loss anemia [D62]   Discharge Diagnosis  Pancytopenia (HCC) [D61.818] Anemia, unspecified type [D64.9] Acute on chronic blood loss anemia [D62]    Principal Problem:   Pancytopenia (HCC) Active Problems:   LOW BACK PAIN   History of rectal bleeding   Secondary esophageal varices without bleeding (HCC)   Crohn's disease involving terminal ileum (HCC)   Alkaline phosphatase elevation   Acute on chronic blood loss anemia      Past Medical History:  Diagnosis Date   Anorexia 10/01/2010   Arthritis    Chronic pain syndrome    bones and stomach   COPD (chronic obstructive pulmonary disease) (HCC)    Crohn's 10/01/2010    Diarrhea 10/01/2010   Emesis 10/01/2010   GERD (gastroesophageal reflux disease)    Lower abdominal pain 10/01/2010   Pancreatitis    Rectal bleeding 10/01/2010   Seasonal allergies     Past Surgical History:  Procedure Laterality Date    Partial Hysterectomy  about 20 years ago   BIOPSY  06/23/2021   Procedure: BIOPSY;  Surgeon: Dolores Frame, MD;  [facility];  Service: Gastroenterology;;   BIOPSY  03/08/2023   Procedure: BIOPSY;  Surgeon: Dolores Frame, MD;  [facility];  Service: Gastroenterology;;   CHOLECYSTECTOMY     COLECTOMY  1995   x2   COLONOSCOPY  01/17/2006   October 2007 revealinga ileal ulcers proximal to anastomosis without stricture and a single rectal ulcer.   COLONOSCOPY WITH PROPOFOL N/A 04/02/2016   Procedure: COLONOSCOPY WITH PROPOFOL;  Surgeon: Malissa Hippo, MD;  [facility];  Service: Endoscopy;  Laterality: N/A;  1:00   COLONOSCOPY WITH PROPOFOL N/A 06/23/2021   Procedure: COLONOSCOPY WITH PROPOFOL;  Surgeon: Dolores Frame, MD;  [facility];  Service: Gastroenterology;  Laterality: N/A;  805 ASA 1   COLONOSCOPY WITH PROPOFOL N/A 03/08/2023   Procedure: COLONOSCOPY WITH PROPOFOL;  Surgeon: Dolores Frame, MD;  [facility];  Service: Gastroenterology;  Laterality: N/A;  1:45PM;ASA 3   ESOPHAGOGASTRODUODENOSCOPY (EGD) WITH PROPOFOL N/A 11/21/2013   Procedure: ESOPHAGOGASTRODUODENOSCOPY (EGD) WITH PROPOFOL;  Surgeon: Malissa Hippo, MD;  Location: [facility];  Service: Endoscopy;  Laterality: N/A;   ESOPHAGOGASTRODUODENOSCOPY (EGD) WITH PROPOFOL N/A 03/08/2023   Procedure: ESOPHAGOGASTRODUODENOSCOPY (EGD) WITH PROPOFOL;  Surgeon: Dolores Frame, MD;  [facility];  Service: Gastroenterology;  Laterality: N/A;   HERNIA REPAIR     TOOTH EXTRACTION Left 10/17/2013   left lower molar    HPI  from the history and physical done on the day of  admission:   HPI: [name] is a 63 y.o. female with medical history significant of  some type of inflammatory arthritis for which she has chronic opioid use.  She has a history of chronic pancreatitis, Crohn's disease which is currently being treated with Cristy Folks which was changed from Humira.  Patient has a history of a colectomy without colostomy and had recent rectal bleeding about 3 to 4 weeks ago.  That has since stopped.  Her last GI evaluation with EGD and colonoscopy were in November 2024.  Patient also has a history of cirrhosis of the liver thought to be related to Imuran use for treatment of her Crohn's disease.  On EGD she did have grade 1 esophageal varices.  Her last colonoscopy had a few diverticula.  Patient notes her stools have returned to normal and she has no other bleeding.  She was guaiac negative in the ED.  The patient reports that her pain management doctor drew labs for her on yesterday and called her and told her to come in because her hemoglobin was approximately 6.  On eval in the ED today she has noted to have a hemoglobin of 5.5 WBC of 2.5 and a platelet count of 100.  Looking back appear she has low platelets for a while.  She also has a recent viral illness over the last 3 weeks.  She recently was put on a Z-Pak to help treat this.  Patient was noted to have an elevated alkaline phosphatase on labs today as well.  Patient denies having a primary care provider. Review of Systems: As mentioned in the history of present illness. All other systems reviewed and are negative.   Hospital Course:    -Brief summary - 63 year old with past medical history relevant for chronic pancreatitis, inflammatory arthritis for which she has chronic opioid use.  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 and had recent rectal bleeding in January 2025,  Her last GI evaluation with EGD and colonoscopy were  in November 2024 (EGD she did have grade 1 esophageal varices, colonoscopy showed diverticulosis), Patient also has a history of Liver cirrhosis of the liver thought to be related to Imuran use for treatment of her Crohn's disease-admitted on 06/11/2023 with pancytopenia and acute on chronic anemia with hemoglobin of 5.5, she was Hemoccult negative   -Assessment and Plan: 1)Acute on Chronic Symptomatic Anemia--patient with chronic anemia in the setting of Liver cirrhosis suspect superimposed acute GI blood loss in the setting of esophageal varices and thrombocytopenia--patient reported episode of rectal bleeding in late January 2025 -Hgb was down to 5.5 on admission Hgb 5.5>>8.5 >>7.8 >>8.4 -Hemoccult negative -last GI evaluation with EGD and colonoscopy were in November 2024 (EGD she did have grade 1 esophageal varices, colonoscopy showed diverticulosis) -B12 and folate are Not  low -Serum iron , ferritin and iron saturation are all low---consistent with iron deficiency anemia--suspect ongoing blood loss -GI consult appreciated recommends outpatient GI follow-up for possible endoluminal evaluation down the road since Hgb stable at this time -Also recommend follow-up with hematologist for iron infusions as outpatient   2)Pancytopenia --suspect related to underlying Liver cirrhosis -Platelets trending up -now at 108 k, Not too far from prior baseline -WBC has normalized -Hgb as above #1 -Patient has outpatient appointment with hematologist Dr. Anders Simmonds on 06/17/23 @1100 --she will need repeat CBC and possible iron infusion at that time   3)Alkaline phosphatase elevation--- Unclear etiology likely related to her chronic liver disease, though all of her other liver markers appear normal including T. bili. -GGT is mildly elevated at 69 -Nucleotidase 5 pending -- Patient with history of Imuran induced liver cirrhosis -Repeat CMP with hematologist on 06/17/2023   5)Crohn's disease involving terminal  ileum [facility]) currently being treated with Cristy Folks which was changed from Humira -Patient reports episode of rectal bleeding in late January 2025 -GI consult appreciated -Outpatient follow-up advised   6)LOW BACK PAIN/inflammatory arthritis Continue home pain meds -Avoid NSAIDs--given acute on chronic anemia   7) history of Imuran induced Liver cirrhosis--with esophageal varices and concerns for portal hypertension -LFTs noted,  INR down to 1.5 from 3.4--after vitamin K -- Continue Coreg,  -Decrease Lasix to 20 mg daily, and Aldactone 50 mg daily due to soft BP  Disposition: The patient is from: Home              Anticipated d/c is to: Home  Discharge Condition: stable  Follow UP   Follow-up Information     Dolores Frame, MD. Schedule an appointment as soon as possible for a visit on 06/20/2023.   Specialty: Gastroenterology Contact information: [address] [place] [address] [phone]         Cindie Crumbly, MD. Schedule an appointment as soon as possible for a visit on 06/17/2023.   Specialty: Oncology Why: Repeat CBC and CMP---on 06/17/23 Contact information: [address] [place] [address] [phone]                 Consults obtained -GI  Diet and Activity recommendation:  As advised  Discharge Instructions    Discharge Instructions     Call MD for:  difficulty breathing, headache or visual disturbances   Complete by: As directed    Call MD for:  persistant dizziness or light-headedness   Complete by: As directed    Call MD for:  persistant nausea and vomiting   Complete by: As directed    Call MD for:  temperature >100.4   Complete by: As directed    Diet - low sodium heart healthy   Complete by: As directed    Discharge instructions   Complete by: As directed    1)Avoid ibuprofen/Advil/Aleve/Motrin/Goody Powders/Naproxen/BC powders/Meloxicam/Diclofenac/Indomethacin and other Nonsteroidal  anti-inflammatory medications as these will make you more likely to bleed and can cause stomach ulcers, can also cause Kidney problems.   2)Please Follow up with Gastroenterologist Dr. Levon Hedger-- address [address], [place] [address],,UKGUR Number [phone]    3)Please follow-up with Hematologist Dr. Anders Simmonds on 06/17/23 @1100 --- for repeat CBC/complete blood count and CMP/complete metabolic profile tests on 06/17/2023, you will probably need iron infusions from time to time --Address: inside [facility] (4th Floor), [address] [place], [place], [place] [address] Phone: [phone] 4) your furosemide/Lasix and Aldactone  has been adjusted to avoid low blood pressure   Increase activity slowly   Complete by: As directed          Discharge Medications     Allergies as of 06/14/2023   No Known Allergies      Medication List     STOP taking these medications    azithromycin 250 MG tablet Commonly known as: ZITHROMAX   potassium chloride SA 20 MEQ tablet Commonly known as: KLOR-CON M   predniSONE 5 MG (21) Tbpk tablet Commonly known as: STERAPRED UNI-PAK 21 TAB       TAKE these medications    alendronate 70 MG tablet Commonly known as: FOSAMAX Take 70 mg by mouth once a week.   carvedilol 3.125 MG tablet Commonly known as: Coreg Take 1 tablet (3.125 mg total) by mouth 2 (two) times daily with a meal.   famotidine 20 MG tablet Commonly known as: PEPCID Take 20 mg by mouth daily.   furosemide 40 MG tablet Commonly known as: Lasix Take 0.5 tablets (20 mg total) by mouth daily. What changed: how much to take   multivitamin with minerals Tabs tablet Take 1 tablet by mouth daily.   ondansetron 4 MG tablet Commonly known as: ZOFRAN Take 1 tablet (4 mg total) by mouth every 8 (eight) hours as needed for nausea or vomiting.   OxyCONTIN 20 MG 12 hr tablet Generic drug: oxyCODONE Take 20 mg by mouth 2 (two) times daily.   pantoprazole 40 MG  tablet Commonly known as: PROTONIX Take 1 tablet (40 mg total) by mouth daily.   QC TUMERIC COMPLEX PO Take by mouth daily at 6 (six) AM.   [name] 360 MG/2.4ML Soct Generic drug: Risankizumab-rzaa by Intravenous (Continuous Infusion) route every 3 (three) months.   spironolactone 100 MG tablet Commonly known as: Aldactone Take 0.5 tablets (50 mg total) by mouth daily. What changed: how much to take   Vitamin D (Ergocalciferol) 1.25 MG (50000 UNIT) Caps capsule Commonly known as: DRISDOL Take 50,000 Units by mouth once a week.        Major procedures and Radiology Reports - PLEASE review detailed and final reports for all details, in brief -   DG Chest Portable 1 View Result Date: 06/11/2023 CLINICAL DATA:  Cough EXAM: PORTABLE CHEST 1 VIEW COMPARISON:  Chest radiograph dated 08/12/2007 FINDINGS: Normal lung volumes. Hazy lateral right upper lung opacity. No pleural effusion or pneumothorax. The heart size and mediastinal contours are within normal limits. No acute osseous abnormality. IMPRESSION: Hazy lateral right upper lung opacity, which may reflect infection in the appropriate clinical setting. Electronically Signed   By: Agustin Cree M.D.   On: 06/11/2023 15:03   MR ABDOMEN WWO CONTRAST Result Date: 05/29/2023 CLINICAL DATA:  Common bile duct dilatation, cirrhosis EXAM: MRI ABDOMEN WITHOUT AND WITH CONTRAST TECHNIQUE: Multiplanar multisequence MR imaging of the abdomen was performed both before and after the administration of intravenous contrast. CONTRAST:  7mL GADAVIST GADOBUTROL 1 MMOL/ML IV SOLN COMPARISON:  Right upper quadrant ultrasound, 02/24/2023, MR abdomen, 11/30/2021 FINDINGS: Lower chest: No acute abnormality. Hepatobiliary: No focal liver abnormality is seen. Status post cholecystectomy. Unchanged intra and extrahepatic biliary ductal dilatation, common hepatic duct measuring up to 1.4 cm in caliber (series 4, image 17). Pancreas: Diffusely atrophic pancreas. No  pancreatic ductal dilatation or surrounding inflammatory changes. Spleen: Normal in size without significant abnormality. Adrenals/Urinary Tract: Adrenal glands are unremarkable. Kidneys are normal, without renal calculi, solid lesion, or hydronephrosis. Stomach/Bowel: Stomach is within  normal limits. No evidence of bowel wall thickening, distention, or inflammatory changes. Vascular/Lymphatic: Aortic atherosclerosis. No enlarged abdominal lymph nodes. Other: No abdominal wall hernia or abnormality. No ascites. Musculoskeletal: No acute or significant osseous findings. IMPRESSION: 1. Status post cholecystectomy. Unchanged intra and extrahepatic biliary ductal dilatation, common hepatic duct measuring up to 1.4 cm in caliber. No obstructing mass or stone. Findings are most consistent with benign post cholecystectomy ductal dilatation 2. No focal liver abnormality. No morphologic changes of cirrhosis or suspicious liver lesion. 3. Diffuse pancreatic atrophy, in keeping with known history of chronic pancreatitis. No acute inflammatory findings. Aortic Atherosclerosis (ICD10-I70.0). Electronically Signed   By: Jearld Lesch M.D.   On: 05/29/2023 14:25    Micro Results  Recent Results (from the past 240 hours)  Resp panel by RT-PCR (RSV, Flu A&B, Covid) Anterior Nasal Swab     Status: None   Collection Time: 06/11/23  2:13 PM   Specimen: Anterior Nasal Swab  Result Value Ref Range Status   SARS Coronavirus 2 by RT PCR NEGATIVE NEGATIVE Final    Comment: (NOTE) SARS-CoV-2 target nucleic acids are NOT DETECTED.  The SARS-CoV-2 RNA is generally detectable in upper respiratory specimens during the acute phase of infection. The lowest concentration of SARS-CoV-2 viral copies this assay can detect is 138 copies/mL. A negative result does not preclude SARS-Cov-2 infection and should not be used as the sole basis for treatment or other patient management decisions. A negative result may occur with  improper  specimen collection/handling, submission of specimen other than nasopharyngeal swab, presence of viral mutation(s) within the areas targeted by this assay, and inadequate number of viral copies(<138 copies/mL). A negative result must be combined with clinical observations, patient history, and epidemiological information. The expected result is Negative.  Fact Sheet for Patients:  BloggerCourse.com  Fact Sheet for Healthcare Providers:  SeriousBroker.it  This test is no t yet approved or cleared by the [place] FDA and  has been authorized for detection and/or diagnosis of SARS-CoV-2 by FDA under an Emergency Use Authorization (EUA). This EUA will remain  in effect (meaning this test can be used) for the duration of the COVID-19 declaration under Section 564(b)(1) of the Act, 21 U.S.C.section 360bbb-3(b)(1), unless the authorization is terminated  or revoked sooner.       Influenza A by PCR NEGATIVE NEGATIVE Final   Influenza B by PCR NEGATIVE NEGATIVE Final    Comment: (NOTE) The Xpert Xpress SARS-CoV-2/FLU/RSV plus assay is intended as an aid in the diagnosis of influenza from Nasopharyngeal swab specimens and should not be used as a sole basis for treatment. Nasal washings and aspirates are unacceptable for Xpert Xpress SARS-CoV-2/FLU/RSV testing.  Fact Sheet for Patients: BloggerCourse.com  Fact Sheet for Healthcare Providers: SeriousBroker.it  This test is not yet approved or cleared by the [place] FDA and has been authorized for detection and/or diagnosis of SARS-CoV-2 by FDA under an Emergency Use Authorization (EUA). This EUA will remain in effect (meaning this test can be used) for the duration of the COVID-19 declaration under Section 564(b)(1) of the Act, 21 U.S.C. section 360bbb-3(b)(1), unless the authorization is terminated or revoked.     Resp  Syncytial Virus by PCR NEGATIVE NEGATIVE Final    Comment: (NOTE) Fact Sheet for Patients: BloggerCourse.com  Fact Sheet for Healthcare Providers: SeriousBroker.it  This test is not yet approved or cleared by the [place] FDA and has been authorized for detection and/or diagnosis of SARS-CoV-2 by FDA under an Emergency  Use Authorization (EUA). This EUA will remain in effect (meaning this test can be used) for the duration of the COVID-19 declaration under Section 564(b)(1) of the Act, 21 U.S.C. section 360bbb-3(b)(1), unless the authorization is terminated or revoked.  Performed at [facility], [address], [place], [place] [address]   MRSA Next Gen by PCR, Nasal     Status: None   Collection Time: 06/11/23  7:58 PM   Specimen: Nasal Mucosa; Nasal Swab  Result Value Ref Range Status   MRSA by PCR Next Gen NOT DETECTED NOT DETECTED Final    Comment: (NOTE) The GeneXpert MRSA Assay (FDA approved for NASAL specimens only), is one component of a comprehensive MRSA colonization surveillance program. It is not intended to diagnose MRSA infection nor to guide or monitor treatment for MRSA infections. Test performance is not FDA approved in patients less than 34 years old. Performed at [facility], [address] [place], [place] [address]     Today   Subjective    Yancy Hascall today has nn New complaints  No fever  Or chills  -Eating and drinking well, had brown stool - No Nausea, Vomiting or Diarrhea -- No abdominal pain -Ambulating without dizziness palpitations or dyspnea on exertion   Patient has been seen and examined prior to discharge   Objective   Blood pressure (!) 99/56, pulse 72, temperature 98.4 F (36.9 C), temperature source Oral, resp. rate 18, height 5\' 2"  (1.575 m), weight 47.4 kg, SpO2 95%.   Intake/Output Summary (Last 24 hours) at 06/14/2023 1124 Last data filed at 06/14/2023 0400 Gross per 24  hour  Intake 740 ml  Output --  Net 740 ml    Exam Gen:- Awake Alert, no acute distress  HEENT:- [place].AT, No sclera icterus Neck-Supple Neck,No JVD,.  Lungs-  CTAB , good air movement bilaterally CV- S1, S2 normal, regular Abd-  +ve B.Sounds, Abd Soft, No tenderness,    Extremity/Skin:- No  edema,   good pulses Psych-affect is appropriate, oriented x3 Neuro-no new focal deficits, no tremors    Data Review   CBC w Diff:  Lab Results  Component Value Date   WBC 4.5 06/13/2023   HGB 8.4 (L) 06/14/2023   HCT 27.0 (L) 06/14/2023   PLT 108 (L) 06/13/2023   LYMPHOPCT 22 06/13/2023   MONOPCT 13 06/13/2023   EOSPCT 3 06/13/2023   BASOPCT 1 06/13/2023   CMP:  Lab Results  Component Value Date   NA 137 06/13/2023   K 3.4 (L) 06/13/2023   CL 109 06/13/2023   CO2 22 06/13/2023   BUN 7 (L) 06/13/2023   BUN 6 (L) 03/25/2020   CREATININE 0.75 06/13/2023   CREATININE 0.70 03/10/2023   PROT 5.8 (L) 06/13/2023   ALBUMIN 2.7 (L) 06/13/2023   ALBUMIN 2.6 (L) 06/13/2023   BILITOT 0.7 06/13/2023   ALKPHOS 118 06/13/2023   AST 44 (H) 06/13/2023   ALT 17 06/13/2023  .  Total Discharge time is about 33 minutes  Shon Hale M.D on 06/14/2023 at 11:24 AM  Go to [email or web address] -  for contact info  [facility] - Office  [phone]

## 2023-06-14 NOTE — Discharge Instructions (Signed)
 1)Avoid ibuprofen/Advil/Aleve/Motrin/Goody Powders/Naproxen/BC powders/Meloxicam/Diclofenac/Indomethacin and other Nonsteroidal anti-inflammatory medications as these will make you more likely to bleed and can cause stomach ulcers, can also cause Kidney problems.   2)Please Follow up with Gastroenterologist Dr. Levon Hedger-- address [address], [place] [address],,VHQIO Number [phone]    3)Please follow-up with Hematologist Dr. Anders Simmonds on 06/17/23 @1100 --- for repeat CBC/complete blood count and CMP/complete metabolic profile tests on 06/17/2023, you will probably need iron infusions from time to time --Address: inside [facility] (4th Floor), [address] [place], [place], [place] [address] Phone: [phone] 4) your furosemide/Lasix and Aldactone has been adjusted to avoid low blood pressure

## 2023-06-15 LAB — NUCLEOTIDASE, 5', BLOOD: 5-Nucleotidase: 11 [IU]/L (ref 0–11)

## 2023-06-17 ENCOUNTER — Encounter: Payer: Self-pay | Admitting: Oncology

## 2023-06-17 ENCOUNTER — Inpatient Hospital Stay: Payer: 59 | Attending: Oncology | Admitting: Oncology

## 2023-06-17 ENCOUNTER — Inpatient Hospital Stay: Payer: 59

## 2023-06-17 VITALS — BP 100/60 | HR 65 | Temp 98.1°F | Resp 16 | Wt 100.8 lb

## 2023-06-17 DIAGNOSIS — D696 Thrombocytopenia, unspecified: Secondary | ICD-10-CM | POA: Diagnosis present

## 2023-06-17 DIAGNOSIS — Z79899 Other long term (current) drug therapy: Secondary | ICD-10-CM | POA: Diagnosis not present

## 2023-06-17 DIAGNOSIS — D509 Iron deficiency anemia, unspecified: Secondary | ICD-10-CM | POA: Insufficient documentation

## 2023-06-17 DIAGNOSIS — D500 Iron deficiency anemia secondary to blood loss (chronic): Secondary | ICD-10-CM | POA: Insufficient documentation

## 2023-06-17 DIAGNOSIS — K50011 Crohn's disease of small intestine with rectal bleeding: Secondary | ICD-10-CM | POA: Diagnosis present

## 2023-06-17 DIAGNOSIS — K50019 Crohn's disease of small intestine with unspecified complications: Secondary | ICD-10-CM

## 2023-06-17 NOTE — Assessment & Plan Note (Signed)
 Patient reports change in symptoms (blood in stool, loss of appetite) since starting [name]. Previously well-managed on Humira. -Recommended follow-up with gastroenterologist (Dr. Levon Hedger) to discuss medication side effects and potential alternatives.

## 2023-06-17 NOTE — Assessment & Plan Note (Signed)
 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  No signs of acute bleeding at this time.  Patient's counts are at baseline. -Continue to monitor counts

## 2023-06-17 NOTE — Progress Notes (Signed)
 [facility] at [facility] HEMATOLOGY NEW VISIT  Patient, No Pcp Per  REASON FOR REFERRAL: Iron deficiency anemia  HISTORY OF PRESENT ILLNESS: [name] 63 y.o. female referred for iron deficiency anemia.  She has a past medical history of Crohn's disease on [name], cirrhosis likely secondary to 6-mercaptopurine and fatty liver disease, chronic pancreatitis.  She was seen outpatient and had blood work which showed low hemoglobin and was sent to the ER for the same.  She was then admitted to the hospital last week and had packed red blood cells transfused.  GI was consulted during this time and was thought that her Crohn's disease is contributing to slow bleed.  Patient reports severe fatigue, chronic joint pain that she manages with pain medication, loss of appetite after starting [name] and some weight loss.  She denies blood in stools, melena.  She has no other complaints.  Denies shortness of breath, dyspnea on exertion and dizziness.  Patient denies smoking, denies alcohol use.  Lives in [place].  Last colonoscopy and endoscopy were in November 2024.  Colonoscopy showed Crohn's disease with ileitis and diverticulosis.  Endoscopy showed grade 1 esophageal varices, nonbleeding.   I have reviewed the past medical history, past surgical history, social history and family history with the patient   ALLERGIES:  has no known allergies.  MEDICATIONS:  Current Outpatient Medications  Medication Sig Dispense Refill   alendronate (FOSAMAX) 70 MG tablet Take 70 mg by mouth once a week.     carvedilol (COREG) 3.125 MG tablet Take 1 tablet (3.125 mg total) by mouth 2 (two) times daily with a meal. 180 tablet 1   famotidine (PEPCID) 20 MG tablet Take 20 mg by mouth daily.     furosemide (LASIX) 40 MG tablet Take 0.5 tablets (20 mg total) by mouth daily.     Multiple Vitamin (MULTIVITAMIN WITH MINERALS) TABS tablet Take 1 tablet by mouth daily.     ondansetron (ZOFRAN)  4 MG tablet Take 1 tablet (4 mg total) by mouth every 8 (eight) hours as needed for nausea or vomiting. 90 tablet 3   OXYCONTIN 20 MG 12 hr tablet Take 20 mg by mouth 2 (two) times daily.     pantoprazole (PROTONIX) 40 MG tablet Take 1 tablet (40 mg total) by mouth daily. 30 tablet 3   [name] 360 MG/2.4ML SOCT by Intravenous (Continuous Infusion) route every 3 (three) months.     spironolactone (ALDACTONE) 100 MG tablet Take 0.5 tablets (50 mg total) by mouth daily.     Turmeric (QC TUMERIC COMPLEX PO) Take by mouth daily at 6 (six) AM.     Vitamin D, Ergocalciferol, (DRISDOL) 1.25 MG (50000 UNIT) CAPS capsule Take 50,000 Units by mouth once a week.     No current facility-administered medications for this visit.     REVIEW OF SYSTEMS:   Constitutional: Denies fevers, chills or night sweats Eyes: Denies blurriness of vision Ears, nose, mouth, throat, and face: Denies mucositis or sore throat Respiratory: Denies cough, dyspnea or wheezes Cardiovascular: Denies palpitation, chest discomfort or lower extremity swelling Gastrointestinal:  Denies nausea, heartburn or change in bowel habits Skin: Denies abnormal skin rashes Lymphatics: Denies new lymphadenopathy or easy bruising Neurological:Denies numbness, tingling or new weaknesses Behavioral/Psych: Mood is stable, no new changes  All other systems were reviewed with the patient and are negative.  PHYSICAL EXAMINATION:   Vitals:   06/17/23 1049  BP: 100/60  Pulse: 65  Resp: 16  Temp:  98.1 F (36.7 C)  SpO2: 99%    GENERAL:alert, no distress and comfortable SKIN: skin color, texture, turgor are normal, no rashes or significant lesions LUNGS: clear to auscultation and percussion with normal breathing effort HEART: regular rate & rhythm and no murmurs and no lower extremity edema ABDOMEN:abdomen soft, non-tender and normal bowel sounds Musculoskeletal:no cyanosis of digits and no clubbing  NEURO: alert & oriented x 3 with fluent  speech  LABORATORY DATA:  I have reviewed the data as listed  Lab Results  Component Value Date   WBC 4.5 06/13/2023   NEUTROABS 2.8 06/13/2023   HGB 8.4 (L) 06/14/2023   HCT 27.0 (L) 06/14/2023   MCV 80.5 06/13/2023   PLT 108 (L) 06/13/2023      Component Value Date/Time   NA 137 06/13/2023 0507   K 3.4 (L) 06/13/2023 0507   CL 109 06/13/2023 0507   CO2 22 06/13/2023 0507   GLUCOSE 90 06/13/2023 0507   BUN 7 (L) 06/13/2023 0507   BUN 6 (L) 03/25/2020 1723   CREATININE 0.75 06/13/2023 0507   CREATININE 0.70 03/10/2023 1439   CALCIUM 7.7 (L) 06/13/2023 0507   PROT 5.8 (L) 06/13/2023 0507   ALBUMIN 2.7 (L) 06/13/2023 0507   ALBUMIN 2.6 (L) 06/13/2023 0507   AST 44 (H) 06/13/2023 0507   ALT 17 06/13/2023 0507   ALKPHOS 118 06/13/2023 0507   BILITOT 0.7 06/13/2023 0507   GFRNONAA >60 06/13/2023 0507   GFRNONAA 81 02/11/2020 1136   GFRAA 105 03/25/2020 1723   GFRAA 94 02/11/2020 1136       Chemistry      Component Value Date/Time   NA 137 06/13/2023 0507   K 3.4 (L) 06/13/2023 0507   CL 109 06/13/2023 0507   CO2 22 06/13/2023 0507   BUN 7 (L) 06/13/2023 0507   BUN 6 (L) 03/25/2020 1723   CREATININE 0.75 06/13/2023 0507   CREATININE 0.70 03/10/2023 1439      Component Value Date/Time   CALCIUM 7.7 (L) 06/13/2023 0507   ALKPHOS 118 06/13/2023 0507   AST 44 (H) 06/13/2023 0507   ALT 17 06/13/2023 0507   BILITOT 0.7 06/13/2023 0507      Latest Reference Range & Units 06/11/23 14:42  Iron 28 - 170 ug/dL 24 (L)  UIBC ug/dL 562  TIBC 130 - 865 ug/dL 784  Saturation Ratios 10.4 - 31.8 % 6 (L)  Ferritin 11 - 307 ng/mL 7 (L)  Folate >5.9 ng/mL 33.8  (L): Data is abnormally low   Latest Reference Range & Units 06/11/23 14:42  Vitamin B12 180 - 914 pg/mL 537     ASSESSMENT & PLAN:  Patient is a 63 year old female with Crohn's disease and liver cirrhosis referred for iron deficiency anemia   Iron deficiency anemia due to chronic blood loss Low ferritin  level (7) likely secondary to chronic blood loss from Crohn's disease. Patient has been non-compliant with oral iron supplementation.  Last colonoscopy and endoscopy did not show acute bleed. -The most likely cause of her anemia is due to chronic blood loss.We discussed some of the risks, benefits, and alternatives of intravenous iron infusions. The patient is symptomatic from anemia and the iron level is critically low. She tolerated oral iron supplement poorly and desires to achieved higher levels of iron faster for adequate hematopoesis. Some of the side-effects to be expected including risks of infusion reactions, phlebitis, headaches, nausea and fatigue.  The patient is willing to proceed. Patient education material was  dispensed. Goal is to keep ferritin level greater than 50 and resolution of anemia -Start taking oral iron supplementation every other day. -Continue to follow with GI -Recheck blood work in six weeks to assess response to IV iron.  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  No signs of acute bleeding at this time.  Patient's counts are at baseline. -Continue to monitor counts  Inflammatory bowel disease (Crohn's disease) (HCC) Patient reports change in symptoms (blood in stool, loss of appetite) since starting [name]. Previously well-managed on Humira. -Recommended follow-up with gastroenterologist (Dr. Levon Hedger) to discuss medication side effects and potential alternatives.   Orders Placed This Encounter  Procedures   Ferritin    Standing Status:   Future    Expected Date:   07/25/2023    Expiration Date:   06/16/2024   Folate    Standing Status:   Future    Expected Date:   07/25/2023    Expiration Date:   06/16/2024   Vitamin B12    Standing Status:   Future    Expected Date:   07/25/2023    Expiration Date:   06/16/2024   CBC with Differential/Platelet    Standing Status:   Future    Expected Date:   07/25/2023     Expiration Date:   06/16/2024   Comprehensive metabolic panel    Standing Status:   Future    Expected Date:   07/25/2023    Expiration Date:   06/16/2024   Iron and TIBC    Standing Status:   Future    Expected Date:   07/25/2023    Expiration Date:   06/16/2024    The total time spent in the appointment was 40 minutes encounter with patients including review of chart and various tests results, discussions about plan of care and coordination of care plan   All questions were answered. The patient knows to call the clinic with any problems, questions or concerns. No barriers to learning was detected.   Cindie Crumbly, MD 2/28/202512:44 PM

## 2023-06-17 NOTE — Assessment & Plan Note (Signed)
 Low ferritin level (7) likely secondary to chronic blood loss from Crohn's disease. Patient has been non-compliant with oral iron supplementation.  Last colonoscopy and endoscopy did not show acute bleed. -The most likely cause of her anemia is due to chronic blood loss.We discussed some of the risks, benefits, and alternatives of intravenous iron infusions. The patient is symptomatic from anemia and the iron level is critically low. She tolerated oral iron supplement poorly and desires to achieved higher levels of iron faster for adequate hematopoesis. Some of the side-effects to be expected including risks of infusion reactions, phlebitis, headaches, nausea and fatigue.  The patient is willing to proceed. Patient education material was dispensed. Goal is to keep ferritin level greater than 50 and resolution of anemia -Start taking oral iron supplementation every other day. -Continue to follow with GI -Recheck blood work in six weeks to assess response to IV iron.

## 2023-06-17 NOTE — Patient Instructions (Signed)
 VISIT SUMMARY:  During today's visit, we discussed your ongoing symptoms and concerns, including fatigue, hair loss, and passing blood, which began after switching from Humira to [facility] for your Crohn's disease. We also reviewed your chronic joint pain, dry skin, and low iron levels. Your history of cirrhosis was noted, and we discussed the need for a primary care provider.  YOUR PLAN:  -IRON DEFICIENCY ANEMIA: Iron deficiency anemia is a condition where your body lacks enough iron to produce healthy red blood cells, leading to fatigue and weakness. We will administer IV iron in two doses, one week apart, and you should take oral iron pills every other day. We will recheck your blood work in six weeks to see how you are responding to the treatment.  -PRIMARY CARE: Having a primary care provider is important for overall health management. We will provide you with a referral to establish care with a primary care provider.  INSTRUCTIONS:  Please follow up with your gastroenterologist, Dr. Levon Hedger, to discuss your Crohn's disease medication. We will recheck your blood work in six weeks to assess your response to the IV iron treatment. Additionally, we will provide you with a referral for a primary care provider.

## 2023-06-20 ENCOUNTER — Ambulatory Visit (INDEPENDENT_AMBULATORY_CARE_PROVIDER_SITE_OTHER): Payer: 59 | Admitting: Gastroenterology

## 2023-06-23 ENCOUNTER — Inpatient Hospital Stay: Payer: 59 | Attending: Oncology

## 2023-06-24 ENCOUNTER — Inpatient Hospital Stay

## 2023-06-29 ENCOUNTER — Other Ambulatory Visit (HOSPITAL_COMMUNITY): Payer: Self-pay | Admitting: Adult Health

## 2023-06-29 DIAGNOSIS — Z1231 Encounter for screening mammogram for malignant neoplasm of breast: Secondary | ICD-10-CM

## 2023-07-01 ENCOUNTER — Inpatient Hospital Stay: Payer: 59 | Attending: Oncology

## 2023-07-01 VITALS — BP 98/66 | HR 66 | Temp 97.7°F | Resp 18

## 2023-07-01 DIAGNOSIS — D500 Iron deficiency anemia secondary to blood loss (chronic): Secondary | ICD-10-CM | POA: Insufficient documentation

## 2023-07-01 DIAGNOSIS — K50011 Crohn's disease of small intestine with rectal bleeding: Secondary | ICD-10-CM | POA: Insufficient documentation

## 2023-07-01 DIAGNOSIS — D696 Thrombocytopenia, unspecified: Secondary | ICD-10-CM | POA: Insufficient documentation

## 2023-07-01 MED ORDER — SODIUM CHLORIDE 0.9 % IV SOLN
510.0000 mg | Freq: Once | INTRAVENOUS | Status: AC
  Administered 2023-07-01: 510 mg via INTRAVENOUS
  Filled 2023-07-01: qty 510

## 2023-07-01 MED ORDER — CETIRIZINE HCL 10 MG PO TABS
10.0000 mg | ORAL_TABLET | Freq: Once | ORAL | Status: AC
Start: 2023-07-01 — End: 2023-07-01
  Administered 2023-07-01: 10 mg via ORAL
  Filled 2023-07-01: qty 1

## 2023-07-01 MED ORDER — SODIUM CHLORIDE 0.9 % IV SOLN: INTRAVENOUS | Status: DC

## 2023-07-01 MED ORDER — ACETAMINOPHEN 325 MG PO TABS
650.0000 mg | ORAL_TABLET | Freq: Once | ORAL | Status: AC
  Administered 2023-07-01: 650 mg via ORAL
  Filled 2023-07-01: qty 2

## 2023-07-01 NOTE — Patient Instructions (Signed)

## 2023-07-01 NOTE — Progress Notes (Signed)
 Patient tolerated iron infusion with no complaints voiced.  Peripheral IV site clean and dry with good blood return noted before and after infusion.  Band aid applied.  VSS with discharge and left in satisfactory condition with no s/s of distress noted.

## 2023-07-08 ENCOUNTER — Inpatient Hospital Stay

## 2023-07-08 VITALS — BP 97/60 | HR 58 | Temp 97.8°F | Resp 16

## 2023-07-08 DIAGNOSIS — D500 Iron deficiency anemia secondary to blood loss (chronic): Secondary | ICD-10-CM

## 2023-07-08 MED ORDER — SODIUM CHLORIDE 0.9 % IV SOLN
510.0000 mg | Freq: Once | INTRAVENOUS | Status: AC
  Administered 2023-07-08: 510 mg via INTRAVENOUS
  Filled 2023-07-08: qty 510

## 2023-07-08 MED ORDER — ACETAMINOPHEN 325 MG PO TABS
650.0000 mg | ORAL_TABLET | Freq: Once | ORAL | Status: AC
  Administered 2023-07-08: 650 mg via ORAL
  Filled 2023-07-08: qty 2

## 2023-07-08 MED ORDER — SODIUM CHLORIDE 0.9 % IV SOLN
INTRAVENOUS | Status: DC
Start: 2023-07-08 — End: 2023-07-08

## 2023-07-08 MED ORDER — CETIRIZINE HCL 10 MG PO TABS
10.0000 mg | ORAL_TABLET | Freq: Once | ORAL | Status: AC
  Administered 2023-07-08: 10 mg via ORAL
  Filled 2023-07-08: qty 1

## 2023-07-08 NOTE — Progress Notes (Signed)
 Patient presents today for Feraheme infusion. Vital signs stable. Patient denies any side effects related to last treatment.   Feraheme given today per MD orders. Tolerated infusion without adverse affects. Vital signs stable. No complaints at this time. Discharged from clinic ambulatory in stable condition. Alert and oriented x 3. F/U with [facility] as scheduled.

## 2023-07-08 NOTE — Patient Instructions (Signed)
 [facility] [place] - A DEPT OF [facility]  Discharge Instructions: Thank you for choosing [facility] to provide your oncology and hematology care.  If you have a lab appointment with the Cancer Center - please note that after April 8th, 2024, all labs will be drawn in the cancer center.  You do not have to check in or register with the main entrance as you have in the past but will complete your check-in in the cancer center.  Wear comfortable clothing and clothing appropriate for easy access to any Portacath or PICC line.   We strive to give you quality time with your provider. You may need to reschedule your appointment if you arrive late (15 or more minutes).  Arriving late affects you and other patients whose appointments are after yours.  Also, if you miss three or more appointments without notifying the office, you may be dismissed from the clinic at the provider's discretion.      For prescription refill requests, have your pharmacy contact our office and allow 72 hours for refills to be completed.    Today you received the following chemotherapy and/or immunotherapy agents Feraheme. Ferumoxytol Injection What is this medication? FERUMOXYTOL (FER ue MOX i tol) treats low levels of iron in your body (iron deficiency anemia). Iron is a mineral that plays an important role in making red blood cells, which carry oxygen from your lungs to the rest of your body. This medicine may be used for other purposes; ask your health care provider or pharmacist if you have questions. COMMON BRAND NAME(S): Feraheme What should I tell my care team before I take this medication? They need to know if you have any of these conditions: Anemia not caused by low iron levels High levels of iron in the blood Magnetic resonance imaging (MRI) test scheduled An unusual or allergic reaction to iron, other medications, foods, dyes, or preservatives Pregnant or trying to get  pregnant Breastfeeding How should I use this medication? This medication is injected into a vein. It is given by your care team in a hospital or clinic setting. Talk to your care team the use of this medication in children. Special care may be needed. Overdosage: If you think you have taken too much of this medicine contact a poison control center or emergency room at once. NOTE: This medicine is only for you. Do not share this medicine with others. What if I miss a dose? It is important not to miss your dose. Call your care team if you are unable to keep an appointment. What may interact with this medication? Other iron products This list may not describe all possible interactions. Give your health care provider a list of all the medicines, herbs, non-prescription drugs, or dietary supplements you use. Also tell them if you smoke, drink alcohol, or use illegal drugs. Some items may interact with your medicine. What should I watch for while using this medication? Visit your care team for regular checks on your progress. Tell your care team if your symptoms do not start to get better or if they get worse. You may need blood work done while you are taking this medication. You may need to eat more foods that contain iron. Talk to your care team. Foods that contain iron include whole grains or cereals, dried fruits, beans, peas, leafy green vegetables, and organ meats (liver, kidney). What side effects may I notice from receiving this medication? Side effects that  you should report to your care team as soon as possible: Allergic reactions--skin rash, itching, hives, swelling of the face, lips, tongue, or throat Low blood pressure--dizziness, feeling faint or lightheaded, blurry vision Shortness of breath Side effects that usually do not require medical attention (report to your care team if they continue or are bothersome): Flushing Headache Joint pain Muscle pain Nausea Pain, redness, or  irritation at injection site This list may not describe all possible side effects. Call your doctor for medical advice about side effects. You may report side effects to FDA at 1-800-FDA-1088. Where should I keep my medication? This medication is given in a hospital or clinic. It will not be stored at home. NOTE: This sheet is a summary. It may not cover all possible information. If you have questions about this medicine, talk to your doctor, pharmacist, or health care provider.  2024 Elsevier/Gold Standard (2022-11-24 00:00:00)      To help prevent nausea and vomiting after your treatment, we encourage you to take your nausea medication as directed.  BELOW ARE SYMPTOMS THAT SHOULD BE REPORTED IMMEDIATELY: *FEVER GREATER THAN 100.4 F (38 C) OR HIGHER *CHILLS OR SWEATING *NAUSEA AND VOMITING THAT IS NOT CONTROLLED WITH YOUR NAUSEA MEDICATION *UNUSUAL SHORTNESS OF BREATH *UNUSUAL BRUISING OR BLEEDING *URINARY PROBLEMS (pain or burning when urinating, or frequent urination) *BOWEL PROBLEMS (unusual diarrhea, constipation, pain near the anus) TENDERNESS IN MOUTH AND THROAT WITH OR WITHOUT PRESENCE OF ULCERS (sore throat, sores in mouth, or a toothache) UNUSUAL RASH, SWELLING OR PAIN  UNUSUAL VAGINAL DISCHARGE OR ITCHING   Items with * indicate a potential emergency and should be followed up as soon as possible or go to the Emergency Department if any problems should occur.  Please show the CHEMOTHERAPY ALERT CARD or IMMUNOTHERAPY ALERT CARD at check-in to the Emergency Department and triage nurse.  Should you have questions after your visit or need to cancel or reschedule your appointment, please contact [facility] [place] - A DEPT OF [facility] [phone]  and follow the prompts.  Office hours are 8:00 a.m. to 4:30 p.m. Monday - Friday. Please note that voicemails left after 4:00 p.m. may not be returned until the following business day.  We are closed weekends  and major holidays. You have access to a nurse at all times for urgent questions. Please call the main number to the clinic [phone] and follow the prompts.  For any non-urgent questions, you may also contact your provider using MyChart. We now offer e-Visits for anyone 67 and older to request care online for non-urgent symptoms. For details visit [email or web address].   Also download the MyChart app! Go to the app store, search "MyChart", open the app, select [place], and log in with your MyChart username and password.

## 2023-07-19 ENCOUNTER — Ambulatory Visit: Payer: 59 | Admitting: Adult Health

## 2023-07-22 ENCOUNTER — Inpatient Hospital Stay: Payer: 59 | Attending: Oncology

## 2023-07-22 ENCOUNTER — Ambulatory Visit (HOSPITAL_COMMUNITY)
Admission: RE | Admit: 2023-07-22 | Discharge: 2023-07-22 | Disposition: A | Discharge status: Home / Self Care | Source: Ambulatory Visit | Attending: Adult Health | Admitting: Adult Health

## 2023-07-22 DIAGNOSIS — D696 Thrombocytopenia, unspecified: Secondary | ICD-10-CM | POA: Insufficient documentation

## 2023-07-22 DIAGNOSIS — Z1231 Encounter for screening mammogram for malignant neoplasm of breast: Secondary | ICD-10-CM | POA: Insufficient documentation

## 2023-07-22 DIAGNOSIS — K50011 Crohn's disease of small intestine with rectal bleeding: Secondary | ICD-10-CM | POA: Diagnosis present

## 2023-07-22 DIAGNOSIS — D500 Iron deficiency anemia secondary to blood loss (chronic): Secondary | ICD-10-CM | POA: Diagnosis present

## 2023-07-22 DIAGNOSIS — Z79899 Other long term (current) drug therapy: Secondary | ICD-10-CM | POA: Insufficient documentation

## 2023-07-22 LAB — CBC WITH DIFFERENTIAL/PLATELET
Abs Immature Granulocytes: 0.02 10*3/uL (ref 0.00–0.07)
Basophils Absolute: 0 10*3/uL (ref 0.0–0.1)
Basophils Relative: 1 %
Eosinophils Absolute: 0.1 10*3/uL (ref 0.0–0.5)
Eosinophils Relative: 1 %
HCT: 37.6 % (ref 36.0–46.0)
Hemoglobin: 12.4 g/dL (ref 12.0–15.0)
Immature Granulocytes: 0 %
Lymphocytes Relative: 21 %
Lymphs Abs: 1.4 10*3/uL (ref 0.7–4.0)
MCH: 28.6 pg (ref 26.0–34.0)
MCHC: 33 g/dL (ref 30.0–36.0)
MCV: 86.8 fL (ref 80.0–100.0)
Monocytes Absolute: 0.4 10*3/uL (ref 0.1–1.0)
Monocytes Relative: 6 %
Neutro Abs: 4.6 10*3/uL (ref 1.7–7.7)
Neutrophils Relative %: 71 %
Platelets: 163 10*3/uL (ref 150–400)
RBC: 4.33 MIL/uL (ref 3.87–5.11)
RDW: 21.9 % — ABNORMAL HIGH (ref 11.5–15.5)
Smear Review: ADEQUATE
WBC: 6.4 10*3/uL (ref 4.0–10.5)
nRBC: 0 % (ref 0.0–0.2)

## 2023-07-22 LAB — IRON AND TIBC
Iron: 166 ug/dL (ref 28–170)
Saturation Ratios: 50 % — ABNORMAL HIGH (ref 10.4–31.8)
TIBC: 334 ug/dL (ref 250–450)
UIBC: 168 ug/dL

## 2023-07-22 LAB — COMPREHENSIVE METABOLIC PANEL WITH GFR
ALT: 17 U/L (ref 0–44)
AST: 32 U/L (ref 15–41)
Albumin: 3.8 g/dL (ref 3.5–5.0)
Alkaline Phosphatase: 107 U/L (ref 38–126)
Anion gap: 12 (ref 5–15)
BUN: 11 mg/dL (ref 8–23)
CO2: 22 mmol/L (ref 22–32)
Calcium: 9.3 mg/dL (ref 8.9–10.3)
Chloride: 100 mmol/L (ref 98–111)
Creatinine, Ser: 0.98 mg/dL (ref 0.44–1.00)
GFR, Estimated: >60 mL/min (ref >60)
Glucose, Bld: 126 mg/dL — ABNORMAL HIGH (ref 70–99)
Potassium: 3.3 mmol/L — ABNORMAL LOW (ref 3.5–5.1)
Sodium: 134 mmol/L — ABNORMAL LOW (ref 135–145)
Total Bilirubin: 0.7 mg/dL (ref 0.0–1.2)
Total Protein: 8.2 g/dL — ABNORMAL HIGH (ref 6.5–8.1)

## 2023-07-22 LAB — FERRITIN: Ferritin: 402 ng/mL — ABNORMAL HIGH (ref 11–307)

## 2023-07-22 LAB — VITAMIN B12: Vitamin B-12: 374 pg/mL (ref 180–914)

## 2023-07-22 LAB — FOLATE: Folate: >40 ng/mL (ref >5.9)

## 2023-07-29 ENCOUNTER — Inpatient Hospital Stay: Payer: 59 | Admitting: Oncology

## 2023-08-08 ENCOUNTER — Encounter (HOSPITAL_COMMUNITY): Payer: Self-pay

## 2023-08-08 ENCOUNTER — Inpatient Hospital Stay: Admitting: Oncology

## 2023-08-09 ENCOUNTER — Other Ambulatory Visit (INDEPENDENT_AMBULATORY_CARE_PROVIDER_SITE_OTHER): Payer: Self-pay | Admitting: Gastroenterology

## 2023-08-09 DIAGNOSIS — R112 Nausea with vomiting, unspecified: Secondary | ICD-10-CM

## 2023-08-09 NOTE — Telephone Encounter (Signed)
 Must keep upcoming appt in April 2025 to get further refills.

## 2023-08-11 ENCOUNTER — Inpatient Hospital Stay (HOSPITAL_BASED_OUTPATIENT_CLINIC_OR_DEPARTMENT_OTHER): Admitting: Oncology

## 2023-08-11 VITALS — BP 119/70 | HR 72 | Temp 98.4°F | Resp 17 | Wt 98.0 lb

## 2023-08-11 DIAGNOSIS — K50019 Crohn's disease of small intestine with unspecified complications: Secondary | ICD-10-CM

## 2023-08-11 DIAGNOSIS — D500 Iron deficiency anemia secondary to blood loss (chronic): Secondary | ICD-10-CM | POA: Diagnosis not present

## 2023-08-11 DIAGNOSIS — D696 Thrombocytopenia, unspecified: Secondary | ICD-10-CM | POA: Diagnosis not present

## 2023-08-11 NOTE — Assessment & Plan Note (Signed)
 Patient reports change in symptoms (blood in stool, loss of appetite) since starting [name]. Previously well-managed on Humira. -Recommended follow-up with gastroenterologist (Dr. Levon Hedger) to discuss medication side effects and potential alternatives.

## 2023-08-11 NOTE — Progress Notes (Signed)
 [facility] at [facility]  HEMATOLOGY FOLLOW-UP VISIT  REASON FOR FOLLOW-UP: Iron deficiency anemia  ASSESSMENT & PLAN:  Patient is a 63 y.o. female following for iron deficiency anemia  Iron deficiency anemia due to chronic blood loss Low ferritin level (7) likely secondary to chronic blood loss from Crohn's disease.  Patient has been non-compliant with oral iron supplementation.   Last colonoscopy and endoscopy did not show acute bleed. 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   No signs of acute bleeding at this time.   Recent labs with normal platelets  - Continue to monitor counts.  Inflammatory bowel disease (Crohn's disease) (HCC) Patient reports change in symptoms (blood in stool, loss of appetite) since starting [name] .  Previously well-managed on Humira .  -Recommended follow-up with gastroenterologist (Dr. Sammi Crick) to discuss medication side effects and potential alternatives.   Orders Placed This Encounter  Procedures   Ferritin    Standing Status:   Future    Expected Date:   11/07/2023    Expiration Date:   08/10/2024   Folate    Standing Status:   Future    Expected Date:   11/07/2023    Expiration Date:   08/10/2024   Vitamin B12    Standing Status:   Future    Expected Date:   11/07/2023    Expiration Date:   08/10/2024   CBC with Differential/Platelet    Standing Status:   Future    Expected Date:   11/07/2023    Expiration Date:   08/10/2024   Comprehensive metabolic panel with GFR    Standing Status:   Future    Expected Date:   11/07/2023    Expiration Date:   08/10/2024   Iron and TIBC    Standing Status:   Future     Expected Date:   11/07/2023    Expiration Date:   08/10/2024    The total time spent in the appointment was 20 minutes encounter with patients including review of chart and various tests results, discussions about plan of care and coordination of care plan   All questions were answered. The patient knows to call the clinic with any problems, questions or concerns. No barriers to learning was detected.  Eduardo Grade, MD 4/24/20253:52 PM   SUMMARY OF HEMATOLOGIC HISTORY: Iron deficiency anemia secondary to Crohn's disease - S/p IV Feraheme 510 mg on 07/01/2023 and 07/08/2023  INTERVAL HISTORY: [name] 63 y.o. female following for iron deficiency anemia.She has been experiencing low energy and lack of appetite, although she feels better than she has in a while. Despite her lack of appetite, she maintains a diet rich in fruits and vegetables. She has not consulted a nutritionist and prefers not to see additional specialists.  Her history of iron deficiency anemia has improved following an iron infusion. Her iron levels are currently stable, and she is no longer anemic. She continues to take iron pills every other day and feels less groggy and foggy since starting the iron supplementation.  She follows with GI for her Crohn's disease.  I have reviewed the past medical history, past surgical history, social history and family history with the  patient   ALLERGIES:  has no known allergies.  MEDICATIONS:  Current Outpatient Medications  Medication Sig Dispense Refill   alendronate (FOSAMAX) 70 MG tablet Take 70 mg by mouth once a week.     carvedilol  (COREG ) 3.125 MG tablet Take 1 tablet (3.125 mg total) by mouth 2 (two) times daily with a meal. 180 tablet 1   famotidine  (PEPCID ) 20 MG tablet Take 20 mg by mouth daily.     Multiple Vitamin (MULTIVITAMIN WITH MINERALS) TABS tablet Take 1 tablet by mouth daily.     ondansetron  (ZOFRAN ) 4 MG tablet TAKE (1) TABLET BY MOUTH EVERY EIGHT HOURS AS  NEEDED FOR NAUSEA OR VOMITING. 90 tablet 0   OXYCONTIN  20 MG 12 hr tablet Take 20 mg by mouth 2 (two) times daily.     pantoprazole  (PROTONIX ) 40 MG tablet Take 1 tablet (40 mg total) by mouth daily. 30 tablet 3   [name]  360 MG/2.4ML SOCT by Intravenous (Continuous Infusion) route every 3 (three) months.     spironolactone  (ALDACTONE ) 100 MG tablet Take 0.5 tablets (50 mg total) by mouth daily.     Turmeric (QC TUMERIC COMPLEX PO) Take by mouth daily at 6 (six) AM.     Vitamin D , Ergocalciferol , (DRISDOL) 1.25 MG (50000 UNIT) CAPS capsule Take 50,000 Units by mouth once a week.     No current facility-administered medications for this visit.     REVIEW OF SYSTEMS:   Constitutional: Denies fevers, chills or night sweats Eyes: Denies blurriness of vision Ears, nose, mouth, throat, and face: Denies mucositis or sore throat Respiratory: Denies cough, dyspnea or wheezes Cardiovascular: Denies palpitation, chest discomfort or lower extremity swelling Gastrointestinal:  Denies nausea, heartburn or change in bowel habits Skin: Denies abnormal skin rashes Lymphatics: Denies new lymphadenopathy or easy bruising Neurological:Denies numbness, tingling or new weaknesses Behavioral/Psych: Mood is stable, no new changes  All other systems were reviewed with the patient and are negative.  PHYSICAL EXAMINATION:   Vitals:   08/11/23 1433  BP: 119/70  Pulse: 72  Resp: 17  Temp: 98.4 F (36.9 C)  SpO2: 100%    GENERAL:alert, no distress and comfortable SKIN: skin color, texture, turgor are normal, no rashes or significant lesions LUNGS: clear to auscultation and percussion with normal breathing effort HEART: regular rate & rhythm and no murmurs and no lower extremity edema ABDOMEN:abdomen soft, non-tender and normal bowel sounds Musculoskeletal:no cyanosis of digits and no clubbing  NEURO: alert & oriented x 3 with fluent speech  LABORATORY DATA:  I have reviewed the data as listed  Lab  Results  Component Value Date   WBC 6.4 07/22/2023   NEUTROABS 4.6 07/22/2023   HGB 12.4 07/22/2023   HCT 37.6 07/22/2023   MCV 86.8 07/22/2023   PLT 163 07/22/2023      Chemistry      Component Value Date/Time   NA 134 (L) 07/22/2023 0959   K 3.3 (L) 07/22/2023 0959   CL 100 07/22/2023 0959   CO2 22 07/22/2023 0959   BUN 11 07/22/2023 0959   BUN 6 (L) 03/25/2020 1723   CREATININE 0.98 07/22/2023 0959   CREATININE 0.70 03/10/2023 1439      Component Value Date/Time   CALCIUM  9.3 07/22/2023 0959   ALKPHOS 107 07/22/2023 0959   AST 32 07/22/2023 0959   ALT 17 07/22/2023 0959   BILITOT 0.7 07/22/2023 0959      Latest Reference Range & Units 07/22/23 09:58 07/22/23 09:59  Iron 28 - 170  ug/dL 161   UIBC ug/dL 096   TIBC 045 - 409 ug/dL 811   Saturation Ratios 10.4 - 31.8 % 50 (H)   Ferritin 11 - 307 ng/mL 402 (H)   Folate >5.9 ng/mL  >40.0  (H): Data is abnormally high   Latest Reference Range & Units 07/22/23 09:58  Vitamin B12 180 - 914 pg/mL 374

## 2023-08-11 NOTE — Assessment & Plan Note (Signed)
 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   No signs of acute bleeding at this time.   Recent labs with normal platelets  - Continue to monitor counts.

## 2023-08-11 NOTE — Patient Instructions (Signed)
 VISIT SUMMARY:  Today, we discussed your follow-up for iron deficiency anemia and its connection to your Crohn's disease. You reported feeling better overall, with improved energy levels and a stable diet, despite some recent weight loss. Your iron levels have stabilized following an iron infusion, and you are no longer anemic.  YOUR PLAN:  -IRON DEFICIENCY ANEMIA: Iron deficiency anemia occurs when your body doesn't have enough iron to produce adequate levels of hemoglobin, which is necessary for carrying oxygen in your blood. Your anemia has improved with iron infusion, and your iron levels are now stable. You should continue taking iron pills every other day for the next three months. We will reassess your iron levels in three months to determine if you can stop the supplementation.  -CROHN'S DISEASE: Crohn's disease is a chronic inflammatory condition of the gastrointestinal tract that can cause symptoms like abdominal pain, diarrhea, and weight loss. It can also lead to iron deficiency anemia due to small, unnoticeable blood loss. You should continue your regular follow-ups with your gastroenterologist to manage this condition.  INSTRUCTIONS:  Please continue taking your iron pills every other day for the next three months. We will reassess your iron levels at that time to see if you can stop the supplementation. Also, continue your regular follow-ups with your gastroenterologist to manage your Crohn's disease.

## 2023-08-11 NOTE — Assessment & Plan Note (Signed)
 Low ferritin level (7) likely secondary to chronic blood loss from Crohn's disease.  Patient has been non-compliant with oral iron supplementation.   Last colonoscopy and endoscopy did not show acute bleed. S/p IV Feraheme X 2 doses of 510 mg each with improvement in iron levels and resolution of anemia  - Continue oral iron supplementation every other day  Return to clinic in 3 months with labs to reassess iron levels and further need for IV iron

## 2023-08-15 ENCOUNTER — Encounter (INDEPENDENT_AMBULATORY_CARE_PROVIDER_SITE_OTHER): Payer: Self-pay | Admitting: Gastroenterology

## 2023-08-15 ENCOUNTER — Ambulatory Visit (INDEPENDENT_AMBULATORY_CARE_PROVIDER_SITE_OTHER): Admitting: Gastroenterology

## 2023-08-15 VITALS — BP 101/67 | HR 76 | Temp 97.8°F | Ht 62.0 in | Wt 97.2 lb

## 2023-08-15 DIAGNOSIS — K5000 Crohn's disease of small intestine without complications: Secondary | ICD-10-CM | POA: Diagnosis not present

## 2023-08-15 DIAGNOSIS — E559 Vitamin D deficiency, unspecified: Secondary | ICD-10-CM

## 2023-08-15 DIAGNOSIS — D509 Iron deficiency anemia, unspecified: Secondary | ICD-10-CM | POA: Diagnosis not present

## 2023-08-15 DIAGNOSIS — K7460 Unspecified cirrhosis of liver: Secondary | ICD-10-CM | POA: Diagnosis not present

## 2023-08-15 DIAGNOSIS — D500 Iron deficiency anemia secondary to blood loss (chronic): Secondary | ICD-10-CM

## 2023-08-15 DIAGNOSIS — K7469 Other cirrhosis of liver: Secondary | ICD-10-CM

## 2023-08-15 NOTE — Progress Notes (Signed)
 Referring Provider: Barbaraann Bookbinder, Rosea Conch, * Primary Care Physician:  Patient, No Pcp Per Primary GI Physician: Dr. Sammi Crick   Chief Complaint  Patient presents with   Crohn's Disease    Follow up on Crohn's. Will take first skyrizi  injection on 4/30.    Weight Loss    Concerned about weight loss.    HPI:   [name] is a 63 y.o. female with past medical history of  ileal Crohn's disease s/p small bowel resection, COPD, chronic pancreatitis, RA, GERD, suspected possible EPI, cirrhosis suspected due to prior 6-MP and full serologies negative for other etiologies, chronic biliary dilatation and seen in consultation by [facility]   Patient presenting today for follow up of:  Crohn's disease Cirrhosis Possible EPI IDA  Last seen in clinic in October 2024, at that time having BRBPR, more frequent stooling, occasional formed stools. Heartburn and pressure in chest after eating. Started back on protonix  40mg  by pain management doctor. Swelling to the abdomen with tightness. Taking zofran  PRN, had not yet scheduled ERCP/EUS with [facility], unsure if she was taking creon  Recommended to continue humira  every other week, schedule colonoscopy, MELD labs, EUS/ERCP, AFP, CBC, abd US  complete, vit d levels, increase PPI TO BID, continue vit d supp, continue zofran , creon if colonoscopy without inflammation  Colonoscopy as below with crohns ileitis, advised to stop humira  and start skyrizi   EGD with Grade 1 EVs, started on carvedilol  3.125mg  BID  Unfortunately patient presented to the ED on 06/13/23 with hgb 5.5, s/p 2 units PRBCs improved to 8.5, no gross rectal bleeding, heme neg stool. Had completed induction of skyrizi , INR 3.4, suspected slow ooze from uncontrolled Crohn's disease, given Vitamin K, no endoscopic evaluations performed   Currently following with hematology, last seen 4/24, feraheme x2, continued on PO iron every other day  Present: Last hgb on 4/4 was 12.4, ferritin 402,  b12 374, Iron 166, TIBC 334, sat 50 INR 1.5 on 2/25 Previous MELD 3.0  12 on feb 22nd  Saw Dr. Jeannette Mills for possible ERCP/EUS, had updated MRI and was advised no further evaluation needed given no changes from previous   Has had 3 infusions of skyrizi , doing much better on this. Has first maintenance dose due on 4/30. States she presented with dizziness and feeling bad in February but doing much better since this. Having a BM 1-3 per day, stools are more solid, still soft. No diarrhea. No rectal bleeding. She continues to have some lower abdominal cramping intermittently, usually thinks this is brought on by certain types of foods, having a BM sometimes helps improve her cramping.   She is doing iron pill and vitamin D , following up with hematology in 3 months, feeling better since iron infusions. Thinks she is only taking carvedilol  once as she states she was told in the hospital to take this daily. States her pain management doctor talked about doing bone density test but she did not recall that last was in march of 2024 (showed osteoporosis).   Appetite is good. She has lost some weight. Notes she eats a good breakfast and supper and snacks in between breakfast and dinner and again after dinner. Doing 1 ensure shake per day. She has some nausea but no vomiting.   Denies episodes of confusion, jaundice or pruritus.    Cirrhosis related questions: Episodes of confusion/disorientation: none  Taking diuretics? None  Beta blockers? Coreg  3.125mg , only taking daily  Prior history of variceal banding? None  Prior episodes of  SBP? None  Last liver imaging: MR abd w wo 05/18/23 no gross liver lesions  Alcohol use: none  last AFP: October 4.6    Last Colonoscopy:02/2023  - Crohn's disease with ileitis. Inflammation was                            found. This was graded as Rutgeerts Score i2 (more                            than five aphthous lesions with normal intervening                             mucosa or skip areas of larger lesions or lesions                            confined to the ileocolonic anastomosis). Biopsied.                           - Stricture in the terminal ileum.                           - Patent end-to-side ileo-colonic anastomosis,                            characterized by friable mucosa.                           - Diverticulosis in the sigmoid colon and in the                            descending colon.                           - Patent end-to-side colo-colonic anastomosis,                            characterized by healthy appearing mucosa.                           - The distal rectum and anal verge are normal Last Endoscopy:02/2023  Grade I esophageal varices.                           - Normal stomach.                           - Normal examined duodenum.                           - No specimens collected.    Past Medical History:  Diagnosis Date   Anorexia 10/01/2010   Arthritis    Chronic pain syndrome    bones and stomach   COPD (chronic obstructive pulmonary disease) (HCC)    Crohn's 10/01/2010   Diarrhea 10/01/2010   Emesis 10/01/2010   GERD (gastroesophageal reflux disease)    Lower abdominal pain 10/01/2010   Pancreatitis    Rectal bleeding 10/01/2010  Seasonal allergies     Past Surgical History:  Procedure Laterality Date    Partial Hysterectomy  about 20 years ago   BIOPSY  06/23/2021   Procedure: BIOPSY;  Surgeon: Urban Garden, MD;  [facility];  Service: Gastroenterology;;   BIOPSY  03/08/2023   Procedure: BIOPSY;  Surgeon: Urban Garden, MD;  [facility];  Service: Gastroenterology;;   CHOLECYSTECTOMY     COLECTOMY  1995   x2   COLONOSCOPY  01/17/2006   October 2007 revealinga ileal ulcers proximal to anastomosis without stricture and a single rectal ulcer.   COLONOSCOPY WITH PROPOFOL  N/A 04/02/2016   Procedure: COLONOSCOPY WITH PROPOFOL ;  Surgeon: Ruby Corporal, MD;   [facility];  Service: Endoscopy;  Laterality: N/A;  1:00   COLONOSCOPY WITH PROPOFOL  N/A 06/23/2021   Procedure: COLONOSCOPY WITH PROPOFOL ;  Surgeon: Urban Garden, MD;  [facility];  Service: Gastroenterology;  Laterality: N/A;  805 ASA 1   COLONOSCOPY WITH PROPOFOL  N/A 03/08/2023   Procedure: COLONOSCOPY WITH PROPOFOL ;  Surgeon: Urban Garden, MD;  [facility];  Service: Gastroenterology;  Laterality: N/A;  1:45PM;ASA 3   ESOPHAGOGASTRODUODENOSCOPY (EGD) WITH PROPOFOL  N/A 11/21/2013   Procedure: ESOPHAGOGASTRODUODENOSCOPY (EGD) WITH PROPOFOL ;  Surgeon: Ruby Corporal, MD;  Location: [facility];  Service: Endoscopy;  Laterality: N/A;   ESOPHAGOGASTRODUODENOSCOPY (EGD) WITH PROPOFOL  N/A 03/08/2023   Procedure: ESOPHAGOGASTRODUODENOSCOPY (EGD) WITH PROPOFOL ;  Surgeon: Urban Garden, MD;  [facility];  Service: Gastroenterology;  Laterality: N/A;   HERNIA REPAIR     TOOTH EXTRACTION Left 10/17/2013   left lower molar    Current Outpatient Medications  Medication Sig Dispense Refill   alendronate (FOSAMAX) 70 MG tablet Take 70 mg by mouth once a week.     carvedilol  (COREG ) 3.125 MG tablet Take 1 tablet (3.125 mg total) by mouth 2 (two) times daily with a meal. 180 tablet 1   famotidine  (PEPCID ) 20 MG tablet Take 20 mg by mouth daily.     Multiple Vitamin (MULTIVITAMIN WITH MINERALS) TABS tablet Take 1 tablet by mouth daily.     ondansetron  (ZOFRAN ) 4 MG tablet TAKE (1) TABLET BY MOUTH EVERY EIGHT HOURS AS NEEDED FOR NAUSEA OR VOMITING. 90 tablet 0   OXYCONTIN  20 MG 12 hr tablet Take 20 mg by mouth 2 (two) times daily.     pantoprazole  (PROTONIX ) 40 MG tablet Take 1 tablet (40 mg total) by mouth daily. 30 tablet 3   [name]  360 MG/2.4ML SOCT by Intravenous (Continuous Infusion) route every 3 (three) months.     Turmeric (QC TUMERIC COMPLEX PO) Take by mouth daily at 6 (six) AM.     Vitamin D , Ergocalciferol ,  (DRISDOL) 1.25 MG (50000 UNIT) CAPS capsule Take 50,000 Units by mouth once a week.     spironolactone  (ALDACTONE ) 100 MG tablet Take 0.5 tablets (50 mg total) by mouth daily. (Patient not taking: Reported on 08/15/2023)     No current facility-administered medications for this visit.    Allergies as of 08/15/2023   (No Known Allergies)    Social History   Socioeconomic History   Marital status: Divorced    Spouse name: Not on file   Number of children: 2   Years of education: Not on file   Highest education level: Not on file  Occupational History   Occupation: disabled  Tobacco Use   Smoking status: Former    Current packs/day: 0.00  Average packs/day: 0.3 packs/day for 35.0 years (8.8 ttl pk-yrs)    Types: Cigarettes    Start date: 11/28/1980    Quit date: 11/29/2015    Years since quitting: 7.7    Passive exposure: Past   Smokeless tobacco: Never  Vaping Use   Vaping status: Never Used  Substance and Sexual Activity   Alcohol use: No   Drug use: No   Sexual activity: Not Currently    Birth control/protection: Surgical    Comment: hyst  Other Topics Concern   Not on file  Social History Narrative   Not on file   Social Drivers of Health   Financial Resource Strain: Low Risk  (06/02/2022)   Overall Financial Resource Strain (CARDIA)    Difficulty of Paying Living Expenses: Not very hard  Food Insecurity: No Food Insecurity (06/11/2023)   Hunger Vital Sign    Worried About Running Out of Food in the Last Year: Never true    Ran Out of Food in the Last Year: Never true  Transportation Needs: No Transportation Needs (06/11/2023)   PRAPARE - Transportation    Lack of Transportation (Medical): No    Lack of Transportation (Non-Medical): No  Physical Activity: Insufficiently Active (06/02/2022)   Exercise Vital Sign    Days of Exercise per Week: 3 days    Minutes of Exercise per Session: 30 min  Stress: No Stress Concern Present (06/02/2022)   [employer] of  Occupational Health - Occupational Stress Questionnaire    Feeling of Stress : Not at all  Social Connections: Moderately Integrated (06/02/2022)   Social Connection and Isolation Panel [NHANES]    Frequency of Communication with Friends and Family: More than three times a week    Frequency of Social Gatherings with Friends and Family: Three times a week    Attends Religious Services: More than 4 times per year    Active Member of Clubs or Organizations: Yes    Attends Engineer, structural: More than 4 times per year    Marital Status: Divorced    Review of systems General: negative for malaise, night sweats, fever, chills +weight loss Neck: Negative for lumps, goiter, pain and significant neck swelling Resp: Negative for cough, wheezing, dyspnea at rest CV: Negative for chest pain, leg swelling, palpitations, orthopnea GI: denies melena, hematochezia, nausea, vomiting, diarrhea, constipation, dysphagia, odyonophagia, early satiety or unintentional weight loss. +lower abdominal cramping  MSK: Negative for joint pain or swelling, back pain, and muscle pain. Derm: Negative for itching or rash Psych: Denies depression, anxiety, memory loss, confusion. No homicidal or suicidal ideation.  Heme: Negative for prolonged bleeding, bruising easily, and swollen nodes. Endocrine: Negative for cold or heat intolerance, polyuria, polydipsia and goiter. Neuro: negative for tremor, gait imbalance, syncope and seizures. The remainder of the review of systems is noncontributory.  Physical Exam: BP 101/67   Pulse 76   Temp 97.8 F (36.6 C)   Ht 5\' 2"  (1.575 m)   Wt 97 lb 3.2 oz (44.1 kg)   BMI 17.78 kg/m  General:   Alert and oriented. No distress noted. Pleasant and cooperative.  Head:  Normocephalic and atraumatic. Eyes:  Conjuctiva clear without scleral icterus. Mouth:  Oral mucosa pink and moist. Good dentition. No lesions. Heart: Normal rate and rhythm, s1 and s2 heart sounds  present.  Lungs: Clear lung sounds in all lobes. Respirations equal and unlabored. Abdomen:  +BS, soft, and non-distended. Mild TTP of LLQ. No rebound or guarding. No HSM or  masses noted. Derm: No palmar erythema or jaundice Msk:  Symmetrical without gross deformities. Normal posture. Extremities:  Without edema. Neurologic:  Alert and  oriented x4 Psych:  Alert and cooperative. Normal mood and affect.  Invalid input(s): "6 MONTHS"   ASSESSMENT: ANNYSSA MOFFIT is a 63 y.o. female presenting today for follow up of ileal Crohn's disease, Cirrhosis, IDA and possible EPI  Crohn's disease: Previously on Humira , colonoscopy in November with Crohn's ileitis graded as Rutgeerts Score i2,Stricture in the terminal ileum, Patent end-to-side ileo-colonic anastomosis, characterized by friable mucosa.  She was started on [name]  thereafter which was induced in December.  She is feeling much better, having 1-3 stools per day though stools are formed but softer.  She denies any diarrhea or constipation.  No rectal bleeding or melena.  She continues to have some lower abdominal discomfort cramping, she thinks it is usually related to certain foods that she eats and improves with defecation.  She is set to have her first maintenance dose of [name]  on 4/30.  Will consider colonoscopy to evaluate for endoscopic remission at follow-up visit after early 6 months of being on therapy.  She has history of vitamin D  deficiency, last vitamin D  levels were low will recheck vitamin D  levels today, continue supplementation.  She is up-to-date on DEXA scan as last was in March 2024 with presence of osteoporosis.    IDA: Unfortunately she had some iron deficiency anemia which she presented to the hospital for in February, thought secondary to bleeding Crohn's ileitis.  This has improved with PO iron and iron infusions, she is following with hematology. No rectal bleeding or melena.   Cirrhosis: appears well compensated. Up to date  on MELD labs with MELD 3.0 last in February of 12. MRI abdomen in January without liver lesions. EGD as above with grade 1 EVs, started on carvedilol  3.125mg  BID which she states she was told to decrease to daily in the hospital, at this time, would recommend she increase back to BID dosing as HR is not at goal, 76 bpm today. She should make me aware if any dizziness, lightheadedness or intolerance to NSBB. Will update AFP tumor marker today.   Possible EPI/weight loss: pancreatic atrophy noted on cross sectional imaging, was provided creon samples in the past though unclear if she took these. She has had some weight loss, query if EPI is contributing, given recent endoscopic evaluations and cross sectional imaging of the abdomen. Will consider addition of creon at follow up visit if weight continues to decline. She is doing protein shake daily which she should continue with  Notably she was seen by Dr. Dr. Jeannette Mills with [facility] for possible ERCP/EUS due to biliary dilatation, she had updated MRI and was advised no further evaluation ( EUS/ERCP) needed given no changes from previous, suspected dilatation secondary to s/p cholecystectomy. Most recent LFTs WNL.    PLAN:  -continue skyrizi  q8w -MELD labs due again August -liver imaging for [facility] screening due July - Reduce salt intake to <2 g per day - Can take Tylenol  max of 2 g per day (650 mg q8h) for pain - Avoid NSAIDs for pain - Avoid eating raw oysters/shellfish - Ensure every night before going to sleep -continue carvdilol 3.125mg , go back to BID dosing  -check vitamin D  levels, continue supplementation  -Update AFP -consider colonoscopy after 6 months on skyrizi , pending clinical course  -consider addition of creon at follow up if weight loss persists, given pancreatic atrophy noted on recent  MRI   All questions were answered, patient verbalized understanding and is in agreement with plan as outlined above.   Follow Up: 3 months    Michelle Cheramie L. Adrien Alberta, MSN, APRN, AGNP-C Adult-Gerontology Nurse Practitioner [facility]   I have reviewed the note and agree with the APP's assessment as described in this progress note  Samantha Cress, MD Gastroenterology and Hepatology [facility] Gastroenterology

## 2023-08-15 NOTE — Patient Instructions (Addendum)
-  continue skyrizi  - Reduce salt intake to <2 g per day - Can take Tylenol  max of 2 g per day (650 mg q8h) for pain - Avoid NSAIDs for pain - Avoid eating raw oysters/shellfish - Ensure or boost shake every night before going to sleep -continue carvdilol 3.125mg , please increase this back to TWICE DAILY dosing -check vitamin d  levels and AFP tumor marker - continue taking vitamin d  supplement   Follow up 3 months

## 2023-08-16 LAB — AFP TUMOR MARKER: AFP-Tumor Marker: 4.1 ng/mL

## 2023-08-16 LAB — VITAMIN D 25 HYDROXY (VIT D DEFICIENCY, FRACTURES): Vit D, 25-Hydroxy: 25 ng/mL — ABNORMAL LOW (ref 30–100)

## 2023-08-17 ENCOUNTER — Encounter (INDEPENDENT_AMBULATORY_CARE_PROVIDER_SITE_OTHER): Payer: Self-pay

## 2023-08-26 ENCOUNTER — Ambulatory Visit: Admitting: Adult Health

## 2023-09-01 ENCOUNTER — Ambulatory Visit

## 2023-09-13 ENCOUNTER — Telehealth (INDEPENDENT_AMBULATORY_CARE_PROVIDER_SITE_OTHER): Payer: Self-pay

## 2023-09-13 NOTE — Telephone Encounter (Signed)
 Needs PA on [name]  maintenance. Patient supposed to have had the first maintenance dose on 08/17/2023, after receiving three loading doses. Please advise how often she is to take the Skirizi dose and frequency with the indication of Crohn's.  Last seen by Gayle Kava, NP on 08/15/2023. If patient had done like she was supposed to on 04/30/02025, and if maintenance (360/2.47ml) is every eight weeks next one due 10/15/2023. Please advise if maintenance is 360/2.4 ml Every 8 weeks. Thanks,

## 2023-09-13 NOTE — Telephone Encounter (Signed)
 [name] [address] [place], [place] [address] Hours of Operations: Address: 5 a.m. - 10 p.m. PT, Monday-Friday [id number] 6 a.m. - 3 p.m. PT, Saturday [place], [place] [address] Date: 09/13/2023 To: Urban Garden From: [facility] Phone: [phone] Phone: [phone] Fax: [phone] Reference #: [id number] RE: Prior Authorization Request Patient Name: [name] Patient DOB: 10/22/1960 Patient ID: [id number] Status of Request: Approve Medication Name: [name]  Inj 360/2.4 GPI/NDC: [id number] E220 Decision Notes: [name]  INJ 360/2.4, use as directed, is approved through 04/18/2024 under your Medicare Part D benefit. If the treating physician would like to discuss this coverage decision with the physician or health care professional reviewer, please call [facility] Prior Authorization department at [phone].

## 2023-10-13 ENCOUNTER — Ambulatory Visit: Admitting: Adult Health

## 2023-10-25 ENCOUNTER — Ambulatory Visit: Payer: Self-pay | Admitting: Nurse Practitioner

## 2023-10-28 ENCOUNTER — Other Ambulatory Visit (INDEPENDENT_AMBULATORY_CARE_PROVIDER_SITE_OTHER): Payer: Self-pay | Admitting: Gastroenterology

## 2023-11-03 ENCOUNTER — Inpatient Hospital Stay: Attending: Oncology

## 2023-11-03 DIAGNOSIS — D500 Iron deficiency anemia secondary to blood loss (chronic): Secondary | ICD-10-CM | POA: Insufficient documentation

## 2023-11-03 DIAGNOSIS — D696 Thrombocytopenia, unspecified: Secondary | ICD-10-CM | POA: Insufficient documentation

## 2023-11-03 DIAGNOSIS — K50011 Crohn's disease of small intestine with rectal bleeding: Secondary | ICD-10-CM | POA: Diagnosis present

## 2023-11-03 LAB — CBC WITH DIFFERENTIAL/PLATELET
Abs Immature Granulocytes: 0.03 K/uL (ref 0.00–0.07)
Basophils Absolute: 0.1 K/uL (ref 0.0–0.1)
Basophils Relative: 1 %
Eosinophils Absolute: 0.1 K/uL (ref 0.0–0.5)
Eosinophils Relative: 1 %
HCT: 35.1 % — ABNORMAL LOW (ref 36.0–46.0)
Hemoglobin: 12.2 g/dL (ref 12.0–15.0)
Immature Granulocytes: 1 %
Lymphocytes Relative: 22 %
Lymphs Abs: 1.3 K/uL (ref 0.7–4.0)
MCH: 34 pg (ref 26.0–34.0)
MCHC: 34.8 g/dL (ref 30.0–36.0)
MCV: 97.8 fL (ref 80.0–100.0)
Monocytes Absolute: 0.7 K/uL (ref 0.1–1.0)
Monocytes Relative: 11 %
Neutro Abs: 3.8 K/uL (ref 1.7–7.7)
Neutrophils Relative %: 64 %
Platelets: 125 K/uL — ABNORMAL LOW (ref 150–400)
RBC: 3.59 MIL/uL — ABNORMAL LOW (ref 3.87–5.11)
RDW: 13 % (ref 11.5–15.5)
WBC: 5.9 K/uL (ref 4.0–10.5)
nRBC: 0 % (ref 0.0–0.2)

## 2023-11-03 LAB — COMPREHENSIVE METABOLIC PANEL WITH GFR
ALT: 22 U/L (ref 0–44)
AST: 28 U/L (ref 15–41)
Albumin: 3.5 g/dL (ref 3.5–5.0)
Alkaline Phosphatase: 138 U/L — ABNORMAL HIGH (ref 38–126)
Anion gap: 9 (ref 5–15)
BUN: 9 mg/dL (ref 8–23)
CO2: 25 mmol/L (ref 22–32)
Calcium: 8.8 mg/dL — ABNORMAL LOW (ref 8.9–10.3)
Chloride: 104 mmol/L (ref 98–111)
Creatinine, Ser: 0.85 mg/dL (ref 0.44–1.00)
GFR, Estimated: >60 mL/min (ref >60)
Glucose, Bld: 129 mg/dL — ABNORMAL HIGH (ref 70–99)
Potassium: 4.7 mmol/L (ref 3.5–5.1)
Sodium: 138 mmol/L (ref 135–145)
Total Bilirubin: 0.6 mg/dL (ref 0.0–1.2)
Total Protein: 7.3 g/dL (ref 6.5–8.1)

## 2023-11-03 LAB — IRON AND TIBC
Iron: 70 ug/dL (ref 28–170)
Saturation Ratios: 23 % (ref 10.4–31.8)
TIBC: 301 ug/dL (ref 250–450)
UIBC: 231 ug/dL

## 2023-11-03 LAB — FERRITIN: Ferritin: 89 ng/mL (ref 11–307)

## 2023-11-03 LAB — VITAMIN B12: Vitamin B-12: 252 pg/mL (ref 180–914)

## 2023-11-03 LAB — FOLATE: Folate: 33.2 ng/mL (ref >5.9)

## 2023-11-10 ENCOUNTER — Inpatient Hospital Stay: Admitting: Oncology

## 2023-11-14 ENCOUNTER — Other Ambulatory Visit (INDEPENDENT_AMBULATORY_CARE_PROVIDER_SITE_OTHER): Payer: Self-pay | Admitting: Gastroenterology

## 2023-11-14 ENCOUNTER — Telehealth (INDEPENDENT_AMBULATORY_CARE_PROVIDER_SITE_OTHER): Payer: Self-pay | Admitting: Gastroenterology

## 2023-11-14 DIAGNOSIS — K219 Gastro-esophageal reflux disease without esophagitis: Secondary | ICD-10-CM

## 2023-11-14 MED ORDER — FAMOTIDINE 20 MG PO TABS: 20.0000 mg | ORAL_TABLET | Freq: Every day | ORAL | 3 refills | Status: AC

## 2023-11-14 NOTE — Telephone Encounter (Signed)
 Please advise. Thank you

## 2023-11-14 NOTE — Telephone Encounter (Signed)
 Medication sent to pharmacy

## 2023-11-14 NOTE — Telephone Encounter (Signed)
 Pt left voicemail requesting refill on Famotidine . Pt states the pharmacy has reached out to us  to get a refill, nothing in Epic suggesting pharmacy has reached out.

## 2023-11-15 NOTE — Telephone Encounter (Signed)
 Pt contacted and made aware that refill has been sent

## 2023-11-16 ENCOUNTER — Inpatient Hospital Stay: Admitting: Oncology

## 2023-11-22 ENCOUNTER — Inpatient Hospital Stay: Admitting: Oncology

## 2023-11-22 ENCOUNTER — Ambulatory Visit (INDEPENDENT_AMBULATORY_CARE_PROVIDER_SITE_OTHER): Admitting: Gastroenterology

## 2023-11-28 ENCOUNTER — Other Ambulatory Visit (INDEPENDENT_AMBULATORY_CARE_PROVIDER_SITE_OTHER): Payer: Self-pay | Admitting: Gastroenterology

## 2023-11-28 DIAGNOSIS — R112 Nausea with vomiting, unspecified: Secondary | ICD-10-CM

## 2023-12-01 ENCOUNTER — Telehealth: Payer: Self-pay | Admitting: *Deleted

## 2023-12-01 ENCOUNTER — Encounter (INDEPENDENT_AMBULATORY_CARE_PROVIDER_SITE_OTHER): Payer: Self-pay

## 2023-12-01 ENCOUNTER — Ambulatory Visit (INDEPENDENT_AMBULATORY_CARE_PROVIDER_SITE_OTHER): Admitting: Gastroenterology

## 2023-12-01 ENCOUNTER — Encounter (INDEPENDENT_AMBULATORY_CARE_PROVIDER_SITE_OTHER): Payer: Self-pay | Admitting: Gastroenterology

## 2023-12-01 VITALS — BP 121/72 | HR 61 | Temp 97.9°F | Ht 62.0 in | Wt 103.3 lb

## 2023-12-01 DIAGNOSIS — E559 Vitamin D deficiency, unspecified: Secondary | ICD-10-CM

## 2023-12-01 DIAGNOSIS — K7469 Other cirrhosis of liver: Secondary | ICD-10-CM

## 2023-12-01 DIAGNOSIS — K5090 Crohn's disease, unspecified, without complications: Secondary | ICD-10-CM

## 2023-12-01 DIAGNOSIS — K219 Gastro-esophageal reflux disease without esophagitis: Secondary | ICD-10-CM | POA: Diagnosis not present

## 2023-12-01 DIAGNOSIS — K5000 Crohn's disease of small intestine without complications: Secondary | ICD-10-CM

## 2023-12-01 DIAGNOSIS — D509 Iron deficiency anemia, unspecified: Secondary | ICD-10-CM

## 2023-12-01 DIAGNOSIS — D500 Iron deficiency anemia secondary to blood loss (chronic): Secondary | ICD-10-CM

## 2023-12-01 NOTE — Patient Instructions (Signed)
-  we will get US  of the liver -continue skyrizi  every 8 weeks, please ensure you are not missing any doses, if diarrhea does not improve or abdominal pain does not improve, please let me know -we will update liver labs and vitamin d  levels as well as inflammatory markers -continue taking your vitamin D  supplement and Iron pill twice daily -continue protonix  40mg  daily/famotidine  20mg  at night  -Reduce salt intake to <2 g per day -Can take Tylenol  max of 2 g per day (650 mg q8h) for pain - Avoid NSAIDs for pain - Avoid eating raw oysters/shellfish - Ensure every night before going to sleep  Follow up 3 months  It was a pleasure to see you today. I want to create trusting relationships with patients and provide genuine, compassionate, and quality care. I truly value your feedback! please be on the lookout for a survey regarding your visit with me today. I appreciate your input about our visit and your time in completing this!    Mariellen Blaney L. Margo Lama, MSN, APRN, AGNP-C Adult-Gerontology Nurse Practitioner [facility] Gastroenterology at [facility]

## 2023-12-01 NOTE — Progress Notes (Signed)
 Referring Provider: No ref. provider found Primary Care Physician:  Alphonsa Glendia LABOR, MD Primary GI Physician: Dr. Eartha   Chief Complaint  Patient presents with   Follow-up    Pt arrives for follow up. Today is the first day pt has had solid BM. Pt has been having issues with Crohns for past 3 weeks. Pt did not get dose of [name]  in June due to not letting it sit out for 45 minutes, pt took next dose on Aug 3. Pt also reports nausea. Pt having diarrhea. Pt not able to sleep well.    HPI:   [name] is a 63 y.o. female with past medical history of  ileal Crohn's disease s/p small bowel resection, COPD, chronic pancreatitis, RA, GERD, suspected possible EPI, cirrhosis suspected due to prior 6-MP and full serologies negative for other etiologies, chronic biliary dilatation and seen in consultation by [facility]    Patient presenting today for for follow up of: Ileal Crohn's disease Cirrhosis  Possible EPI IDA GERD  Last seen April 2025, at that time: -hgb on 4/4 was 12.4, ferritin 402, b12 374, Iron 166, TIBC 334, sat 50 INR 1.5 on 2/25, Previous MELD 3.0  12 on feb 22nd -Saw Dr. Alois for possible ERCP/EUS, had updated MRI and was advised no further evaluation needed given no changes from previous  - had 3 infusions of skyrizi , doing much better on this. first maintenance dose due on 4/30.  -Having a BM 1-3 per day, stools are more solid, still soft. No diarrhea. No rectal bleeding. She continues to have some lower abdominal cramping intermittently, usually thinks this is brought on by certain types of foods, having a BM sometimes helps improve her cramping.  - iron pill and vitamin D , following up with hematology in 3 months, feeling better since iron infusions.  -only taking carvedilol  once a day as she states she was told in the hospital to take this daily.  -pain management doctor talked about doing bone density test but she did not recall that last was in march of 2024  (showed osteoporosis).   Recommended continue skyrizi , MELD labs due in August, [facility] screening in July, continue coreg  3.125mg  BID, vitamin D  levels, continue supplementation, update AFP, consider colonoscopy 6 months after skyrizi  start, consider addition of creon at follow up  Vitamin D  25 in April 2025, recommended to increase vit D to 50k units Twice weekly  Present: Last labs in July with Iron 70, TIBC 301, sat 23, ferritin 89 b12 252 folate 33.2, sodium 138 calcium  8.8 ast 28 alt 22 alk phos 138 t bili 0.6, hgb 12.2, plt count 125k   Patient states that she missed her June dose as she did not let her skyrizi  dose sit out for 45 minutes and could not get the injection to work. She took her next dose on August 3rd that was actually due at the end of August. She notes that she was feeling okay prior to missing her dose. She notes that after her missed dose she began having more abdominal pain and diarrhea. She reports upwards of 6 BMs per day, with watery stools. No rectal bleeding. She feels symptoms are improving some after she had her dose on 8/3. She notes solid stools today, still with some abdominal cramping though improving.   Denies swelling to abdomen or legs. No melena. Denies confusion. No longer taking spironolactone   She is taking Vitamin D   daiily, iron BID. Feels energy levels are improving.  No further iron infusions since prior to her last visit.   GERD well controlled on protonix 40mg  daily, famotidine 20mg  at bedtime. Denies dysphagia or odynophagia. Appetite seems to be improving. Had lost some weight but has gained a few pounds back.   She reports taking her coreg twice daily. Denies dizziness, lightheadedness.   Extraintestinal Manifestations: Skin:  bumpier skin no itching or actual rashes  Joints: no joint swelling, has some joint pain   Eyes: wears glasses, some vision changes  Flu shot: declines Covid shots: declines Shingles vaccine: declines  Mammogram: April  2025 Pap smear: upcoming next week Sees eye doctor once per year Dermatology: never    Cirrhosis related questions: Episodes of confusion/disorientation: none  Taking diuretics? None  Beta blockers? Coreg 3.125mg  BID Prior history of variceal banding? None  Prior episodes of SBP? None  Last liver imaging: MR abd w wo 05/18/23 no gross liver lesions  Alcohol use: none  last AFP: April 2025 4.1    Last Colonoscopy:02/2023  - Crohn's disease with ileitis. Inflammation was                            found. This was graded as Rutgeerts Score i2 (more                            than five aphthous lesions with normal intervening                            mucosa or skip areas of larger lesions or lesions                            confined to the ileocolonic anastomosis). Biopsied.                           - Stricture in the terminal ileum.                           - Patent end-to-side ileo-colonic anastomosis,                            characterized by friable mucosa.                           - Diverticulosis in the sigmoid colon and in the                            descending colon.                           - Patent end-to-side colo-colonic anastomosis,                            characterized by healthy appearing mucosa.                           - The distal rectum and anal verge are normal Last Endoscopy:02/2023  Grade I esophageal varices.                           -  Normal stomach.                           - Normal examined duodenum.                           - No specimens collected.  Past Medical History:  Diagnosis Date   Anorexia 10/01/2010   Arthritis    Chronic pain syndrome    bones and stomach   COPD (chronic obstructive pulmonary disease) (HCC)    Crohn's 10/01/2010   Diarrhea 10/01/2010   Emesis 10/01/2010   GERD (gastroesophageal reflux disease)    Lower abdominal pain 10/01/2010   Pancreatitis    Rectal bleeding 10/01/2010   Seasonal allergies      Past Surgical History:  Procedure Laterality Date    Partial Hysterectomy  about 20 years ago   BIOPSY  06/23/2021   Procedure: BIOPSY;  Surgeon: Eartha Angelia Sieving, MD;  [facility];  Service: Gastroenterology;;   BIOPSY  03/08/2023   Procedure: BIOPSY;  Surgeon: Eartha Angelia Sieving, MD;  [facility];  Service: Gastroenterology;;   CHOLECYSTECTOMY     COLECTOMY  1995   x2   COLONOSCOPY  01/17/2006   October 2007 revealinga ileal ulcers proximal to anastomosis without stricture and a single rectal ulcer.   COLONOSCOPY WITH PROPOFOL  N/A 04/02/2016   Procedure: COLONOSCOPY WITH PROPOFOL ;  Surgeon: Claudis RAYMOND Rivet, MD;  [facility];  Service: Endoscopy;  Laterality: N/A;  1:00   COLONOSCOPY WITH PROPOFOL  N/A 06/23/2021   Procedure: COLONOSCOPY WITH PROPOFOL ;  Surgeon: Eartha Angelia Sieving, MD;  [facility];  Service: Gastroenterology;  Laterality: N/A;  805 ASA 1   COLONOSCOPY WITH PROPOFOL  N/A 03/08/2023   Procedure: COLONOSCOPY WITH PROPOFOL ;  Surgeon: Eartha Angelia Sieving, MD;  [facility];  Service: Gastroenterology;  Laterality: N/A;  1:45PM;ASA 3   ESOPHAGOGASTRODUODENOSCOPY (EGD) WITH PROPOFOL  N/A 11/21/2013   Procedure: ESOPHAGOGASTRODUODENOSCOPY (EGD) WITH PROPOFOL ;  Surgeon: Claudis RAYMOND Rivet, MD;  Location: [facility];  Service: Endoscopy;  Laterality: N/A;   ESOPHAGOGASTRODUODENOSCOPY (EGD) WITH PROPOFOL  N/A 03/08/2023   Procedure: ESOPHAGOGASTRODUODENOSCOPY (EGD) WITH PROPOFOL ;  Surgeon: Eartha Angelia Sieving, MD;  [facility];  Service: Gastroenterology;  Laterality: N/A;   HERNIA REPAIR     TOOTH EXTRACTION Left 10/17/2013   left lower molar    Current Outpatient Medications  Medication Sig Dispense Refill   alendronate (FOSAMAX) 70 MG tablet Take 70 mg by mouth once a week.     carvedilol  (COREG ) 3.125 MG tablet TAKE ONE TABLET BY MOUTH TWICE DAILY WITH APPLY MEAL 180 tablet 1    famotidine  (PEPCID ) 20 MG tablet Take 1 tablet (20 mg total) by mouth daily. 90 tablet 3   Multiple Vitamin (MULTIVITAMIN WITH MINERALS) TABS tablet Take 1 tablet by mouth daily.     ondansetron  (ZOFRAN ) 4 MG tablet TAKE (1) TABLET BY MOUTH EVERY EIGHT HOURS AS NEEDED FOR NAUSEA OR VOMITING. 90 tablet 0   OXYCONTIN  20 MG 12 hr tablet Take 20 mg by mouth 2 (two) times daily.     pantoprazole  (PROTONIX ) 40 MG tablet Take 1 tablet (40 mg total) by mouth daily. 30 tablet 3   [name]  360 MG/2.4ML SOCT by Intravenous (Continuous Infusion) route every 3 (three) months.     Turmeric (QC TUMERIC COMPLEX PO) Take by mouth daily at 6 (six) AM.  Vitamin D, Ergocalciferol, (DRISDOL) 1.25 MG (50000 UNIT) CAPS capsule Take 50,000 Units by mouth once a week.     spironolactone (ALDACTONE) 100 MG tablet Take 0.5 tablets (50 mg total) by mouth daily. (Patient not taking: Reported on 08/15/2023)     No current facility-administered medications for this visit.    Allergies as of 12/01/2023   (No Known Allergies)    Social History   Socioeconomic History   Marital status: Divorced    Spouse name: Not on file   Number of children: 2   Years of education: Not on file   Highest education level: 10th grade  Occupational History   Occupation: disabled  Tobacco Use   Smoking status: Former    Current packs/day: 0.00    Average packs/day: 0.3 packs/day for 35.0 years (8.8 ttl pk-yrs)    Types: Cigarettes    Start date: 11/28/1980    Quit date: 11/29/2015    Years since quitting: 8.0    Passive exposure: Past   Smokeless tobacco: Never  Vaping Use   Vaping status: Never Used  Substance and Sexual Activity   Alcohol use: No   Drug use: No   Sexual activity: Not Currently    Birth control/protection: Surgical    Comment: hyst  Other Topics Concern   Not on file  Social History Narrative   Not on file   Social Drivers of Health   Financial Resource Strain: Low Risk  (10/21/2023)   Overall  Financial Resource Strain (CARDIA)    Difficulty of Paying Living Expenses: Not very hard  Food Insecurity: Food Insecurity Present (10/21/2023)   Hunger Vital Sign    Worried About Running Out of Food in the Last Year: Patient declined    Ran Out of Food in the Last Year: Sometimes true  Transportation Needs: No Transportation Needs (10/21/2023)   PRAPARE - Administrator, Civil Service (Medical): No    Lack of Transportation (Non-Medical): No  Physical Activity: Insufficiently Active (10/21/2023)   Exercise Vital Sign    Days of Exercise per Week: 3 days    Minutes of Exercise per Session: 20 min  Stress: Stress Concern Present (10/21/2023)   [employer] of Occupational Health - Occupational Stress Questionnaire    Feeling of Stress: Very much  Social Connections: Moderately Integrated (10/21/2023)   Social Connection and Isolation Panel    Frequency of Communication with Friends and Family: More than three times a week    Frequency of Social Gatherings with Friends and Family: Twice a week    Attends Religious Services: More than 4 times per year    Active Member of [employer] or Organizations: Yes    Attends Engineer, structural: More than 4 times per year    Marital Status: Divorced    Review of systems General: negative for malaise, night sweats, fever, chills, weight loss Neck: Negative for lumps, goiter, pain and significant neck swelling Resp: Negative for cough, wheezing, dyspnea at rest CV: Negative for chest pain, leg swelling, palpitations, orthopnea GI: denies melena, hematochezia, nausea, vomiting, constipation, dysphagia, odyonophagia, early satiety or unintentional weight loss. +abdominal cramping +diarrhea  MSK: Negative for joint pain or swelling, back pain, and muscle pain. Derm: Negative for itching or rash Psych: Denies depression, anxiety, memory loss, confusion. No homicidal or suicidal ideation.  Heme: Negative for prolonged bleeding, bruising  easily, and swollen nodes. Endocrine: Negative for cold or heat intolerance, polyuria, polydipsia and goiter. Neuro: negative for tremor, gait imbalance,  syncope and seizures. The remainder of the review of systems is noncontributory.  Physical Exam: BP 121/72   Pulse 61   Temp 97.9 F (36.6 C)   Ht 5' 2 (1.575 m)   Wt 103 lb 4.8 oz (46.9 kg)   BMI 18.89 kg/m  General:   Alert and oriented. No distress noted. Pleasant and cooperative.  Head:  Normocephalic and atraumatic. Eyes:  Conjuctiva clear without scleral icterus. Mouth:  Oral mucosa pink and moist. Good dentition. No lesions. Heart: Normal rate and rhythm, s1 and s2 heart sounds present.  Lungs: Clear lung sounds in all lobes. Respirations equal and unlabored. Abdomen:  +BS, soft, and non-distended. TTP of lower abdomen, mild TTP of upper abdomen. No rebound or guarding. No HSM or masses noted. Derm: No palmar erythema or jaundice Msk:  Symmetrical without gross deformities. Normal posture. Extremities:  Without edema. Neurologic:  Alert and  oriented x4 Psych:  Alert and cooperative. Normal mood and affect.  Invalid input(s): 6 MONTHS   ASSESSMENT: [name] is a 63 y.o. female presenting today for Ileal Crohn's disease, IDA, GERD, possible EPI/weight loss, Cirrhosis  Crohn's disease: Previously on Humira , colonoscopy in November 2024 with Crohn's ileitis graded as Rutgeerts Score i2,Stricture in the terminal ileum, Patent end-to-side ileo-colonic anastomosis, characterized by friable mucosa.  She was started on [name]  December 2024 and seemed to be doing well on this at last visit in April, however she notes missing her June dose, therefore she went from April to August with no therapy. Began having more abdominal pain and diarrhea. She did take her August dose early (8/3) and seems to be having some improvement in symptoms with solid stools reported today. I suspect her gap in therapy has precipitated these GI  symptoms. For now will check inflammatory markers, may need to consider a short course of steroids if diarrhea/abdominal pain persist due to the gap in therapy she had.  Will consider colonoscopy to evaluate for endoscopic remission at follow-up visit pending inflammatory markers and clinical course. We discussed importance of avoiding missed doses of her [name] .  She has history of vitamin D  deficiency, last vitamin D  levels were low will recheck vitamin D  levels today, continue supplementation.  She is up-to-date on DEXA scan as last was in March 2024 with presence of osteoporosis.    Cirrhosis:appears well compensated with no signs of HE, ascites, LE edema.  MELD 3.0 last in February of 12. MRI abdomen in January without liver lesions. She is overdue for [facility] screening. EGD as above with grade 1 EVs, started on carvedilol  3.125mg  BID which she seems to be tolerating well. Her HR is 61 today, not quite at goal (<60) though will keep her at this dose for now, if trend in HR remains above 60, will need to consider increase in dosage of NSBB. She should make me aware if any dizziness, lightheadedness or intolerance to NSBB. Will update MELD labs, AFP not due to October though due to some non compliance will update at the same time as MELD labs to hopefully ensure compliance and keep labs up to date.   GERD: well controlled on protonix  40mg  daily, famotidine  20mg  at bedtime. No dysphagia or odynophagia, will continue with current regimen.   PIJ:qnoontpwh with hematology, taking PO iron BID. Denies rectal bleeding or melena. Most recent hgb was WNL and iron studies in July also WNL. Should continue to follow with hematology and supplement PO Iron BID for now.   Possible EPI/weight loss:  pancreatic atrophy noted on cross sectional imaging, was provided creon samples in the past though unclear if she took these. has had some weight loss, query if EPI is contributing, given recent endoscopic evaluations and cross  sectional imaging of the abdomen. Reassuringly she has gained a few pounds and diarrhea seems to be improving with skyrizi . Will consider addition of creon at follow up visit if weight continues to decline though I suspect weight loss is likely multifactorial in setting of multimorbidities.   Notably she was seen by Dr. Dr. Alois with [facility] for possible ERCP/EUS due to biliary dilatation, she had updated MRI and was advised no further evaluation ( EUS/ERCP) needed given no changes from previous, suspected dilatation secondary to s/p cholecystectomy. Most recent LFTs WNL.   PLAN:  -US  for HCC screening -continue skyrizi  Q8 weeks, stay compliant, needs to avoid missed doses  -MELD labs, Vit D, AFP -CRP, Calprotectin -consider colonoscopy at follow up visit pending labs and clinical course  -may consider creon if weight continues to decline -continue supplementation of vitamin D  daily, PO Iron BID -Reduce salt intake to <2 g per day -Can take Tylenol  max of 2 g per day (650 mg q8h) for pain - Avoid NSAIDs for pain - Avoid eating raw oysters/shellfish - Ensure every night before going to sleep   All questions were answered, patient verbalized understanding and is in agreement with plan as outlined above.    Follow Up: 3 months   Jacy Howat L. Mariette, MSN, APRN, AGNP-C Adult-Gerontology Nurse Practitioner [facility]   I have reviewed the note and agree with the APP's assessment as described in this progress note  Toribio Fortune, MD Gastroenterology and Hepatology [facility] Gastroenterology

## 2023-12-01 NOTE — Telephone Encounter (Signed)
 Copied from CRM [id number]. Topic: Clinical - Lab/Test Results >> Dec 01, 2023  9:11 AM Donna BRAVO wrote: Reason for CRM: Michelene  [phone] [facility] calls     PAD test  Left foot 0.92 right foot 0.89 Report will be mailed in about week   Maivs stated this is for Dr Alphonsa information

## 2023-12-06 ENCOUNTER — Encounter: Payer: Self-pay | Admitting: Adult Health

## 2023-12-06 ENCOUNTER — Ambulatory Visit (INDEPENDENT_AMBULATORY_CARE_PROVIDER_SITE_OTHER): Admitting: Adult Health

## 2023-12-06 VITALS — BP 129/77 | HR 66 | Ht 61.0 in | Wt 103.5 lb

## 2023-12-06 DIAGNOSIS — Z1331 Encounter for screening for depression: Secondary | ICD-10-CM

## 2023-12-06 DIAGNOSIS — Z90710 Acquired absence of both cervix and uterus: Secondary | ICD-10-CM

## 2023-12-06 DIAGNOSIS — L659 Nonscarring hair loss, unspecified: Secondary | ICD-10-CM | POA: Diagnosis not present

## 2023-12-06 DIAGNOSIS — Z01419 Encounter for gynecological examination (general) (routine) without abnormal findings: Secondary | ICD-10-CM

## 2023-12-06 NOTE — Progress Notes (Signed)
 Patient ID: [name], female   DOB: January 19, 1961, 63 y.o.   MRN: [id number] History of Present Illness: [name] is a 63 year old white female, divorced, sp hysterectomy in for a well woman gyn exam. She was in hospital thsi year for low blood count had had rectal bleeding and has gotten 2 iron infusions and 2 UPRBC. She has noticed her hair is thinning. She is going to see Dr Alphonsa for PCP, had seen [facility].   PCP is Dr Alphonsa   Current Medications, Allergies, Past Medical History, Past Surgical History, Family History and Social History were reviewed in [employer] electronic medical record.     Review of Systems: Patient denies any headaches, hearing loss, fatigue, blurred vision, shortness of breath, chest pain, abdominal pain, problems with bowel movements, urination, or intercourse(not active). No joint pain or mood swings.  +hair thinning   Physical Exam:BP 129/77 (BP Location: Right Arm, Patient Position: Sitting, Cuff Size: Normal)   Pulse 66   Ht 5' 1 (1.549 m)   Wt 103 lb 8 oz (46.9 kg)   BMI 19.56 kg/m   General:  Well developed, well nourished, no acute distress Skin:  Warm and dry Neck:  Midline trachea, normal thyroid , good ROM, no lymphadenopathy,no carotid bruits heard  Lungs; Clear to auscultation bilaterally Breast:  No dominant palpable mass, retraction, or nipple discharge Cardiovascular: Regular rate and rhythm Abdomen:  Soft, non tender, no hepatosplenomegaly Pelvic:  External genitalia is normal in appearance, no lesions.  The vagina is pale. Urethra has no lesions or masses. The cervix and uterus are absent.  No adnexal masses or tenderness noted.Bladder is non tender, no masses felt. Rectal: Deferred  Extremities/musculoskeletal:  No swelling or varicosities noted, no clubbing or cyanosis Psych:  No mood changes, alert and cooperative,seems happy AA is 0 Fall risk is low    12/06/2023   10:39 AM 05/20/2023   10:34 AM 04/22/2023   10:01 AM   Depression screen PHQ 2/9  Decreased Interest 0 0 0  Down, Depressed, Hopeless 0 0 0  PHQ - 2 Score 0 0 0  Altered sleeping 3    Tired, decreased energy 0    Change in appetite 1    Feeling bad or failure about yourself  0    Trouble concentrating 0    Moving slowly or fidgety/restless 0    Suicidal thoughts 0    PHQ-9 Score 4         12/06/2023   10:40 AM 06/02/2022   10:47 AM  GAD 7 : Generalized Anxiety Score  Nervous, Anxious, on Edge 0 1  Control/stop worrying 0 0  Worry too much - different things 0 0  Trouble relaxing 0 0  Restless 0 0  Easily annoyed or irritable 0 0  Afraid - awful might happen 0 1  Total GAD 7 Score 0 2      Upstream - 12/06/23 1049       Pregnancy Intention Screening   Does the patient want to become pregnant in the next year? N/A    Does the patient's partner want to become pregnant in the next year? N/A    Would the patient like to discuss contraceptive options today? N/A      Contraception Wrap Up   Current Method Abstinence;Female Sterilization   hyst   End Method Abstinence;Female Sterilization   hyst   Contraception Counseling Provided No         Examination chaperoned by Clarita  Young LPN  Impression and plan: 1. Encounter for well woman exam with routine gynecological exam (Primary) Physical in 1 year Labs with GI Mammogram was negative 07/22/23 Colonoscopy per GI   2. S/P hysterectomy  3. Hair thinning Will refer to dermatology if desired, she will let me know

## 2023-12-08 ENCOUNTER — Ambulatory Visit (HOSPITAL_COMMUNITY)
Admission: RE | Admit: 2023-12-08 | Discharge: 2023-12-08 | Disposition: A | Discharge status: Home / Self Care | Source: Ambulatory Visit | Attending: Gastroenterology | Admitting: Gastroenterology

## 2023-12-08 DIAGNOSIS — K7469 Other cirrhosis of liver: Secondary | ICD-10-CM | POA: Insufficient documentation

## 2023-12-08 LAB — CBC
HCT: 37.6 % (ref 35.0–45.0)
Hemoglobin: 12.5 g/dL (ref 11.7–15.5)
MCH: 33.1 pg — ABNORMAL HIGH (ref 27.0–33.0)
MCHC: 33.2 g/dL (ref 32.0–36.0)
MCV: 99.5 fL (ref 80.0–100.0)
MPV: 11.5 fL (ref 7.5–12.5)
Platelets: 139 Thousand/uL — ABNORMAL LOW (ref 140–400)
RBC: 3.78 Million/uL — ABNORMAL LOW (ref 3.80–5.10)
RDW: 13.1 % (ref 11.0–15.0)
WBC: 6.1 Thousand/uL (ref 3.8–10.8)

## 2023-12-08 LAB — COMPREHENSIVE METABOLIC PANEL WITH GFR
AG Ratio: 1.3 (calc) (ref 1.0–2.5)
ALT: 18 U/L (ref 6–29)
AST: 25 U/L (ref 10–35)
Albumin: 4 g/dL (ref 3.6–5.1)
Alkaline phosphatase (APISO): 106 U/L (ref 37–153)
BUN: 10 mg/dL (ref 7–25)
CO2: 28 mmol/L (ref 20–32)
Calcium: 9.3 mg/dL (ref 8.6–10.4)
Chloride: 101 mmol/L (ref 98–110)
Creat: 0.8 mg/dL (ref 0.50–1.05)
Globulin: 3 g/dL (ref 1.9–3.7)
Glucose, Bld: 95 mg/dL (ref 65–99)
Potassium: 4 mmol/L (ref 3.5–5.3)
Sodium: 138 mmol/L (ref 135–146)
Total Bilirubin: 0.5 mg/dL (ref 0.2–1.2)
Total Protein: 7 g/dL (ref 6.1–8.1)
eGFR: 82 mL/min/1.73m2 (ref >=60)

## 2023-12-08 LAB — PROTIME-INR
INR: 1.1
Prothrombin Time: 11.7 s — ABNORMAL HIGH (ref 9.0–11.5)

## 2023-12-08 LAB — VITAMIN D 25 HYDROXY (VIT D DEFICIENCY, FRACTURES): Vit D, 25-Hydroxy: 45 ng/mL (ref 30–100)

## 2023-12-08 LAB — C-REACTIVE PROTEIN: CRP: 4.9 mg/L (ref <8.0)

## 2023-12-08 LAB — AFP TUMOR MARKER: AFP-Tumor Marker: 5.1 ng/mL

## 2023-12-09 ENCOUNTER — Encounter (INDEPENDENT_AMBULATORY_CARE_PROVIDER_SITE_OTHER): Payer: Self-pay

## 2023-12-13 ENCOUNTER — Ambulatory Visit (INDEPENDENT_AMBULATORY_CARE_PROVIDER_SITE_OTHER): Payer: Self-pay | Admitting: Gastroenterology

## 2023-12-15 ENCOUNTER — Other Ambulatory Visit (INDEPENDENT_AMBULATORY_CARE_PROVIDER_SITE_OTHER): Payer: Self-pay

## 2023-12-15 DIAGNOSIS — K219 Gastro-esophageal reflux disease without esophagitis: Secondary | ICD-10-CM

## 2023-12-15 MED ORDER — PANTOPRAZOLE SODIUM 40 MG PO TBEC: 40.0000 mg | DELAYED_RELEASE_TABLET | Freq: Every day | ORAL | 3 refills | Status: DC

## 2023-12-23 LAB — CALPROTECTIN: Calprotectin: 73 ug/g

## 2024-01-04 ENCOUNTER — Encounter: Payer: Self-pay | Admitting: Physician Assistant

## 2024-01-04 ENCOUNTER — Ambulatory Visit: Admitting: Physician Assistant

## 2024-01-04 VITALS — BP 118/64 | HR 67 | Temp 97.3°F | Ht 61.0 in | Wt 103.6 lb

## 2024-01-04 DIAGNOSIS — R221 Localized swelling, mass and lump, neck: Secondary | ICD-10-CM | POA: Diagnosis not present

## 2024-01-04 DIAGNOSIS — K5000 Crohn's disease of small intestine without complications: Secondary | ICD-10-CM

## 2024-01-04 DIAGNOSIS — R600 Localized edema: Secondary | ICD-10-CM | POA: Insufficient documentation

## 2024-01-04 DIAGNOSIS — Z7689 Persons encountering health services in other specified circumstances: Secondary | ICD-10-CM

## 2024-01-04 NOTE — Assessment & Plan Note (Signed)
 Patient presents today with a nodule on her anterior neck for 1 month, non-painful, slight growth. Nodule palpated on exam. Moves with swallowing. Considering hair thinning, frequent feelings of cold, and neck nodule with proceed with thyroid  labs and neck/thyroid  US . Discussed warning signs and follow up precautions.

## 2024-01-04 NOTE — Assessment & Plan Note (Signed)
 Intermittent evening edema, resolves by morning. No pain, skin changes, or systemic symptoms. No leg swelling on exam today. Recent labs normal, no cardiac signs. Advise to report if swelling becomes persistent, painful, or associated with chest pain or shortness of breath.

## 2024-01-04 NOTE — Progress Notes (Signed)
 New Patient Office Visit  Subjective    Patient ID: [name], female    DOB: 1960-12-12  Age: 63 y.o. MRN: [id number]  CC:  Chief Complaint  Patient presents with   Establish Care    Pt had fall 2 days ago and fell on right elbow. Pt declines flu vaccine today.     HPI [name] presents to establish care  Discussed the use of AI scribe software for clinical note transcription with the patient, who gave verbal consent to proceed.  History of Present Illness [name] is a 63 year old female with Crohn's disease and rheumatoid arthritis who presents with a neck nodule and leg swelling.  A nodule on the front of her neck was noticed about a month ago. It has grown slightly but is not painful. She frequently feels cold and has thinning hair. There are no tremors or heart palpitations.  She experiences swelling in her legs and feet, primarily in the evenings after prolonged standing, which resolves by morning. There is no associated pain or skin color changes. Recent CMP was within normal limits.   She experienced significant weight loss earlier this year, dropping to 96 pounds, and is currently struggling to regain weight, now at 103 pounds. She was previously on mercaptopurine , which was discontinued due to concerns about liver cirrhosis, and has also used Humara in the past.  She has rheumatoid arthritis and osteoporosis, causing significant morning stiffness and pain, particularly in the neck and wrist. She experiences chronic sleep issues, often getting only three hours of sleep per night, and has been prescribed trazodone for sleep but dislikes the grogginess it causes. She follows with [facility] for pain management.     Outpatient Encounter Medications as of 01/04/2024  Medication Sig   alendronate (FOSAMAX) 70 MG tablet Take 70 mg by mouth once a week.   carvedilol  (COREG ) 3.125 MG tablet TAKE ONE TABLET BY MOUTH TWICE DAILY WITH APPLY MEAL   famotidine   (PEPCID ) 20 MG tablet Take 1 tablet (20 mg total) by mouth daily.   Magnesium 200 MG TABS Take by mouth.   Multiple Vitamin (MULTIVITAMIN WITH MINERALS) TABS tablet Take 1 tablet by mouth daily.   ondansetron  (ZOFRAN ) 4 MG tablet TAKE (1) TABLET BY MOUTH EVERY EIGHT HOURS AS NEEDED FOR NAUSEA OR VOMITING.   OXYCONTIN  20 MG 12 hr tablet Take 20 mg by mouth 2 (two) times daily.   pantoprazole  (PROTONIX ) 40 MG tablet Take 1 tablet (40 mg total) by mouth daily.   [name]  360 MG/2.4ML SOCT by Intravenous (Continuous Infusion) route every 3 (three) months.   Turmeric (QC TUMERIC COMPLEX PO) Take by mouth daily at 6 (six) AM.   Vitamin D , Ergocalciferol , (DRISDOL) 1.25 MG (50000 UNIT) CAPS capsule Take 50,000 Units by mouth once a week.   [DISCONTINUED] Ferrous Sulfate (IRON PO) Take by mouth. (Patient not taking: Reported on 01/04/2024)   No facility-administered encounter medications on file as of 01/04/2024.    Past Medical History:  Diagnosis Date   Anemia    Anorexia 10/01/2010   Arthritis    Chronic pain syndrome    bones and stomach   COPD (chronic obstructive pulmonary disease) (HCC)    Crohn's 10/01/2010   Diarrhea 10/01/2010   Emesis 10/01/2010   GERD (gastroesophageal reflux disease)    Lower abdominal pain 10/01/2010   Pancreatitis    Rectal bleeding 10/01/2010   Seasonal allergies     Past Surgical History:  Procedure  Laterality Date    Partial Hysterectomy  about 20 years ago   BIOPSY  06/23/2021   Procedure: BIOPSY;  Surgeon: Eartha Angelia Sieving, MD;  [facility];  Service: Gastroenterology;;   BIOPSY  03/08/2023   Procedure: BIOPSY;  Surgeon: Eartha Angelia Sieving, MD;  [facility];  Service: Gastroenterology;;   CHOLECYSTECTOMY     COLECTOMY  1995   x2   COLONOSCOPY  01/17/2006   October 2007 revealinga ileal ulcers proximal to anastomosis without stricture and a single rectal ulcer.   COLONOSCOPY WITH PROPOFOL  N/A 04/02/2016    Procedure: COLONOSCOPY WITH PROPOFOL ;  Surgeon: Claudis RAYMOND Rivet, MD;  Location: [facility];  Service: Endoscopy;  Laterality: N/A;  1:00   COLONOSCOPY WITH PROPOFOL  N/A 06/23/2021   Procedure: COLONOSCOPY WITH PROPOFOL ;  Surgeon: Eartha Angelia Sieving, MD;  [facility];  Service: Gastroenterology;  Laterality: N/A;  805 ASA 1   COLONOSCOPY WITH PROPOFOL  N/A 03/08/2023   Procedure: COLONOSCOPY WITH PROPOFOL ;  Surgeon: Eartha Angelia Sieving, MD;  [facility];  Service: Gastroenterology;  Laterality: N/A;  1:45PM;ASA 3   ESOPHAGOGASTRODUODENOSCOPY (EGD) WITH PROPOFOL  N/A 11/21/2013   Procedure: ESOPHAGOGASTRODUODENOSCOPY (EGD) WITH PROPOFOL ;  Surgeon: Claudis RAYMOND Rivet, MD;  Location: [facility];  Service: Endoscopy;  Laterality: N/A;   ESOPHAGOGASTRODUODENOSCOPY (EGD) WITH PROPOFOL  N/A 03/08/2023   Procedure: ESOPHAGOGASTRODUODENOSCOPY (EGD) WITH PROPOFOL ;  Surgeon: Eartha Angelia Sieving, MD;  [facility];  Service: Gastroenterology;  Laterality: N/A;   HERNIA REPAIR     TOOTH EXTRACTION Left 10/17/2013   left lower molar    Family History  Problem Relation Age of Onset   Dementia Maternal Grandmother    Cancer Maternal Grandmother        breast   Heart disease Father    Heart attack Father    Dementia Mother    Heart attack Mother        x 2   Diabetes Sister    Healthy Daughter    Healthy Son    Colon cancer Neg Hx    Colon polyps Neg Hx     Social History   Socioeconomic History   Marital status: Divorced    Spouse name: Not on file   Number of children: 2   Years of education: Not on file   Highest education level: 10th grade  Occupational History   Occupation: disabled  Tobacco Use   Smoking status: Former    Current packs/day: 0.00    Average packs/day: 0.3 packs/day for 35.0 years (8.8 ttl pk-yrs)    Types: Cigarettes    Start date: 11/28/1980    Quit date: 11/29/2015    Years since quitting: 8.1    Passive exposure: Past    Smokeless tobacco: Never  Vaping Use   Vaping status: Never Used  Substance and Sexual Activity   Alcohol use: No   Drug use: No   Sexual activity: Not Currently    Birth control/protection: Surgical    Comment: hyst  Other Topics Concern   Not on file  Social History Narrative   Not on file   Social Drivers of Health   Financial Resource Strain: Low Risk  (12/06/2023)   Overall Financial Resource Strain (CARDIA)    Difficulty of Paying Living Expenses: Not very hard  Food Insecurity: No Food Insecurity (12/06/2023)   Hunger Vital Sign    Worried About Running Out of Food in the Last Year: Never true    Ran Out  of Food in the Last Year: Never true  Recent Concern: Food Insecurity - Food Insecurity Present (10/21/2023)   Hunger Vital Sign    Worried About Running Out of Food in the Last Year: Patient declined    Ran Out of Food in the Last Year: Sometimes true  Transportation Needs: No Transportation Needs (12/06/2023)   PRAPARE - Administrator, Civil Service (Medical): No    Lack of Transportation (Non-Medical): No  Physical Activity: Insufficiently Active (12/06/2023)   Exercise Vital Sign    Days of Exercise per Week: 3 days    Minutes of Exercise per Session: 20 min  Stress: Stress Concern Present (12/06/2023)   [employer] of Occupational Health - Occupational Stress Questionnaire    Feeling of Stress: To some extent  Social Connections: Moderately Integrated (12/06/2023)   Social Connection and Isolation Panel    Frequency of Communication with Friends and Family: More than three times a week    Frequency of Social Gatherings with Friends and Family: Three times a week    Attends Religious Services: More than 4 times per year    Active Member of Clubs or Organizations: Yes    Attends Banker Meetings: More than 4 times per year    Marital Status: Divorced  Intimate Partner Violence: Not At Risk (12/06/2023)   Humiliation, Afraid, Rape, and  Kick questionnaire    Fear of Current or Ex-Partner: No    Emotionally Abused: No    Physically Abused: No    Sexually Abused: No    Review of Systems  Constitutional:  Negative for chills, fever and malaise/fatigue.  Eyes:  Negative for blurred vision and double vision.  Respiratory:  Negative for cough and shortness of breath.   Cardiovascular:  Positive for leg swelling. Negative for chest pain and palpitations.  Musculoskeletal:  Positive for back pain, falls and joint pain. Negative for myalgias.  Neurological:  Negative for dizziness and headaches.        Objective    BP 118/64   Pulse 67   Temp (!) 97.3 F (36.3 C)   Ht 5' 1 (1.549 m)   Wt 103 lb 9.6 oz (47 kg)   SpO2 99%   BMI 19.58 kg/m   Physical Exam Constitutional:      Appearance: Normal appearance. She is normal weight.  HENT:     Head: Normocephalic and atraumatic.     Mouth/Throat:     Mouth: Mucous membranes are moist.     Pharynx: Oropharynx is clear.  Eyes:     Extraocular Movements: Extraocular movements intact.     Conjunctiva/sclera: Conjunctivae normal.  Neck:     Thyroid : Thyroid  mass present.   Cardiovascular:     Rate and Rhythm: Normal rate and regular rhythm.     Heart sounds: Normal heart sounds. No murmur heard. Pulmonary:     Effort: Pulmonary effort is normal.     Breath sounds: Normal breath sounds. No wheezing or rales.  Musculoskeletal:        General: No tenderness.     Cervical back: Normal range of motion and neck supple.     Right lower leg: No edema.     Left lower leg: No edema.  Skin:    General: Skin is warm and dry.     Findings: No bruising or erythema.  Neurological:     General: No focal deficit present.     Mental Status: She is alert and oriented to  person, place, and time.  Psychiatric:        Mood and Affect: Mood normal.        Behavior: Behavior normal.       Assessment & Plan:  Encounter to establish care  Nodule of neck Assessment &  Plan: Patient presents today with a nodule on her anterior neck for 1 month, non-painful, slight growth. Nodule palpated on exam. Moves with swallowing. Considering hair thinning, frequent feelings of cold, and neck nodule with proceed with thyroid  labs and neck/thyroid  US . Discussed warning signs and follow up precautions.   Orders: -     TSH + free T4 -     T3, free -     US  THYROID   Lower leg edema Assessment & Plan: Intermittent evening edema, resolves by morning. No pain, skin changes, or systemic symptoms. No leg swelling on exam today. Recent labs normal, no cardiac signs. Advise to report if swelling becomes persistent, painful, or associated with chest pain or shortness of breath.   Crohn's disease involving terminal ileum (HCC) Assessment & Plan: Stable. Continue following with GI for Skyrzi infusions.     Return in about 6 months (around 07/03/2024).   Charmaine Edy Mcbane, PA-C

## 2024-01-04 NOTE — Assessment & Plan Note (Signed)
 Stable. Continue following with GI for Skyrzi infusions.

## 2024-01-05 ENCOUNTER — Ambulatory Visit: Payer: Self-pay | Admitting: Physician Assistant

## 2024-01-05 LAB — TSH+FREE T4
Free T4: 0.87 ng/dL (ref 0.82–1.77)
TSH: 0.92 u[IU]/mL (ref 0.450–4.500)

## 2024-01-05 LAB — T3, FREE: T3, Free: 2.4 pg/mL (ref 2.0–4.4)

## 2024-01-11 ENCOUNTER — Ambulatory Visit (HOSPITAL_COMMUNITY)

## 2024-01-18 ENCOUNTER — Ambulatory Visit (HOSPITAL_COMMUNITY)

## 2024-01-25 ENCOUNTER — Ambulatory Visit (HOSPITAL_COMMUNITY)
Admission: RE | Admit: 2024-01-25 | Discharge: 2024-01-25 | Disposition: A | Discharge status: Home / Self Care | Source: Ambulatory Visit | Attending: Physician Assistant | Admitting: Physician Assistant

## 2024-01-25 DIAGNOSIS — R221 Localized swelling, mass and lump, neck: Secondary | ICD-10-CM | POA: Diagnosis present

## 2024-01-31 ENCOUNTER — Other Ambulatory Visit: Payer: Self-pay | Admitting: Physician Assistant

## 2024-01-31 DIAGNOSIS — R221 Localized swelling, mass and lump, neck: Secondary | ICD-10-CM

## 2024-02-01 ENCOUNTER — Encounter (INDEPENDENT_AMBULATORY_CARE_PROVIDER_SITE_OTHER): Payer: Self-pay | Admitting: Gastroenterology

## 2024-02-10 ENCOUNTER — Ambulatory Visit (HOSPITAL_COMMUNITY): Admission: RE | Admit: 2024-02-10 | Source: Ambulatory Visit

## 2024-02-10 ENCOUNTER — Encounter (HOSPITAL_COMMUNITY): Payer: Self-pay

## 2024-02-16 ENCOUNTER — Encounter (INDEPENDENT_AMBULATORY_CARE_PROVIDER_SITE_OTHER): Payer: Self-pay | Admitting: Gastroenterology

## 2024-02-16 ENCOUNTER — Other Ambulatory Visit (HOSPITAL_COMMUNITY): Payer: Self-pay | Admitting: Pain Medicine

## 2024-02-16 DIAGNOSIS — M5412 Radiculopathy, cervical region: Secondary | ICD-10-CM

## 2024-02-17 ENCOUNTER — Ambulatory Visit (HOSPITAL_COMMUNITY): Admission: RE | Admit: 2024-02-17 | Source: Ambulatory Visit

## 2024-02-20 ENCOUNTER — Encounter (HOSPITAL_COMMUNITY): Payer: Self-pay

## 2024-02-20 ENCOUNTER — Ambulatory Visit (HOSPITAL_COMMUNITY)
Admission: RE | Admit: 2024-02-20 | Discharge: 2024-02-20 | Disposition: A | Source: Ambulatory Visit | Attending: Physician Assistant

## 2024-02-20 ENCOUNTER — Ambulatory Visit (HOSPITAL_COMMUNITY)
Admission: RE | Admit: 2024-02-20 | Discharge: 2024-02-20 | Disposition: A | Discharge status: Home / Self Care | Source: Ambulatory Visit | Attending: Pain Medicine | Admitting: Pain Medicine

## 2024-02-20 DIAGNOSIS — M47812 Spondylosis without myelopathy or radiculopathy, cervical region: Secondary | ICD-10-CM | POA: Diagnosis not present

## 2024-02-20 DIAGNOSIS — M542 Cervicalgia: Secondary | ICD-10-CM | POA: Diagnosis present

## 2024-02-20 DIAGNOSIS — M5412 Radiculopathy, cervical region: Secondary | ICD-10-CM | POA: Diagnosis present

## 2024-02-20 DIAGNOSIS — R221 Localized swelling, mass and lump, neck: Secondary | ICD-10-CM | POA: Insufficient documentation

## 2024-02-20 MED ORDER — LIDOCAINE HCL (PF) 2 % IJ SOLN
10.0000 mL | Freq: Once | INTRAMUSCULAR | Status: AC
  Administered 2024-02-20: 10 mL

## 2024-02-20 NOTE — Progress Notes (Signed)
 PT tolerated thyroid biopsy procedure well today. Labs and afirma obtained and sent for pathology by Colette from ultrasound. PT ambulatory at discharge with no acute distress noted and verbalized understanding of discharge instructions.

## 2024-02-23 LAB — CYTOLOGY - NON PAP

## 2024-02-24 ENCOUNTER — Telehealth: Payer: Self-pay | Admitting: Physician Assistant

## 2024-02-24 NOTE — Telephone Encounter (Unsigned)
 Copied from CRM #[id number]. Topic: Clinical - Lab/Test Results >> Feb 24, 2024  1:53 PM Tiffini S wrote: Reason for CRM: Patient called to follow up about the results for palpable swelling in the right upper neck Please call the patient back at [phone] to go over results from biopsy

## 2024-02-27 ENCOUNTER — Ambulatory Visit: Payer: Self-pay | Admitting: Physician Assistant

## 2024-03-13 ENCOUNTER — Other Ambulatory Visit (INDEPENDENT_AMBULATORY_CARE_PROVIDER_SITE_OTHER): Payer: Self-pay

## 2024-03-13 DIAGNOSIS — R112 Nausea with vomiting, unspecified: Secondary | ICD-10-CM

## 2024-03-13 MED ORDER — ONDANSETRON HCL 4 MG PO TABS: ORAL_TABLET | ORAL | 0 refills | Status: AC

## 2024-03-30 ENCOUNTER — Ambulatory Visit

## 2024-03-30 VITALS — Ht 61.0 in | Wt 103.0 lb

## 2024-03-30 DIAGNOSIS — Z0000 Encounter for general adult medical examination without abnormal findings: Secondary | ICD-10-CM

## 2024-03-30 NOTE — Patient Instructions (Signed)
 Ms. Rison,  Thank you for taking the time for your Medicare Wellness Visit. I appreciate your continued commitment to your health goals. Please review the care plan we discussed, and feel free to reach out if I can assist you further.  Please note that Annual Wellness Visits do not include a physical exam. Some assessments may be limited, especially if the visit was conducted virtually. If needed, we may recommend an in-person follow-up with your provider.  Ongoing Care Seeing your primary care provider every 3 to 6 months helps us  monitor your health and provide consistent, personalized care.   Referrals If a referral was made during today's visit and you haven't received any updates within two weeks, please contact the referred provider directly to check on the status.  Recommended Screenings:  Health Maintenance  Topic Date Due   DTaP/Tdap/Td vaccine (1 - Tdap) Never done   Pneumococcal Vaccine for age over 26 (1 of 2 - PCV) Never done   Pap with HPV screening  Never done   Zoster (Shingles) Vaccine (1 of 2) Never done   Hepatitis B Vaccine (1 of 3 - Risk 3-dose series) Never done   COVID-19 Vaccine (1 - 2025-26 season) Never done   Flu Shot  07/17/2024*   Medicare Annual Wellness Visit  03/30/2025   Breast Cancer Screening  07/21/2025   Colon Cancer Screening  03/07/2033   Hepatitis C Screening  Completed   HIV Screening  Completed   HPV Vaccine  Aged Out   Meningitis B Vaccine  Aged Out  *Topic was postponed. The date shown is not the original due date.       03/23/2024    8:23 AM  Advanced Directives  Does Patient Have a Medical Advance Directive? No  Would patient like information on creating a medical advance directive? Yes (MAU/Ambulatory/Procedural Areas - Information given)   Information on Advanced Care Planning can be found at [place]  Secretary of [facility] Advance Health Care Directives Advance Health Care Directives ([email or web address]   Vision: Annual vision  screenings are recommended for early detection of glaucoma, cataracts, and diabetic retinopathy. These exams can also reveal signs of chronic conditions such as diabetes and high blood pressure.  Dental: Annual dental screenings help detect early signs of oral cancer, gum disease, and other conditions linked to overall health, including heart disease and diabetes.  Please see the attached documents for additional preventive care recommendations.

## 2024-03-30 NOTE — Progress Notes (Signed)
 Chief Complaint  Patient presents with   Medicare Wellness     Subjective:   [name] is a 63 y.o. female who presents for a Medicare Annual Wellness Visit.  Visit info / Clinical Intake: Medicare Wellness Visit Type:: Subsequent Annual Wellness Visit Persons participating in visit and providing information:: patient Medicare Wellness Visit Mode:: Telephone If telephone:: video declined Since this visit was completed virtually, some vitals may be partially provided or unavailable. Missing vitals are due to the limitations of the virtual format.: Documented vitals are patient reported If Telephone or Video please confirm:: I connected with patient using audio/video enable telemedicine. I verified patient identity with two identifiers, discussed telehealth limitations, and patient agreed to proceed. Patient Location:: home Provider Location:: office Interpreter Needed?: No Pre-visit prep was completed: yes AWV questionnaire completed by patient prior to visit?: yes Date:: 03/23/24 Living arrangements:: (!) (Patient-Rptd) lives alone Patient's Overall Health Status Rating: (!) (Patient-Rptd) fair Typical amount of pain: (!) (Patient-Rptd) a lot Does pain affect daily life?: (!) (Patient-Rptd) yes Are you currently prescribed opioids?: (!) yes  Dietary Habits and Nutritional Risks How many meals a day?: (Patient-Rptd) 2 Eats fruit and vegetables daily?: (Patient-Rptd) yes Most meals are obtained by: (Patient-Rptd) preparing own meals In the last 2 weeks, have you had any of the following?: none Diabetic:: no  Functional Status Activities of Daily Living (to include ambulation/medication): (Patient-Rptd) Independent Ambulation: (Patient-Rptd) Independent Medication Administration: (Patient-Rptd) Independent Home Management (perform basic housework or laundry): (Patient-Rptd) Independent Manage your own finances?: (Patient-Rptd) yes Primary transportation is: (Patient-Rptd)  driving Concerns about vision?: no *vision screening is required for WTM* Concerns about hearing?: no  Fall Screening Falls in the past year?: (Patient-Rptd) 0 Number of falls in past year: 0 Was there an injury with Fall?: 0 Fall Risk Category Calculator: 0 Patient Fall Risk Level: Low Fall Risk  Fall Risk Patient at Risk for Falls Due to: Impaired mobility Fall risk Follow up: Education provided; Falls prevention discussed; Falls evaluation completed  Home and Transportation Safety: All rugs have non-skid backing?: (Patient-Rptd) yes All stairs or steps have railings?: (Patient-Rptd) N/A, no stairs Grab bars in the bathtub or shower?: (!) (Patient-Rptd) no Have non-skid surface in bathtub or shower?: (!) (Patient-Rptd) no Good home lighting?: (Patient-Rptd) yes Regular seat belt use?: (Patient-Rptd) yes Hospital stays in the last year:: (!) (Patient-Rptd) yes How many hospital stays:: (Patient-Rptd) 1  Cognitive Assessment Difficulty concentrating, remembering, or making decisions? : (Patient-Rptd) no Will 6CIT or Mini Cog be Completed: no 6CIT or Mini Cog Declined: patient alert, oriented, able to answer questions appropriately and recall recent events  Advance Directives (For Healthcare) Does Patient Have a Medical Advance Directive?: No Does patient want to make changes to medical advance directive?: No - Patient declined Type of Advance Directive: Healthcare Power of [place]; Living will Copy of Healthcare Power of Attorney in Chart?: No - copy requested Copy of Living Will in Chart?: No - copy requested Would patient like information on creating a medical advance directive?: Yes (MAU/Ambulatory/Procedural Areas - Information given)  Reviewed/Updated  Reviewed/Updated: Reviewed All (Medical, Surgical, Family, Medications, Allergies, Care Teams, Patient Goals)    Allergies (verified) Patient has no known allergies.   Current Medications (verified) Outpatient  Encounter Medications as of 03/30/2024  Medication Sig   alendronate (FOSAMAX) 70 MG tablet Take 70 mg by mouth once a week.   carvedilol  (COREG ) 3.125 MG tablet TAKE ONE TABLET BY MOUTH TWICE DAILY WITH APPLY MEAL   famotidine  (PEPCID )  20 MG tablet Take 1 tablet (20 mg total) by mouth daily.   Magnesium 200 MG TABS Take by mouth.   Multiple Vitamin (MULTIVITAMIN WITH MINERALS) TABS tablet Take 1 tablet by mouth daily.   ondansetron  (ZOFRAN ) 4 MG tablet Take one (1) tablet po q 8 hrs pen nausea or vomiting.   oxyCODONE -acetaminophen  (PERCOCET) 10-325 MG tablet Take 1 tablet by mouth every 6 (six) hours as needed.   pantoprazole  (PROTONIX ) 40 MG tablet Take 1 tablet (40 mg total) by mouth daily.   [name]  360 MG/2.4ML SOCT by Intravenous (Continuous Infusion) route every 3 (three) months.   Turmeric (QC TUMERIC COMPLEX PO) Take by mouth daily at 6 (six) AM.   Vitamin D , Ergocalciferol , (DRISDOL) 1.25 MG (50000 UNIT) CAPS capsule Take 50,000 Units by mouth once a week.   [DISCONTINUED] OXYCONTIN  20 MG 12 hr tablet Take 20 mg by mouth 2 (two) times daily.   No facility-administered encounter medications on file as of 03/30/2024.    History: Past Medical History:  Diagnosis Date   Anemia    Anorexia 10/01/2010   Anxiety Mid 20's   Arthritis    Blood transfusion without reported diagnosis 05/2023   Chronic pain syndrome    bones and stomach   COPD (chronic obstructive pulmonary disease) (HCC)    Crohn's 10/01/2010   Diarrhea 10/01/2010   Emesis 10/01/2010   GERD (gastroesophageal reflux disease)    Lower abdominal pain 10/01/2010   Osteoporosis Early 30's   Pancreatitis    Rectal bleeding 10/01/2010   Seasonal allergies    Past Surgical History:  Procedure Laterality Date    Partial Hysterectomy  about 20 years ago   ABDOMINAL HYSTERECTOMY  Mis 2000   Partial   BIOPSY  06/23/2021   Procedure: BIOPSY;  Surgeon: Eartha Angelia Sieving, MD;  [facility];  Service:  Gastroenterology;;   BIOPSY  03/08/2023   Procedure: BIOPSY;  Surgeon: Eartha Angelia Sieving, MD;  [facility];  Service: Gastroenterology;;   CHOLECYSTECTOMY     COLECTOMY  1995   x2   COLONOSCOPY  01/17/2006   October 2007 revealinga ileal ulcers proximal to anastomosis without stricture and a single rectal ulcer.   COLONOSCOPY WITH PROPOFOL  N/A 04/02/2016   Procedure: COLONOSCOPY WITH PROPOFOL ;  Surgeon: Claudis RAYMOND Rivet, MD;  [facility];  Service: Endoscopy;  Laterality: N/A;  1:00   COLONOSCOPY WITH PROPOFOL  N/A 06/23/2021   Procedure: COLONOSCOPY WITH PROPOFOL ;  Surgeon: Eartha Angelia Sieving, MD;  [facility];  Service: Gastroenterology;  Laterality: N/A;  805 ASA 1   COLONOSCOPY WITH PROPOFOL  N/A 03/08/2023   Procedure: COLONOSCOPY WITH PROPOFOL ;  Surgeon: Eartha Angelia Sieving, MD;  [facility];  Service: Gastroenterology;  Laterality: N/A;  1:45PM;ASA 3   ESOPHAGOGASTRODUODENOSCOPY (EGD) WITH PROPOFOL  N/A 11/21/2013   Procedure: ESOPHAGOGASTRODUODENOSCOPY (EGD) WITH PROPOFOL ;  Surgeon: Claudis RAYMOND Rivet, MD;  Location: [facility];  Service: Endoscopy;  Laterality: N/A;   ESOPHAGOGASTRODUODENOSCOPY (EGD) WITH PROPOFOL  N/A 03/08/2023   Procedure: ESOPHAGOGASTRODUODENOSCOPY (EGD) WITH PROPOFOL ;  Surgeon: Eartha Angelia Sieving, MD;  [facility];  Service: Gastroenterology;  Laterality: N/A;   HERNIA REPAIR     SMALL INTESTINE SURGERY  90's   TOOTH EXTRACTION Left 10/17/2013   left lower molar   TUBAL LIGATION  Late 80's   Family History  Problem Relation Age of Onset   Dementia Maternal Grandmother    Cancer Maternal Grandmother        breast  Heart disease Father    Heart attack Father    Dementia Mother    Heart attack Mother        x 2   Alcohol abuse Mother    Depression Mother    Anxiety disorder Mother    Arthritis Mother    Diabetes Brother    Diabetes Sister    Healthy Daughter    Healthy Son     Early death Brother    Colon cancer Neg Hx    Colon polyps Neg Hx    Social History   Occupational History   Occupation: disabled  Tobacco Use   Smoking status: Former    Current packs/day: 0.00    Average packs/day: 0.4 packs/day for 41.2 years (15.0 ttl pk-yrs)    Types: Cigarettes    Start date: 11/28/1980    Quit date: 11/29/2015    Years since quitting: 8.3    Passive exposure: Past   Smokeless tobacco: Never  Vaping Use   Vaping status: Never Used  Substance and Sexual Activity   Alcohol use: Never   Drug use: Never   Sexual activity: Not Currently    Birth control/protection: Abstinence, None    Comment: hyst   Tobacco Counseling Counseling given: Not Answered  SDOH Screenings   Food Insecurity: No Food Insecurity (03/23/2024)  Housing: Low Risk (03/23/2024)  Transportation Needs: No Transportation Needs (03/23/2024)  Utilities: Not At Risk (03/30/2024)  Alcohol Screen: Low Risk (12/06/2023)  Depression (PHQ2-9): Low Risk (03/30/2024)  Recent Concern: Depression (PHQ2-9) - Medium Risk (01/04/2024)  Financial Resource Strain: Low Risk (03/23/2024)  Physical Activity: Insufficiently Active (03/23/2024)  Social Connections: Moderately Integrated (03/23/2024)  Stress: Stress Concern Present (03/23/2024)  Tobacco Use: Medium Risk (03/30/2024)  Health Literacy: Adequate Health Literacy (03/30/2024)   See flowsheets for full screening details  Depression Screen PHQ 2 & 9 Depression Scale- Over the past 2 weeks, how often have you been bothered by any of the following problems? Little interest or pleasure in doing things: 0 Feeling down, depressed, or hopeless (PHQ Adolescent also includes...irritable): 0 PHQ-2 Total Score: 0 Trouble falling or staying asleep, or sleeping too much: 3 Feeling tired or having little energy: 2 Poor appetite or overeating (PHQ Adolescent also includes...weight loss): 2 Feeling bad about yourself - or that you are a failure or have let  yourself or your family down: 0 Trouble concentrating on things, such as reading the newspaper or watching television (PHQ Adolescent also includes...like school work): 0 Moving or speaking so slowly that other people could have noticed. Or the opposite - being so fidgety or restless that you have been moving around a lot more than usual: 0 Thoughts that you would be better off dead, or of hurting yourself in some way: 0 PHQ-9 Total Score: 7     Goals Addressed             This Visit's Progress    Maintain health and independence   On track            Objective:    Today's Vitals   03/30/24 0913  Weight: 103 lb (46.7 kg)  Height: 5' 1 (1.549 m)   Body mass index is 19.46 kg/m.  Hearing/Vision screen Hearing Screening - Comments:: Patient is able to hear conversational tones without difficulty. No issues reported.   Vision Screening - Comments:: Wears rx glasses - up to date with routine eye exams with [facility]  Immunizations and Health Maintenance Health Maintenance  Topic  Date Due   DTaP/Tdap/Td (1 - Tdap) Never done   Pneumococcal Vaccine: 50+ Years (1 of 2 - PCV) Never done   Cervical Cancer Screening (HPV/Pap Cotest)  Never done   Zoster Vaccines- Shingrix (1 of 2) Never done   Hepatitis B Vaccines 19-59 Average Risk (1 of 3 - Risk 3-dose series) Never done   COVID-19 Vaccine (1 - 2025-26 season) Never done   Influenza Vaccine  07/17/2024 (Originally 11/18/2023)   Medicare Annual Wellness (AWV)  03/30/2025   Mammogram  07/21/2025   Colonoscopy  03/07/2033   Hepatitis C Screening  Completed   HIV Screening  Completed   HPV VACCINES  Aged Out   Meningococcal B Vaccine  Aged Out        Assessment/Plan:  This is a routine wellness examination for [name].  Patient Care Team: Grooms, Charmaine, [place] as PCP - General (Physician Assistant) Eartha Flavors, Tor[place], MD as Consulting Physician (Gastroenterology) Signa Delon LABOR, NP as Nurse Practitioner  (Obstetrics and Gynecology) Davonna Siad, MD as Consulting Physician (Oncology) Bryant, Allyson Kosterman, MD as Referring Physician (Pain Medicine) [facility]  I have personally reviewed and noted the following in the patients chart:   Medical and social history Use of alcohol, tobacco or illicit drugs  Current medications and supplements including opioid prescriptions. Functional ability and status Nutritional status Physical activity Advanced directives List of other physicians Hospitalizations, surgeries, and ER visits in previous 12 months Vitals Screenings to include cognitive, depression, and falls Referrals and appointments  No orders of the defined types were placed in this encounter.  In addition, I have reviewed and discussed with patient certain preventive protocols, quality metrics, and best practice recommendations. A written personalized care plan for preventive services as well as general preventive health recommendations were provided to patient.   Lavelle Charmaine Browner, LPN   [id number]   Return in 1 year (on 03/30/2025).  After Visit Summary: (MyChart) Due to this being a telephonic visit, the after visit summary with patients personalized plan was offered to patient via MyChart   Nurse Notes: Vaccines not given: All vaccines declined today

## 2024-04-11 ENCOUNTER — Other Ambulatory Visit (INDEPENDENT_AMBULATORY_CARE_PROVIDER_SITE_OTHER): Payer: Self-pay | Admitting: Gastroenterology

## 2024-04-11 DIAGNOSIS — K219 Gastro-esophageal reflux disease without esophagitis: Secondary | ICD-10-CM

## 2024-04-19 ENCOUNTER — Other Ambulatory Visit (INDEPENDENT_AMBULATORY_CARE_PROVIDER_SITE_OTHER): Payer: Self-pay | Admitting: Gastroenterology

## 2024-04-20 ENCOUNTER — Telehealth (INDEPENDENT_AMBULATORY_CARE_PROVIDER_SITE_OTHER): Payer: Self-pay

## 2024-04-20 NOTE — Telephone Encounter (Signed)
 [facility], there is a Rx request for this in your in box.

## 2024-04-20 NOTE — Telephone Encounter (Signed)
 Received a PA on patient [name]  and called the patient to see how she is taking this. Patient says she does not want this filled. She took her last on body infusion on 03/21/2024 and not due for another until February. Patient wants to discuss changing her biologic to something else, as she feels she is not having a good out come with this. She says she was on mercaptopurine , the changed to Humira  and then changed to [name] . She says since being on [name]  she has had issues with her skin, she stays nauseated and occasionally vomits. She is having hair loss. I transferred the patient to the front to get an appointment with a provider to discuss changing her biologics.

## 2024-04-23 ENCOUNTER — Encounter: Payer: Self-pay | Admitting: Oncology

## 2024-04-24 ENCOUNTER — Encounter: Payer: Self-pay | Admitting: Oncology

## 2024-04-30 ENCOUNTER — Ambulatory Visit (INDEPENDENT_AMBULATORY_CARE_PROVIDER_SITE_OTHER): Admitting: Physician Assistant

## 2024-04-30 ENCOUNTER — Encounter: Payer: Self-pay | Admitting: Physician Assistant

## 2024-04-30 VITALS — BP 110/77 | HR 58 | Temp 97.9°F | Ht 61.0 in | Wt 106.8 lb

## 2024-04-30 DIAGNOSIS — K5000 Crohn's disease of small intestine without complications: Secondary | ICD-10-CM | POA: Diagnosis not present

## 2024-04-30 DIAGNOSIS — G894 Chronic pain syndrome: Secondary | ICD-10-CM | POA: Diagnosis not present

## 2024-04-30 DIAGNOSIS — R221 Localized swelling, mass and lump, neck: Secondary | ICD-10-CM | POA: Diagnosis not present

## 2024-04-30 DIAGNOSIS — F5101 Primary insomnia: Secondary | ICD-10-CM

## 2024-04-30 MED ORDER — HYDROXYZINE PAMOATE 25 MG PO CAPS: 25.0000 mg | ORAL_CAPSULE | Freq: Every evening | ORAL | 0 refills | Status: AC | PRN

## 2024-04-30 NOTE — Progress Notes (Signed)
 "  Established Patient Office Visit  Subjective   Patient ID: [name], female    DOB: 05/23/1960  Age: 64 y.o. 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  History of Present Illness [name] is a 64 year old female who presents for follow-up on thyroid  biopsy and management of chronic pain.  She is following up on a thyroid  nodule with a prior nondiagnostic biopsy and is needing to be scheduled for repeat biopsy. She asks if her hair thinning could be thyroid -related, though prior thyroid  function tests were normal.  She has chronic pain in her lower back, right shoulder, and neck. She reports prior injections did not provide lasting relief. Oxycodone -acetaminophen  10/325 mg has reportedly not controlled her pain well and causes her stomach to feel raw after 2 to 3 weeks of use. She notes blood in her stool, which she relates to Crohn's disease, and is under GI care with an upcoming appointment next week. She endorses mild lightheadedness and blurred vision with standing, contributing this to blood loss.   She has worsening sleep since alprazolam was discontinued by pain management. OTC sleep aids caused excessive morning grogginess and have not been effective. She reports sleeping  approximately 3 hours per night.     Review of Systems  Constitutional:  Negative for activity change, appetite change, fatigue and fever.  Eyes:  Positive for visual disturbance.  Respiratory:  Negative for cough and shortness of breath.   Cardiovascular:  Negative for chest pain.  Gastrointestinal:  Positive for blood in stool. Negative for diarrhea.  Musculoskeletal:  Positive for arthralgias, back pain and neck pain. Negative for gait problem and myalgias.  Neurological:  Positive for light-headedness. Negative for dizziness and headaches.  Psychiatric/Behavioral:  Positive for sleep disturbance.        Objective:     BP 110/77   Pulse (!) 58   Temp 97.9 F (36.6 C)   Ht 5' 1 (1.549 m)   Wt 106 lb 12.8 oz (48.4 kg)   SpO2 97%   BMI 20.18 kg/m    Physical Exam Constitutional:      General: She is not in acute distress.    Appearance: Normal appearance. She is not ill-appearing.  HENT:     Head: Normocephalic and atraumatic.     Mouth/Throat:     Mouth: Mucous membranes are moist.     Pharynx: Oropharynx is clear.  Eyes:     Extraocular Movements: Extraocular movements intact.     Conjunctiva/sclera: Conjunctivae normal.  Cardiovascular:     Rate and Rhythm: Normal rate and regular rhythm.  Heart sounds: Normal heart sounds. No murmur heard. Pulmonary:     Effort: Pulmonary effort is normal.     Breath sounds: Normal breath sounds. No wheezing, rhonchi or rales.  Musculoskeletal:     Right lower leg: No edema.     Left lower leg: No edema.  Skin:    General: Skin is warm and dry.  Neurological:     General: No focal deficit present.     Mental Status: She is alert and oriented to person, place, and time.  Psychiatric:        Mood and Affect: Mood normal.        Behavior: Behavior normal.     No results found for any visits on 04/30/24.  The ASCVD Risk score (Arnett DK, et al., 2019) failed to calculate for the following reasons:   Cannot find a  previous HDL lab   Cannot find a previous total cholesterol lab   * - Cholesterol units were assumed    Assessment & Plan:   Return in about 3 months (around 07/29/2024) for sleep.   Nodule of neck Assessment & Plan: Previous biopsy inconclusive. No change in size or symptoms. Reports increased hair loss recently.  - Ordered repeat biopsy of neck nodule. - Ordered thyroid  function tests.  Orders: -     US  FNA BX THYROID  1ST LESION AFIRMA -     TSH + free T4  Chronic pain syndrome Assessment & Plan: Current pain management ineffective and causes GI discomfort. Blood in stool unlikely related to pain medication, suspect Crohn's as cause. - Referred to pain management at [facility] in [place]. - Continue with current pain management provider until established with a new practice.   Orders: -     Ambulatory referral to Pain Clinic  Crohn's disease involving terminal ileum (HCC) Assessment & Plan: Recent rectal bleeding and dizziness likely related to Crohn's. Follow-up with GI ongoing. - CBC and iron panel today to evaluate for anemia.  - Continue [name]  for Crohn's disease. - Continue follow-up with GI specialists.  Orders: -     CBC -     Iron, TIBC and Ferritin Panel  Primary insomnia Assessment & Plan: Difficulty sleeping, exacerbated by discontinuation of alprazolam. Hydroxyzine  discussed as non-addictive alternative. - Prescribed hydroxyzine  25 mg at bedtime. Discussed potential morning grogginess.  - Instructed to increase hydroxyzine  to 50 mg if needed for sleep. - Handout provided on sleep hygiene and insomnia home management.  - Follow up in 3 months for re-evaluation.   Orders: -     hydrOXYzine  Pamoate; Take 1 capsule (25 mg total) by mouth at bedtime as needed.  Dispense: 45 capsule; Refill: 0   Zameer Borman, PA-C  "

## 2024-04-30 NOTE — Assessment & Plan Note (Signed)
 Current pain management ineffective and causes GI discomfort. Blood in stool unlikely related to pain medication, suspect Crohn's as cause. - Referred to pain management at [facility] in [place]. - Continue with current pain management provider until established with a new practice.

## 2024-04-30 NOTE — Patient Instructions (Signed)
 [place]  Centers for Disease Control and Prevention (CDC) and the American Academy of Pain Medicine recommend extreme caution when prescribing narcotic pain medications (such as tramadol, Percocet, hydrocodone , oxycodone ) and benzodiazepines (like Xanax  or lorazepam, or Klonopin) together due to the increased risk of serious harm, including overdose and death. Both drug classes depress the central nervous system and can increase each other's effects, especially by further decreasing respiratory drive, which can lead to life-threatening respiratory depression.  Studies have shown that simultaneous use of benzodiazepines and opioids is associated with a significantly higher risk of overdose death compared to opioids alone, with a study showing a near-quadrupling of risk. The risk is even greater with unpredictable use, higher dosages, or when combined with other substances such as alcohol.  The CDC notes that while there may be very rare situations where concurrent use is justified (such as severe acute pain in a patient on long-term, stable, low-dose benzodiazepine therapy), clinicians must carefully weigh the risks and benefits and discuss these with the patient. Abrupt discontinuation of benzodiazepines can be destabilizing and may cause withdrawal symptoms, so any changes should be made gradually and with close monitoring. In summary, the combination of narcotic pain medications and benzodiazepines is associated with a markedly increased risk of life-threatening adverse events, and should be avoided whenever possible.  As your primary care provider, my goal is to give you the best care I can while also keeping you safe. If you are on one or both of these medications you can expect we will have a conversation about the safety of use. As this office does not handle long term pain medications, you will be offered a referral to pain management for continued refills of narcotics. If you are also on a  benzodiazepine, we will talk about how to safely transition to a less risky medication. It is a pleasure to work with you, thank you for letting me be apart of you medical care.

## 2024-04-30 NOTE — Assessment & Plan Note (Signed)
 Difficulty sleeping, exacerbated by discontinuation of alprazolam. Hydroxyzine  discussed as non-addictive alternative. - Prescribed hydroxyzine  25 mg at bedtime. Discussed potential morning grogginess.  - Instructed to increase hydroxyzine  to 50 mg if needed for sleep. - Handout provided on sleep hygiene and insomnia home management.  - Follow up in 3 months for re-evaluation.

## 2024-04-30 NOTE — Assessment & Plan Note (Signed)
 Previous biopsy inconclusive. No change in size or symptoms. Reports increased hair loss recently.  - Ordered repeat biopsy of neck nodule. - Ordered thyroid  function tests.

## 2024-04-30 NOTE — Assessment & Plan Note (Signed)
 Recent rectal bleeding and dizziness likely related to Crohn's. Follow-up with GI ongoing. - CBC and iron panel today to evaluate for anemia.  - Continue [name]  for Crohn's disease. - Continue follow-up with GI specialists.

## 2024-05-01 ENCOUNTER — Ambulatory Visit: Payer: Self-pay | Admitting: Physician Assistant

## 2024-05-01 DIAGNOSIS — R946 Abnormal results of thyroid function studies: Secondary | ICD-10-CM

## 2024-05-01 LAB — TSH+FREE T4
Free T4: 0.75 ng/dL — ABNORMAL LOW (ref 0.82–1.77)
TSH: 4.85 u[IU]/mL — ABNORMAL HIGH (ref 0.450–4.500)

## 2024-05-01 LAB — CBC
Hematocrit: 39.5 % (ref 34.0–46.6)
Hemoglobin: 13.1 g/dL (ref 11.1–15.9)
MCH: 33.2 pg — ABNORMAL HIGH (ref 26.6–33.0)
MCHC: 33.2 g/dL (ref 31.5–35.7)
MCV: 100 fL — ABNORMAL HIGH (ref 79–97)
Platelets: 122 x10E3/uL — ABNORMAL LOW (ref 150–450)
RBC: 3.94 x10E6/uL (ref 3.77–5.28)
RDW: 13.6 % (ref 11.7–15.4)
WBC: 5.4 x10E3/uL (ref 3.4–10.8)

## 2024-05-01 LAB — IRON,TIBC AND FERRITIN PANEL
Ferritin: 113 ng/mL (ref 15–150)
Iron Saturation: 18 % (ref 15–55)
Iron: 53 ug/dL (ref 27–139)
Total Iron Binding Capacity: 296 ug/dL (ref 250–450)
UIBC: 243 ug/dL (ref 118–369)

## 2024-05-09 ENCOUNTER — Ambulatory Visit (HOSPITAL_COMMUNITY): Admission: RE | Admit: 2024-05-09 | Source: Ambulatory Visit

## 2024-05-10 ENCOUNTER — Other Ambulatory Visit (INDEPENDENT_AMBULATORY_CARE_PROVIDER_SITE_OTHER): Payer: Self-pay

## 2024-05-10 ENCOUNTER — Telehealth (INDEPENDENT_AMBULATORY_CARE_PROVIDER_SITE_OTHER): Payer: Self-pay

## 2024-05-10 ENCOUNTER — Encounter (INDEPENDENT_AMBULATORY_CARE_PROVIDER_SITE_OTHER): Payer: Self-pay | Admitting: Gastroenterology

## 2024-05-10 ENCOUNTER — Ambulatory Visit (INDEPENDENT_AMBULATORY_CARE_PROVIDER_SITE_OTHER): Admitting: Gastroenterology

## 2024-05-10 VITALS — BP 121/84 | HR 83 | Temp 98.0°F | Ht 62.0 in | Wt 105.8 lb

## 2024-05-10 DIAGNOSIS — K50011 Crohn's disease of small intestine with rectal bleeding: Secondary | ICD-10-CM

## 2024-05-10 DIAGNOSIS — R197 Diarrhea, unspecified: Secondary | ICD-10-CM | POA: Diagnosis not present

## 2024-05-10 DIAGNOSIS — Z1159 Encounter for screening for other viral diseases: Secondary | ICD-10-CM

## 2024-05-10 DIAGNOSIS — K5000 Crohn's disease of small intestine without complications: Secondary | ICD-10-CM

## 2024-05-10 DIAGNOSIS — K7469 Other cirrhosis of liver: Secondary | ICD-10-CM | POA: Diagnosis not present

## 2024-05-10 DIAGNOSIS — Z5181 Encounter for therapeutic drug level monitoring: Secondary | ICD-10-CM

## 2024-05-10 DIAGNOSIS — Z7960 Long term (current) use of unspecified immunomodulators and immunosuppressants: Secondary | ICD-10-CM | POA: Diagnosis not present

## 2024-05-10 DIAGNOSIS — Z1321 Encounter for screening for nutritional disorder: Secondary | ICD-10-CM | POA: Diagnosis not present

## 2024-05-10 NOTE — Patient Instructions (Signed)
 Continue skyrizi  for now Go ahead and complete stool testing Do blood work the day before your next skyrizi  dose Continue daily iron pill and vitamin D  Contnue coreg  3.125mg  twice daily for now We will get you scheduled for liver US  next month Reduce salt intake to <2 g per day Can take Tylenol  max of 2 g per day (650 mg q8h) for pain Avoid NSAIDs for pain Avoid eating raw oysters/shellfish Ensure every night before going to sleep  Follow up 6 weeks  It was a pleasure to see you today. I want to create trusting relationships with patients and provide genuine, compassionate, and quality care. I truly value your feedback! please be on the lookout for a survey regarding your visit with me today. I appreciate your input about our visit and your time in completing this!    Kenyah Luba L. Delainee Tramel, MSN, APRN, AGNP-C Adult-Gerontology Nurse Practitioner [facility]

## 2024-05-10 NOTE — Telephone Encounter (Signed)
 Spoke with patient in the office, scheduled RUQ US  for 06/08/2024 at 10:30 am. Patient aware and verbalized understanding.

## 2024-05-10 NOTE — Addendum Note (Signed)
 Addended by: DALLIE LIONEL RAMAN on: 05/10/2024 11:06 AM   Modules accepted: Orders

## 2024-05-10 NOTE — Progress Notes (Signed)
 "  Referring Provider: Grooms, Charmaine RIGGERS Primary Care Physician:  Grooms, Charmaine, [place] Primary GI Physician: Previously DR. Castaneda (Dr. Cinderella)  Chief Complaint  Patient presents with   Follow-up    Patient here today, due for a follow up on Crohn's. Patient wants to discuss changing her biologic from [name]  is not working as well as Humira . She has been having issues with lower abdominal pain and has had some tenderness in her lower abdomen. She reports prior hernia repairs in that area. She is also seeing bright red blood when she wipes at times.    HPI:   COLBI SCHILTZ is a 64 y.o. female with past medical history of  ileal Crohn's disease s/p small bowel resection, COPD, chronic pancreatitis, RA, GERD, suspected possible EPI, cirrhosis suspected due to prior 6-MP and full serologies negative for other etiologies, chronic biliary dilatation and seen in consultation by [facility]   Patient presenting today for follow up of: Ileal Crohn's disease Cirrhosis   Last seen in August, at that time she missed June skyrizi  dose as she let it sit out too long and could not get it to inject. Feeling pretty good prior to missing her dose. Having more diarrhea since but seeming to improve. Taking vitamin D  daily and iron BID, energy levels improving. GERD well controlled on protonix  40mg  daily, famotidine  20mg  daily. Appetite improving   Recommended US  for HCC, continue skyrizi  every 8 weeks, MELD labs, VIt D AFP, CRP calprotectin, consider colonoscopy at follow up, consider creon if weight continues to decline, continue iron BID, vit d  Labs in August Calprotectin 73  Vit d 45 Crp 4.9 Hgb 12.5, WBC 6.1  CMP WNL  AFP 5.1  INR 1.1   US  12/08/23 Cirrhotic liver morphology without focal hepatic lesion. 2. Dilated common bile duct measuring up to 17 mm is not significantly changed since ultrasound abdomen from 01/22/2015, which indicates a benign etiology.   Labs earlier this month with  iron 53 iron sat 18 ferritin 113 Hgb 13.1  TSH 4.85, T4 0.75   Present: States she does not feel that skyrizi  is working. She denies any missed doses. She started having some rectal bleeding about 2 weeks ago that at times was heavy in nature, seems to be tapering off some now. She is having diarrhea daily with watery stools and abdominal cramping. She states she cannot remember the last time she had a solid stool. She has gained a few pounds. Appetite comes and goes, some nausea at times. She notes a rash to arms and legs for about 1 year. Taking iron pill once daily.   She denies any swelling to abdomen or legs. No episodes of confusion or altered mental status.   03/21/25 last dose of skyrizi  (on skyrizi  since December 2024)   Extraintestinal Manifestations: Skin:  notes a pruritic rash to arms and legs  Joints: no joint swelling, has some joint pain   Eyes: wears glasses, some vision changes   Flu shot: declines Covid shots: declines Shingles vaccine: declines  Mammogram: April 2025 Pap smear: end of 2025  Sees eye doctor once per year Dermatology: never    Cirrhosis related questions: Episodes of confusion/disorientation: none  Taking diuretics? None  Beta blockers? Coreg  3.125mg  BID Prior history of variceal banding? None  Prior episodes of SBP? None  Last liver imaging: MR abd w wo 05/18/23 no gross liver lesions  Alcohol use: none  last AFP: August 2025 5.1   Last Colonoscopy:02/2023  -  Crohn's disease with ileitis. Inflammation was                            found. This was graded as Rutgeerts Score i2 (more                            than five aphthous lesions with normal intervening                            mucosa or skip areas of larger lesions or lesions                            confined to the ileocolonic anastomosis). Biopsied.                           - Stricture in the terminal ileum.                           - Patent end-to-side ileo-colonic anastomosis,                             characterized by friable mucosa.                           - Diverticulosis in the sigmoid colon and in the                            descending colon.                           - Patent end-to-side colo-colonic anastomosis,                            characterized by healthy appearing mucosa.                           - The distal rectum and anal verge are normal Last Endoscopy:02/2023  Grade I esophageal varices.                           - Normal stomach.                           - Normal examined duodenum.                           - No specimens collected.     Filed Weights   05/10/24 1016  Weight: 105 lb 12.8 oz (48 kg)     Past Medical History:  Diagnosis Date   Anemia    Anorexia 10/01/2010   Anxiety Mid 20's   Arthritis    Blood transfusion without reported diagnosis 05/2023   Chronic pain syndrome    bones and stomach   COPD (chronic obstructive pulmonary disease) (HCC)    Crohn's 10/01/2010   Diarrhea 10/01/2010   Emesis 10/01/2010   GERD (gastroesophageal reflux disease)    Lower abdominal pain 10/01/2010   Osteoporosis Early  30's   Pancreatitis    Rectal bleeding 10/01/2010   Seasonal allergies     Past Surgical History:  Procedure Laterality Date    Partial Hysterectomy  about 20 years ago   ABDOMINAL HYSTERECTOMY  Mis 2000   Partial   BIOPSY  06/23/2021   Procedure: BIOPSY;  Surgeon: Eartha Angelia Sieving, MD;  [facility]: [facility];  Service: Gastroenterology;;   BIOPSY  03/08/2023   Procedure: BIOPSY;  Surgeon: Eartha Angelia Sieving, MD;  [facility]: [facility];  Service: Gastroenterology;;   CHOLECYSTECTOMY     COLECTOMY  1995   x2   COLONOSCOPY  01/17/2006   October 2007 revealinga ileal ulcers proximal to anastomosis without stricture and a single rectal ulcer.   COLONOSCOPY WITH PROPOFOL  N/A 04/02/2016   Procedure: COLONOSCOPY WITH PROPOFOL ;  Surgeon: Claudis RAYMOND Rivet, MD;  Location: [facility];   Service: Endoscopy;  Laterality: N/A;  1:00   COLONOSCOPY WITH PROPOFOL  N/A 06/23/2021   Procedure: COLONOSCOPY WITH PROPOFOL ;  Surgeon: Eartha Angelia Sieving, MD;  Location: [facility];  Service: Gastroenterology;  Laterality: N/A;  805 ASA 1   COLONOSCOPY WITH PROPOFOL  N/A 03/08/2023   Procedure: COLONOSCOPY WITH PROPOFOL ;  Surgeon: Eartha Angelia Sieving, MD;  Location: [facility];  Service: Gastroenterology;  Laterality: N/A;  1:45PM;ASA 3   ESOPHAGOGASTRODUODENOSCOPY (EGD) WITH PROPOFOL  N/A 11/21/2013   Procedure: ESOPHAGOGASTRODUODENOSCOPY (EGD) WITH PROPOFOL ;  Surgeon: Claudis RAYMOND Rivet, MD;  Location: [facility];  Service: Endoscopy;  Laterality: N/A;   ESOPHAGOGASTRODUODENOSCOPY (EGD) WITH PROPOFOL  N/A 03/08/2023   Procedure: ESOPHAGOGASTRODUODENOSCOPY (EGD) WITH PROPOFOL ;  Surgeon: Eartha Angelia Sieving, MD;  Location: [facility];  Service: Gastroenterology;  Laterality: N/A;   HERNIA REPAIR     SMALL INTESTINE SURGERY  90's   TOOTH EXTRACTION Left 10/17/2013   left lower molar   TUBAL LIGATION  Late 80's    Current Outpatient Medications  Medication Sig Dispense Refill   carvedilol  (COREG ) 3.125 MG tablet TAKE ONE TABLET BY MOUTH TWICE DAILY WITH APPLY MEAL 180 tablet 1   famotidine  (PEPCID ) 20 MG tablet Take 1 tablet (20 mg total) by mouth daily. 90 tablet 3   hydrOXYzine  (VISTARIL ) 25 MG capsule Take 1 capsule (25 mg total) by mouth at bedtime as needed. 45 capsule 0   Magnesium 200 MG TABS Take by mouth. (Patient taking differently: Take by mouth daily at 6 (six) AM.)     Multiple Vitamin (MULTIVITAMIN WITH MINERALS) TABS tablet Take 1 tablet by mouth daily.     ondansetron  (ZOFRAN ) 4 MG tablet Take one (1) tablet po q 8 hrs pen nausea or vomiting. 90 tablet 0   oxyCODONE -acetaminophen  (PERCOCET) 10-325 MG tablet Take 1 tablet by mouth every 6 (six) hours as needed.     pantoprazole  (PROTONIX ) 40 MG tablet Take 1 tablet (40 mg total) by mouth daily. 30 tablet 3    [name]  360 MG/2.4ML SOCT STARTING AT WEEK 12 INJECT (360MG ) SUBCUTANEOUSLY ONCE EVERY 8 WEEKS. 2.4 mL 6   Turmeric (QC TUMERIC COMPLEX PO) Take by mouth daily at 6 (six) AM.     Vitamin D , Ergocalciferol , (DRISDOL) 1.25 MG (50000 UNIT) CAPS capsule Take 50,000 Units by mouth once a week.     No current facility-administered medications for this visit.    Allergies as of 05/10/2024   (No Known Allergies)    Social History   Socioeconomic History   Marital status: Divorced    Spouse name: Not on file   Number of children:  2   Years of education: Not on file   Highest education level: 10th grade  Occupational History   Occupation: disabled  Tobacco Use   Smoking status: Former    Current packs/day: 0.00    Average packs/day: 0.4 packs/day for 41.2 years (15.0 ttl pk-yrs)    Types: Cigarettes    Start date: 11/28/1980    Quit date: 11/29/2015    Years since quitting: 8.4    Passive exposure: Past   Smokeless tobacco: Never  Vaping Use   Vaping status: Never Used  Substance and Sexual Activity   Alcohol use: Never   Drug use: Never   Sexual activity: Not Currently    Birth control/protection: Abstinence, None    Comment: hyst  Other Topics Concern   Not on file  Social History Narrative   Not on file   Social Drivers of Health   Tobacco Use: Medium Risk (05/10/2024)   Patient History    Smoking Tobacco Use: Former    Smokeless Tobacco Use: Never    Passive Exposure: Past  Physicist, Medical Strain: Low Risk (03/23/2024)   Overall Financial Resource Strain (CARDIA)    Difficulty of Paying Living Expenses: Not very hard  Food Insecurity: No Food Insecurity (03/23/2024)   Epic    Worried About Radiation Protection Practitioner of Food in the Last Year: Never true    Ran Out of Food in the Last Year: Never true  Transportation Needs: No Transportation Needs (03/23/2024)   Epic    Lack of Transportation (Medical): No    Lack of Transportation (Non-Medical): No  Physical Activity:  Insufficiently Active (03/23/2024)   Exercise Vital Sign    Days of Exercise per Week: 3 days    Minutes of Exercise per Session: 20 min  Stress: Stress Concern Present (03/23/2024)   [employer] of Occupational Health - Occupational Stress Questionnaire    Feeling of Stress: To some extent  Social Connections: Moderately Integrated (03/23/2024)   Social Connection and Isolation Panel    Frequency of Communication with Friends and Family: More than three times a week    Frequency of Social Gatherings with Friends and Family: Three times a week    Attends Religious Services: More than 4 times per year    Active Member of Clubs or Organizations: Yes    Attends Banker Meetings: More than 4 times per year    Marital Status: Divorced  Depression (PHQ2-9): Medium Risk (04/30/2024)   Depression (PHQ2-9)    PHQ-2 Score: 10  Alcohol Screen: Low Risk (12/06/2023)   Alcohol Screen    Last Alcohol Screening Score (AUDIT): 0  Housing: Low Risk (03/23/2024)   Epic    Unable to Pay for Housing in the Last Year: No    Number of Times Moved in the Last Year: 0    Homeless in the Last Year: No  Utilities: Not At Risk (03/30/2024)   Epic    Threatened with loss of utilities: No  Health Literacy: Adequate Health Literacy (03/30/2024)   B1300 Health Literacy    Frequency of need for help with medical instructions: Never    Review of systems General: negative for malaise, night sweats, fever, chills, weight los Neck: Negative for lumps, goiter, pain and significant neck swelling Resp: Negative for cough, wheezing, dyspnea at rest CV: Negative for chest pain, leg swelling, palpitations, orthopnea GI: denies melena, hematochezia, nausea, vomiting, diarrhea, constipation, dysphagia, odyonophagia, early satiety or unintentional weight loss.  MSK: Negative for joint pain or  swelling, back pain, and muscle pain. Derm: Negative for itching or rash Psych: Denies depression, anxiety, memory  loss, confusion. No homicidal or suicidal ideation.  Heme: Negative for prolonged bleeding, bruising easily, and swollen nodes. Endocrine: Negative for cold or heat intolerance, polyuria, polydipsia and goiter. Neuro: negative for tremor, gait imbalance, syncope and seizures. The remainder of the review of systems is noncontributory.  Physical Exam: BP 121/84 (BP Location: Left Arm, Patient Position: Sitting, Cuff Size: Normal)   Pulse 83   Temp 98 F (36.7 C) (Temporal)   Ht 5' 2 (1.575 m)   Wt 105 lb 12.8 oz (48 kg)   BMI 19.35 kg/m  General:   Alert and oriented. No distress noted. Pleasant and cooperative.  Head:  Normocephalic and atraumatic. Eyes:  Conjuctiva clear without scleral icterus. Mouth:  Oral mucosa pink and moist. Good dentition. No lesions. Heart: Normal rate and rhythm, s1 and s2 heart sounds present.  Lungs: Clear lung sounds in all lobes. Respirations equal and unlabored. Abdomen:  +BS, soft, non-tender and non-distended. No rebound or guarding. No HSM or masses noted. Derm: No palmar erythema or jaundice Msk:  Symmetrical without gross deformities. Normal posture. Extremities:  Without edema. Neurologic:  Alert and  oriented x4 Psych:  Alert and cooperative. Normal mood and affect.  Invalid input(s): 6 MONTHS   ASSESSMENT: KATHALENE SPORER is a 64 y.o. female presenting today for follow up of Ileal Crohn's disease, Cirrhosis  Ileal Crohn's disease: Previously on Humira , colonoscopy in November 2024 with Crohn's ileitis graded as Rutgeerts Score i2,Stricture in the terminal ileum, Patent end-to-side ileo-colonic anastomosis, characterized by friable mucosa.  She was started on [name]  December 2024 and seemed to be doing well on this at last visit, however, today she is reporting rectal bleeding, diarrhea and abdominal cramping. She had previously missed a skyrizi  dose but reports compliance most recently. At this time will check inflammatory markers, stool  studies to rule in underlying infection and will check skyrizi  levels prior to next dose to ensure no antibodies and adequate dosing. Will need to consider colonscopy if the above testing is negative/skyrizi  levels are adequate. She is also due for TB and Hep B screening.  She has history of vitamin D  deficiency, will recheck vitamin D  levels today, continue supplementation.  She is up-to-date on DEXA scan as last was in March 2024 with presence of osteoporosis, will need repeat again in March either by us  or her PCP.   Cirrhosis:ppears well compensated with no signs of HE, ascites, LE edema.  MELD 3.0 last in February of 12. EGD as above with grade 1 EVs, started on carvedilol  3.125mg  BID which she seems to be tolerating well. Her HR is 83 today, not at goal (<60) though will keep her at this dose for now, if trend in HR remains above 60, will need to consider increase in dosage of NSBB. She should make me aware if any dizziness, lightheadedness or intolerance to NSBB. Will update MELD labs, HCC screening via US  and AFP next month.   PLAN:  -continue skyrizi  q8W -continue coreg  3.125mg  BID, consider increasing dose pending HR trend -US  for hcc screening due February -MELD labs, AFP, CBC due feb -Vitamin D  levels, continue supplementation  -CRP, calprotectin, skyrizi  levels and Ab  -C diff/GI pathogen panel  -consider colonoscopy if skyrizi  levels adequate, stool testing/inflammatory markers negative/normal - Reduce salt intake to <2 g per day - Can take Tylenol  max of 2 g per day (650 mg q8h)  for pain - Avoid NSAIDs for pain - Avoid eating raw oysters/shellfish - Ensure every night before going to sleep -consider updating DEXA scan in the next few months.   All questions were answered, patient verbalized understanding and is in agreement with plan as outlined above.    Follow Up: 6 weeks   Finnigan Warriner L. Tifany Hirsch, MSN, APRN, AGNP-C Adult-Gerontology Nurse Practitioner [facility]  "

## 2024-05-13 LAB — COMPREHENSIVE METABOLIC PANEL WITH GFR
AG Ratio: 1.3 (calc) (ref 1.0–2.5)
ALT: 24 U/L (ref 6–29)
AST: 47 U/L — ABNORMAL HIGH (ref 10–35)
Albumin: 3.9 g/dL (ref 3.6–5.1)
Alkaline phosphatase (APISO): 120 U/L (ref 37–153)
BUN: 8 mg/dL (ref 7–25)
CO2: 27 mmol/L (ref 20–32)
Calcium: 8.8 mg/dL (ref 8.6–10.4)
Chloride: 106 mmol/L (ref 98–110)
Creat: 0.72 mg/dL (ref 0.50–1.05)
Globulin: 3 g/dL (ref 1.9–3.7)
Glucose, Bld: 89 mg/dL (ref 65–99)
Potassium: 3.7 mmol/L (ref 3.5–5.3)
Sodium: 140 mmol/L (ref 135–146)
Total Bilirubin: 0.4 mg/dL (ref 0.2–1.2)
Total Protein: 6.9 g/dL (ref 6.1–8.1)
eGFR: 93 mL/min/{1.73_m2}

## 2024-05-13 LAB — CBC WITH DIFFERENTIAL/PLATELET
Absolute Lymphocytes: 1062 {cells}/uL (ref 850–3900)
Absolute Monocytes: 401 {cells}/uL (ref 200–950)
Basophils Absolute: 30 {cells}/uL (ref 0–200)
Basophils Relative: 0.5 %
Eosinophils Absolute: 83 {cells}/uL (ref 15–500)
Eosinophils Relative: 1.4 %
HCT: 37.1 % (ref 35.9–46.0)
Hemoglobin: 12 g/dL (ref 11.7–15.5)
MCH: 32.6 pg (ref 27.0–33.0)
MCHC: 32.3 g/dL (ref 31.6–35.4)
MCV: 100.8 fL (ref 81.4–101.7)
MPV: 12 fL (ref 7.5–12.5)
Monocytes Relative: 6.8 %
Neutro Abs: 4325 {cells}/uL (ref 1500–7800)
Neutrophils Relative %: 73.3 %
Platelets: 131 10*3/uL — ABNORMAL LOW (ref 140–400)
RBC: 3.68 Million/uL — ABNORMAL LOW (ref 3.80–5.10)
RDW: 13 % (ref 11.0–15.0)
Total Lymphocyte: 18 %
WBC: 5.9 10*3/uL (ref 3.8–10.8)

## 2024-05-13 LAB — QUANTIFERON-TB GOLD PLUS
Mitogen-NIL: 3.03 [IU]/mL
NIL: 0.02 [IU]/mL
QuantiFERON-TB Gold Plus: NEGATIVE
TB1-NIL: 0 [IU]/mL
TB2-NIL: 0 [IU]/mL

## 2024-05-13 LAB — HEPATITIS B SURFACE ANTIGEN: Hepatitis B Surface Ag: NONREACTIVE

## 2024-05-13 LAB — PROTIME-INR
INR: 1.2 — ABNORMAL HIGH
Prothrombin Time: 12.4 s — ABNORMAL HIGH (ref 9.0–11.5)

## 2024-05-13 LAB — AFP TUMOR MARKER: AFP-Tumor Marker: 4.7 ng/mL

## 2024-05-13 LAB — VITAMIN D 25 HYDROXY (VIT D DEFICIENCY, FRACTURES): Vit D, 25-Hydroxy: 23 ng/mL — ABNORMAL LOW (ref 30–100)

## 2024-05-13 LAB — C-REACTIVE PROTEIN: CRP: <3 mg/L

## 2024-05-16 ENCOUNTER — Ambulatory Visit (HOSPITAL_COMMUNITY): Admission: RE | Admit: 2024-05-16 | Source: Ambulatory Visit

## 2024-05-23 ENCOUNTER — Ambulatory Visit (HOSPITAL_COMMUNITY): Admission: RE | Admit: 2024-05-23 | Source: Ambulatory Visit

## 2024-05-24 LAB — CLOSTRIDIUM DIFFICILE TOXIN B, QUALITATIVE, REAL-TIME PCR: Toxigenic C. Difficile by PCR: NOT DETECTED

## 2024-05-24 LAB — C. DIFFICILE GDH AND TOXIN A/B
GDH ANTIGEN: DETECTED
MICRO NUMBER:: 17545740
SPECIMEN QUALITY:: ADEQUATE
TOXIN A AND B: NOT DETECTED

## 2024-05-24 LAB — GASTROINTESTINAL PATHOGEN PNL
CampyloBacter Group: NOT DETECTED
Norovirus GI/GII: NOT DETECTED
Rotavirus A: NOT DETECTED
Salmonella species: NOT DETECTED
Shiga Toxin 1: NOT DETECTED
Shiga Toxin 2: NOT DETECTED
Shigella Species: NOT DETECTED
Vibrio Group: NOT DETECTED
Yersinia enterocolitica: NOT DETECTED

## 2024-05-30 ENCOUNTER — Ambulatory Visit (HOSPITAL_COMMUNITY): Admission: RE | Admit: 2024-05-30 | Source: Ambulatory Visit

## 2024-06-08 ENCOUNTER — Ambulatory Visit (HOSPITAL_COMMUNITY)

## 2024-06-25 ENCOUNTER — Ambulatory Visit (INDEPENDENT_AMBULATORY_CARE_PROVIDER_SITE_OTHER): Admitting: Gastroenterology

## 2024-07-03 ENCOUNTER — Ambulatory Visit: Admitting: Physician Assistant

## 2024-07-30 ENCOUNTER — Ambulatory Visit: Admitting: Physician Assistant
